# Patient Record
Sex: Female | Born: 1950 | Race: White | Hispanic: No | Smoking: Never smoker
Health system: Southern US, Community
[De-identification: ages and names within clinical notes are randomized; demographics above are authoritative.]

## PROBLEM LIST (undated history)

## (undated) DIAGNOSIS — C801 Malignant (primary) neoplasm, unspecified: Secondary | ICD-10-CM

## (undated) DIAGNOSIS — I700 Atherosclerosis of aorta: Secondary | ICD-10-CM

## (undated) DIAGNOSIS — Z9013 Acquired absence of bilateral breasts and nipples: Secondary | ICD-10-CM

## (undated) DIAGNOSIS — I10 Essential (primary) hypertension: Secondary | ICD-10-CM

## (undated) DIAGNOSIS — I639 Cerebral infarction, unspecified: Secondary | ICD-10-CM

## (undated) HISTORY — DX: Atherosclerosis of aorta: I70.0

## (undated) HISTORY — DX: Essential (primary) hypertension: I10

## (undated) HISTORY — PX: ABDOMINAL HYSTERECTOMY: SHX81

## (undated) HISTORY — PX: BREAST SURGERY: SHX581

## (undated) HISTORY — DX: Cerebral infarction, unspecified: I63.9

## (undated) HISTORY — DX: Acquired absence of bilateral breasts and nipples: Z90.13

## (undated) HISTORY — DX: Malignant (primary) neoplasm, unspecified: C80.1

---

## 1998-09-29 ENCOUNTER — Emergency Department (HOSPITAL_COMMUNITY): Admission: EM | Admit: 1998-09-29 | Discharge: 1998-09-29 | Payer: Self-pay | Admitting: Family Medicine

## 1999-03-28 ENCOUNTER — Ambulatory Visit (HOSPITAL_BASED_OUTPATIENT_CLINIC_OR_DEPARTMENT_OTHER): Admission: RE | Admit: 1999-03-28 | Discharge: 1999-03-28 | Payer: Self-pay | Admitting: General Surgery

## 1999-04-18 ENCOUNTER — Ambulatory Visit (HOSPITAL_COMMUNITY): Admission: RE | Admit: 1999-04-18 | Discharge: 1999-04-18 | Payer: Self-pay | Admitting: Oncology

## 1999-04-18 ENCOUNTER — Encounter: Payer: Self-pay | Admitting: Oncology

## 1999-04-25 ENCOUNTER — Encounter: Payer: Self-pay | Admitting: General Surgery

## 1999-04-25 ENCOUNTER — Ambulatory Visit (HOSPITAL_COMMUNITY): Admission: RE | Admit: 1999-04-25 | Discharge: 1999-04-25 | Payer: Self-pay | Admitting: General Surgery

## 1999-05-22 ENCOUNTER — Encounter: Payer: Self-pay | Admitting: Hematology and Oncology

## 1999-05-22 ENCOUNTER — Emergency Department (HOSPITAL_COMMUNITY): Admission: EM | Admit: 1999-05-22 | Discharge: 1999-05-22 | Payer: Self-pay | Admitting: Emergency Medicine

## 1999-06-21 ENCOUNTER — Encounter: Payer: Self-pay | Admitting: Oncology

## 1999-06-21 ENCOUNTER — Inpatient Hospital Stay (HOSPITAL_COMMUNITY): Admission: AD | Admit: 1999-06-21 | Discharge: 1999-06-27 | Payer: Self-pay | Admitting: Oncology

## 1999-06-23 ENCOUNTER — Encounter: Payer: Self-pay | Admitting: Oncology

## 1999-07-08 ENCOUNTER — Inpatient Hospital Stay (HOSPITAL_COMMUNITY): Admission: AD | Admit: 1999-07-08 | Discharge: 1999-07-11 | Payer: Self-pay | Admitting: *Deleted

## 1999-10-11 ENCOUNTER — Encounter: Admission: RE | Admit: 1999-10-11 | Discharge: 2000-01-09 | Payer: Self-pay | Admitting: *Deleted

## 1999-11-07 ENCOUNTER — Encounter: Admission: RE | Admit: 1999-11-07 | Discharge: 2000-02-05 | Payer: Self-pay | Admitting: *Deleted

## 2000-03-26 ENCOUNTER — Encounter: Admission: RE | Admit: 2000-03-26 | Discharge: 2000-06-15 | Payer: Self-pay | Admitting: Anesthesiology

## 2000-06-13 ENCOUNTER — Encounter: Payer: Self-pay | Admitting: Oncology

## 2000-06-13 ENCOUNTER — Encounter: Admission: RE | Admit: 2000-06-13 | Discharge: 2000-06-13 | Payer: Self-pay | Admitting: Oncology

## 2000-06-14 ENCOUNTER — Ambulatory Visit (HOSPITAL_BASED_OUTPATIENT_CLINIC_OR_DEPARTMENT_OTHER): Admission: RE | Admit: 2000-06-14 | Discharge: 2000-06-14 | Payer: Self-pay | Admitting: General Surgery

## 2000-06-15 ENCOUNTER — Encounter: Admission: RE | Admit: 2000-06-15 | Discharge: 2000-09-13 | Payer: Self-pay | Admitting: Anesthesiology

## 2000-09-11 ENCOUNTER — Inpatient Hospital Stay (HOSPITAL_COMMUNITY): Admission: AD | Admit: 2000-09-11 | Discharge: 2000-09-13 | Payer: Self-pay | Admitting: *Deleted

## 2000-09-11 ENCOUNTER — Encounter: Admission: RE | Admit: 2000-09-11 | Discharge: 2000-12-10 | Payer: Self-pay | Admitting: Anesthesiology

## 2000-10-02 ENCOUNTER — Encounter: Admission: RE | Admit: 2000-10-02 | Discharge: 2000-10-02 | Payer: Self-pay | Admitting: Oncology

## 2000-10-02 ENCOUNTER — Encounter: Payer: Self-pay | Admitting: Oncology

## 2001-01-04 ENCOUNTER — Encounter: Admission: RE | Admit: 2001-01-04 | Discharge: 2001-04-04 | Payer: Self-pay | Admitting: Anesthesiology

## 2001-03-05 ENCOUNTER — Encounter: Admission: RE | Admit: 2001-03-05 | Discharge: 2001-05-31 | Payer: Self-pay | Admitting: Oncology

## 2001-04-11 ENCOUNTER — Encounter: Admission: RE | Admit: 2001-04-11 | Discharge: 2001-07-10 | Payer: Self-pay | Admitting: Anesthesiology

## 2001-06-10 ENCOUNTER — Encounter: Payer: Self-pay | Admitting: Oncology

## 2001-06-10 ENCOUNTER — Ambulatory Visit (HOSPITAL_COMMUNITY): Admission: RE | Admit: 2001-06-10 | Discharge: 2001-06-10 | Payer: Self-pay | Admitting: Oncology

## 2001-06-24 ENCOUNTER — Encounter: Payer: Self-pay | Admitting: Oncology

## 2001-06-24 ENCOUNTER — Encounter: Admission: RE | Admit: 2001-06-24 | Discharge: 2001-06-24 | Payer: Self-pay | Admitting: Oncology

## 2001-08-30 ENCOUNTER — Encounter: Payer: Self-pay | Admitting: Oncology

## 2001-08-30 ENCOUNTER — Other Ambulatory Visit: Admission: RE | Admit: 2001-08-30 | Discharge: 2001-08-30 | Payer: Self-pay | Admitting: Oncology

## 2001-08-30 ENCOUNTER — Encounter: Admission: RE | Admit: 2001-08-30 | Discharge: 2001-08-30 | Payer: Self-pay | Admitting: Oncology

## 2001-09-05 ENCOUNTER — Encounter: Admission: RE | Admit: 2001-09-05 | Discharge: 2001-09-05 | Payer: Self-pay | Admitting: Oncology

## 2001-09-05 ENCOUNTER — Encounter: Payer: Self-pay | Admitting: Oncology

## 2001-09-16 ENCOUNTER — Encounter: Payer: Self-pay | Admitting: Oncology

## 2001-09-16 ENCOUNTER — Ambulatory Visit (HOSPITAL_COMMUNITY): Admission: RE | Admit: 2001-09-16 | Discharge: 2001-09-16 | Payer: Self-pay | Admitting: Oncology

## 2001-09-20 ENCOUNTER — Ambulatory Visit (HOSPITAL_COMMUNITY): Admission: RE | Admit: 2001-09-20 | Discharge: 2001-09-20 | Payer: Self-pay | Admitting: General Surgery

## 2001-09-20 ENCOUNTER — Encounter: Payer: Self-pay | Admitting: General Surgery

## 2001-10-13 ENCOUNTER — Inpatient Hospital Stay (HOSPITAL_COMMUNITY): Admission: EM | Admit: 2001-10-13 | Discharge: 2001-10-15 | Payer: Self-pay | Admitting: Emergency Medicine

## 2001-10-13 ENCOUNTER — Encounter: Payer: Self-pay | Admitting: Emergency Medicine

## 2001-11-14 ENCOUNTER — Ambulatory Visit (HOSPITAL_COMMUNITY): Admission: RE | Admit: 2001-11-14 | Discharge: 2001-11-14 | Payer: Self-pay | Admitting: Oncology

## 2001-11-14 ENCOUNTER — Encounter: Payer: Self-pay | Admitting: Oncology

## 2001-11-15 ENCOUNTER — Ambulatory Visit (HOSPITAL_BASED_OUTPATIENT_CLINIC_OR_DEPARTMENT_OTHER): Admission: RE | Admit: 2001-11-15 | Discharge: 2001-11-15 | Payer: Self-pay | Admitting: General Surgery

## 2001-12-13 ENCOUNTER — Encounter: Payer: Self-pay | Admitting: General Surgery

## 2001-12-13 ENCOUNTER — Ambulatory Visit (HOSPITAL_COMMUNITY): Admission: RE | Admit: 2001-12-13 | Discharge: 2001-12-13 | Payer: Self-pay | Admitting: General Surgery

## 2001-12-25 DIAGNOSIS — Z853 Personal history of malignant neoplasm of breast: Secondary | ICD-10-CM

## 2001-12-25 HISTORY — DX: Personal history of malignant neoplasm of breast: Z85.3

## 2002-01-13 ENCOUNTER — Ambulatory Visit (HOSPITAL_COMMUNITY): Admission: RE | Admit: 2002-01-13 | Discharge: 2002-01-13 | Payer: Self-pay | Admitting: Oncology

## 2002-01-13 ENCOUNTER — Encounter: Payer: Self-pay | Admitting: Oncology

## 2002-07-03 ENCOUNTER — Encounter: Payer: Self-pay | Admitting: Oncology

## 2002-07-03 ENCOUNTER — Ambulatory Visit (HOSPITAL_COMMUNITY): Admission: RE | Admit: 2002-07-03 | Discharge: 2002-07-03 | Payer: Self-pay | Admitting: Oncology

## 2002-10-22 ENCOUNTER — Ambulatory Visit (HOSPITAL_COMMUNITY): Admission: RE | Admit: 2002-10-22 | Discharge: 2002-10-22 | Payer: Self-pay | Admitting: Oncology

## 2002-10-22 ENCOUNTER — Encounter: Payer: Self-pay | Admitting: Oncology

## 2002-11-25 ENCOUNTER — Encounter: Payer: Self-pay | Admitting: Oncology

## 2002-11-25 ENCOUNTER — Ambulatory Visit (HOSPITAL_COMMUNITY): Admission: RE | Admit: 2002-11-25 | Discharge: 2002-11-25 | Payer: Self-pay | Admitting: Oncology

## 2003-01-01 ENCOUNTER — Encounter: Payer: Self-pay | Admitting: Oncology

## 2003-01-01 ENCOUNTER — Encounter: Admission: RE | Admit: 2003-01-01 | Discharge: 2003-01-01 | Payer: Self-pay | Admitting: Oncology

## 2003-01-02 ENCOUNTER — Encounter: Payer: Self-pay | Admitting: Oncology

## 2003-01-02 ENCOUNTER — Ambulatory Visit (HOSPITAL_COMMUNITY): Admission: RE | Admit: 2003-01-02 | Discharge: 2003-01-02 | Payer: Self-pay | Admitting: Oncology

## 2003-01-27 ENCOUNTER — Encounter: Payer: Self-pay | Admitting: Oncology

## 2003-01-27 ENCOUNTER — Ambulatory Visit (HOSPITAL_COMMUNITY): Admission: RE | Admit: 2003-01-27 | Discharge: 2003-01-27 | Payer: Self-pay | Admitting: Oncology

## 2003-02-13 ENCOUNTER — Ambulatory Visit: Admission: RE | Admit: 2003-02-13 | Discharge: 2003-03-09 | Payer: Self-pay | Admitting: *Deleted

## 2003-03-11 ENCOUNTER — Ambulatory Visit: Admission: RE | Admit: 2003-03-11 | Discharge: 2003-06-07 | Payer: Self-pay | Admitting: *Deleted

## 2003-10-25 ENCOUNTER — Emergency Department (HOSPITAL_COMMUNITY): Admission: EM | Admit: 2003-10-25 | Discharge: 2003-10-25 | Payer: Self-pay | Admitting: Emergency Medicine

## 2003-11-07 ENCOUNTER — Emergency Department (HOSPITAL_COMMUNITY): Admission: EM | Admit: 2003-11-07 | Discharge: 2003-11-07 | Payer: Self-pay | Admitting: Emergency Medicine

## 2003-12-11 ENCOUNTER — Inpatient Hospital Stay (HOSPITAL_COMMUNITY): Admission: EM | Admit: 2003-12-11 | Discharge: 2003-12-16 | Payer: Self-pay | Admitting: Oncology

## 2004-05-30 ENCOUNTER — Inpatient Hospital Stay (HOSPITAL_COMMUNITY): Admission: RE | Admit: 2004-05-30 | Discharge: 2004-06-04 | Payer: Self-pay | Admitting: Psychiatry

## 2004-11-13 ENCOUNTER — Ambulatory Visit: Payer: Self-pay | Admitting: Oncology

## 2005-03-17 ENCOUNTER — Ambulatory Visit: Payer: Self-pay | Admitting: Oncology

## 2005-06-05 ENCOUNTER — Ambulatory Visit: Payer: Self-pay | Admitting: Oncology

## 2005-06-26 ENCOUNTER — Ambulatory Visit (HOSPITAL_COMMUNITY): Admission: RE | Admit: 2005-06-26 | Discharge: 2005-06-26 | Payer: Self-pay | Admitting: Plastic Surgery

## 2005-06-26 ENCOUNTER — Ambulatory Visit (HOSPITAL_BASED_OUTPATIENT_CLINIC_OR_DEPARTMENT_OTHER): Admission: RE | Admit: 2005-06-26 | Discharge: 2005-06-27 | Payer: Self-pay | Admitting: Plastic Surgery

## 2005-09-20 ENCOUNTER — Ambulatory Visit: Payer: Self-pay | Admitting: Oncology

## 2005-09-22 ENCOUNTER — Emergency Department (HOSPITAL_COMMUNITY): Admission: EM | Admit: 2005-09-22 | Discharge: 2005-09-23 | Payer: Self-pay | Admitting: Emergency Medicine

## 2005-10-02 ENCOUNTER — Ambulatory Visit (HOSPITAL_COMMUNITY): Admission: RE | Admit: 2005-10-02 | Discharge: 2005-10-02 | Payer: Self-pay | Admitting: Oncology

## 2005-11-07 ENCOUNTER — Emergency Department (HOSPITAL_COMMUNITY): Admission: EM | Admit: 2005-11-07 | Discharge: 2005-11-07 | Payer: Self-pay | Admitting: Emergency Medicine

## 2005-11-09 ENCOUNTER — Inpatient Hospital Stay (HOSPITAL_COMMUNITY): Admission: AD | Admit: 2005-11-09 | Discharge: 2005-11-12 | Payer: Self-pay | Admitting: Internal Medicine

## 2005-11-10 ENCOUNTER — Ambulatory Visit: Payer: Self-pay | Admitting: Cardiology

## 2005-11-10 ENCOUNTER — Encounter: Payer: Self-pay | Admitting: Cardiology

## 2005-11-24 ENCOUNTER — Ambulatory Visit: Payer: Self-pay | Admitting: Oncology

## 2006-03-21 ENCOUNTER — Ambulatory Visit: Payer: Self-pay | Admitting: Oncology

## 2006-09-18 ENCOUNTER — Ambulatory Visit: Payer: Self-pay | Admitting: Oncology

## 2006-10-18 ENCOUNTER — Ambulatory Visit (HOSPITAL_COMMUNITY): Admission: RE | Admit: 2006-10-18 | Discharge: 2006-10-18 | Payer: Self-pay | Admitting: Oncology

## 2006-10-31 ENCOUNTER — Ambulatory Visit: Payer: Self-pay | Admitting: Oncology

## 2007-01-09 ENCOUNTER — Ambulatory Visit: Payer: Self-pay | Admitting: Oncology

## 2007-03-28 ENCOUNTER — Inpatient Hospital Stay (HOSPITAL_COMMUNITY): Admission: EM | Admit: 2007-03-28 | Discharge: 2007-03-31 | Payer: Self-pay | Admitting: Oncology

## 2007-03-28 ENCOUNTER — Ambulatory Visit: Payer: Self-pay | Admitting: Oncology

## 2007-04-04 ENCOUNTER — Ambulatory Visit: Payer: Self-pay | Admitting: Oncology

## 2007-04-04 LAB — CBC WITH DIFFERENTIAL/PLATELET
BASO%: 0.8 % (ref 0.0–2.0)
Basophils Absolute: 0 10*3/uL (ref 0.0–0.1)
EOS%: 4.7 % (ref 0.0–7.0)
HCT: 31 % — ABNORMAL LOW (ref 34.8–46.6)
HGB: 10.3 g/dL — ABNORMAL LOW (ref 11.6–15.9)
LYMPH%: 21.5 % (ref 14.0–48.0)
Platelets: 317 10*3/uL (ref 145–400)
lymph#: 1.1 10*3/uL (ref 0.9–3.3)

## 2007-04-04 LAB — CHCC SMEAR

## 2007-06-13 ENCOUNTER — Ambulatory Visit: Payer: Self-pay | Admitting: Oncology

## 2007-07-08 LAB — CBC & DIFF AND RETIC
BASO%: 0.5 % (ref 0.0–2.0)
Basophils Absolute: 0 10*3/uL (ref 0.0–0.1)
EOS%: 1.5 % (ref 0.0–7.0)
HGB: 11.2 g/dL — ABNORMAL LOW (ref 11.6–15.9)
LYMPH%: 16.6 % (ref 14.0–48.0)
MCH: 32.6 pg (ref 26.0–34.0)
MCHC: 33.5 g/dL (ref 32.0–36.0)
NEUT#: 4.7 10*3/uL (ref 1.5–6.5)
NEUT%: 76 % (ref 39.6–76.8)
RBC: 3.43 10*6/uL — ABNORMAL LOW (ref 3.70–5.32)
RDW: 14.8 % — ABNORMAL HIGH (ref 11.3–14.5)
WBC: 6.2 10*3/uL (ref 3.9–10.0)
lymph#: 1 10*3/uL (ref 0.9–3.3)

## 2007-07-08 LAB — VITAMIN B12: Vitamin B-12: 508 pg/mL (ref 211–911)

## 2007-07-08 LAB — RETICULOCYTES (CHCC)
ABS Retic: 43.7 10*3/uL (ref 19.0–186.0)
Retic Ct Pct: 1.2 % (ref 0.4–3.1)

## 2007-07-08 LAB — FERRITIN: Ferritin: 58 ng/mL (ref 10–291)

## 2007-08-01 ENCOUNTER — Inpatient Hospital Stay (HOSPITAL_COMMUNITY): Admission: EM | Admit: 2007-08-01 | Discharge: 2007-08-06 | Payer: Self-pay | Admitting: Emergency Medicine

## 2007-08-23 ENCOUNTER — Inpatient Hospital Stay (HOSPITAL_COMMUNITY): Admission: EM | Admit: 2007-08-23 | Discharge: 2007-08-28 | Payer: Self-pay | Admitting: Emergency Medicine

## 2007-08-27 ENCOUNTER — Encounter (INDEPENDENT_AMBULATORY_CARE_PROVIDER_SITE_OTHER): Payer: Self-pay | Admitting: *Deleted

## 2007-08-27 ENCOUNTER — Ambulatory Visit: Payer: Self-pay | Admitting: Internal Medicine

## 2007-09-03 ENCOUNTER — Encounter (INDEPENDENT_AMBULATORY_CARE_PROVIDER_SITE_OTHER): Payer: Self-pay | Admitting: Family Medicine

## 2007-09-03 ENCOUNTER — Ambulatory Visit: Payer: Self-pay | Admitting: Vascular Surgery

## 2007-09-03 ENCOUNTER — Ambulatory Visit: Admission: RE | Admit: 2007-09-03 | Discharge: 2007-09-03 | Payer: Self-pay | Admitting: Family Medicine

## 2007-09-19 ENCOUNTER — Ambulatory Visit: Payer: Self-pay | Admitting: Oncology

## 2007-11-08 ENCOUNTER — Ambulatory Visit: Payer: Self-pay | Admitting: Oncology

## 2008-01-23 ENCOUNTER — Ambulatory Visit: Payer: Self-pay | Admitting: Oncology

## 2011-05-09 NOTE — Discharge Summary (Signed)
NAMEFLORIENE, JESCHKE NO.:  [id number]   MEDICAL RECORD NO.:  [id number]          PATIENT TYPE:  INP   LOCATION:  1412                         FACILITY:  [facility]   PHYSICIAN:  Hettie Holstein, D.O.    DATE OF BIRTH:  1951/04/20   DATE OF ADMISSION:  08/23/2007  DATE OF DISCHARGE:  08/27/2007                               DISCHARGE SUMMARY   REFERRAL PHYSICIAN:  Pearson Grippe, M.D.   She has no primary care physician, though I am referring her to Dr.  Pearson Grippe of [facility].  She has an appointment with  Dr. Selena Batten on Friday, August 30, 2007, at 10:30 a.m.  She will be  followed by [facility] for home IV antibiotics.   FINAL DIAGNOSIS:  Right arm cellulitis, with good resolution on  inpatient course of antibiotics.   SECONDARY DIAGNOSES:  1. Methicillin-resistant Staphylococcus aureus bacteremia, with      suboptimal treatment course.  At this juncture, we are going to      complete a total of 28 days of therapy.  She has had a negative      transesophageal echocardiogram for valvular vegetations or evidence      of endocarditis, and we are recommending a total of 2 weeks of      Ancef to conclude on September 05, 2007, 1 gram IV q.8 h. and then      to conclude therapy with Levaquin 500 mg daily until September 19, 2007, and she should undergo subsequent clinical followup with her      new primary care physician with Dr. Pearson Grippe.  2. History of inflammatory breast cancer and bilateral mastectomies in      the past and multiple episodes of cellulitis and lymphedema, status      post mastectomy.   MEDICATIONS ON DISCHARGE:  Ms. Macdowell is instructed to resume her pre-  hospital medications including:  1. Colace 100 mg p.o. b.i.d. over-the-counter with instructions to      hold if she develops diarrhea.  2. She had been additionally on lisinopril 10 mg daily, which she can      also resume.  3. She was prescribed 56 tablets  of oxycodone by Dr. Brien Few on August 06, 2007, and she should follow up with her primary physician for all      subsequent narcotic refills.   STUDIES PERFORMED THIS ADMISSION:  She does have negative blood cultures  drawn on August 24, 2007.  So far to date sodium was 138, potassium 39,  BUN 7, creatinine 0.56.  CBC revealed a WBC of 3.2, hemoglobin 11.8,  platelet count 273,000.  Her TEE was as described above.  No evidence of  endocarditis.  Left ventricular fraction was estimated at 45% to 50%.  There was a possible PFO, but no shunt at rest.  Report was read by Dr.  Gala Romney.  This is the preliminary report.  The final report is  currently pending, and I have asked that this be followed up by new  primary care physician,  Dr. Selena Batten.   DISPOSITION:  Ms. Scadden is felt to be stable for discharge in the  morning when home IV antibiotics can be arranged.  She can continue her  home IV antibiotic course until September 05, 2007, to conclude with the  Levaquin on September 19, 2007.  I have asked that [place] follow her,  also have a basic metabolic panel checked 1 week following discharge.   HISTORY OF PRESENT ILLNESS:  For full details, please refer to the  history and physical.  However, briefly, Ms. Osorto is a 60 year old  female recently seen in the hospital with right arm cellulitis.  She has  a history of inflammatory breast cancer, status post chemotherapy and  radiation with a mastectomy.  She had had metastatic disease in her left  axilla and chest in 2002 requiring chemotherapy with left mastectomy and  lymph node dissection.  She has had recurrent right arm and chest wall  cellulitis with admissions in 2002, December 2004, November 2006, April  2008.  She has a history of chronic pain syndrome, status post total  abdominal hysterectomy, with bilateral salpingo-oophorectomy.  She has  had liposuction in August 2006, history of major depression, history of   benzodiazepine and opiate abuse dependence requiring inpatient  treatment, which has reportedly resolved.   Ms. Kysar had previously been hospitalized and treated for  cellulitis in early August 2008.  She was felt to have simple cellulitis  and treated with oral therapy.  Subsequently, however, her blood  cultures have revealed methicillin-resistant Staphylococcus aureus; and  due to concerns for Staph bacteremia, appropriate course of IV  antibiotics for at least 14 days and rule out endocarditis was pursued.  She is felt to be medically stable at this time, and she has been set up  with an outpatient primary care physician.      Hettie Holstein, D.O.  Electronically Signed     ESS/MEDQ  D:  08/27/2007  T:  08/27/2007  Job:  [id number]   cc:   Massie Maroon, MD  Fax: [phone]   Leighton Roach. Truett Perna, M.D.  Fax: [phone]

## 2011-05-09 NOTE — Discharge Summary (Signed)
Connie Meyer, PARTRIDGE NO.:  [id number]   MEDICAL RECORD NO.:  [id number]          PATIENT TYPE:  INP   LOCATION:  1318                         FACILITY:  [facility]   PHYSICIAN:  Isidor Holts, M.D.  DATE OF BIRTH:  July 25, 1951   DATE OF ADMISSION:  07/31/2007  DATE OF DISCHARGE:  08/06/2007                               DISCHARGE SUMMARY   PRIMARY CARE PHYSICIAN:  Unassigned.   DISCHARGE DIAGNOSES:  1. Recurrent right upper extremity cellulitis, secondary to      methicillin sensitive Staphylococcus aureus.  2. History of breast cancer, status post bilateral mastectomy.  3. Chronic pain syndrome.  4. Normocytic anemia.  5. History of depression.  6. Hypertension.   DISCHARGE MEDICATIONS:  1. Keflex 500 mg p.o. twice daily for 10 days only.  2. Oxycodone 5 mg, two pills p.o. q.6h. p.r.n., for one week only.  A      total of #56 pills have been dispensed.  3. Lisinopril 10 mg p.o. daily.  4. Colace 100 mg p.o. twice daily, over-the-counter.   PROCEDURE:  None.   CONSULTATIONS:  None.   HISTORY:  As per the H&P notes of July 31, 2007, dictated by Dr.  Lonia Blood.  In brief, this 60 year old female with a known  history of inflammatory breast cancer of the right breast in April 2000,  status post chemotherapy and radiation therapy with a mastectomy, with  subsequent metastatic disease to the left axilla and chest in 2002,  requiring chemotherapy with a left mastectomy and left node dissection,  recurrent right arm and chest wall cellulitis with multiple previous  admissions for the same, chronic pain syndrome, chronic normocytic  anemia, previous abdominal hysterectomy and bilateral salpingo-  oophorectomy, history of major depression with a prior history of  diazepam and opioid abuse/dependence, who presents with a one-day  history of rapidly-advancing redness and some swelling in the right  upper extremity.  She was admitted for further evaluation,  investigation  and management.   CLINICAL COURSE:  #1 - RIGHT UPPER EXTREMITY CELLULITIS:  This is  recurrent, and has in the past, been due to Streptococcus.  She was  therefore commenced on broad-spectrum antibiotic coverage with a  combination of Vancomycin and Rocephin.  The white cell count was  elevated at 12,000.  Her temperature was 101.5 degrees.  The patient  demonstrated a dramatic clinical response to antibiotic treatment, and  by August 02, 2007, only minimal redness remained, although there was  still some residual soreness and swelling.  Blood cultures were of  course sent off and subsequently grew methicillin-sensitive  Staphylococcus aureus.  We were therefore able to transition the patient  to monotherapy with Rocephin, with continued clinical improvement.  By  August 06, 2007, only minimal residual inflammatory phenomena remained.  The patient was therefore switched to oral Keflex for an anticipated  further 10- day course of antibiotic treatment.   #2 - HYPERTENSION:  The patient has had borderline elevated blood  pressures during the course of this hospitalization, ranging between 147  systolic, to 159.  She has no prior known history of hypertension.  She  has been therefore recommended a heart-healthy diet and commenced on low-  dose Lisinopril.  She has been recommended to establish followup with a  primary MD for monitoring of blood pressure, as well as routine  preventative care.   #3 - HISTORY OF CHRONIC PAIN SYNDROME:  There were no symptoms referable  to this, during the course of the patient's hospitalization.   #4 - HISTORY OF DEPRESSION:  The patient mood remained stable, during  the course of her hospitalization.   #5 - HISTORY OF NORMOCYTIC ANEMIA:  The patient's hemoglobin remained  stable at about 9.4, during the course of this hospitalization.   DISPOSITION:  The patient was considered sufficiently recovered  clinically to be discharged on  August 06, 2007, to complete a further 10-  day course of oral antibiotic treatment.  She did have problems with  constipation during this hospitalization, secondary to opioid  medication, but this has been addressed with laxatives and Fleet's  enema.   DISCHARGE INSTRUCTIONS:  She is recommended to return to work on August 12, 2007.   DIET:  Heart-healthy diet.   ACTIVITY:  As tolerated.   FOLLOW-UP INSTRUCTIONS:  The patient is recommended to establish  followup with a primary MD for routine and preventative care.  All of  this has been communicated to the patient.  She has verbalized  understanding.      Isidor Holts, M.D.  Electronically Signed     CO/MEDQ  D:  08/06/2007  T:  08/06/2007  Job:  [id number]

## 2011-05-09 NOTE — H&P (Signed)
Connie Meyer, Connie Meyer NO.:  [id number]   MEDICAL RECORD NO.:  [id number]          PATIENT TYPE:  INP   LOCATION:  1412                         [facility]ACILITY:  [facility]   PHYSICIAN:  Altha Harm, MDDATE OF BIRTH:  March 27, 1951   DATE OF ADMISSION:  08/23/2007  DATE OF DISCHARGE:                              HISTORY & PHYSICAL   CHIEF COMPLAINT:  Swelling, warmth and redness in the right upper  extremity.   HISTORY OF PRESENT ILLNESS:  This is a 60 year old lady who is status  post bilateral mastectomies for inflammatory breast cancer and has a  history of recurrent right upper extremity edema syndrome, who presents  to the emergency room with complaints of warmth, swelling and erythema.  According to the patient, due to her previous diagnoses of cellulitis in  the past, she was very concerned that this was a recurring cellulitis  and came to the emergency room.  The patient was recently discharged  from the hospital on August 06, 2007.  She states that since her  discharge from the hospital her right upper extremity never really  resolved completely and then started having warmth, swelling and pain  again.  The patient states that she has had some tactile fevers,  however, they are no measured temperatures.  In the emergency room the  patient had no fevers.  The patient has not recently taken any  antipyretics.  The patient's description of her initial presentation is  very vague and nonspecific.  However, she states that she had some  ciprofloxacin which has been prescribed for her approximately one month  ago and she took 1500 mg last night to try to abate the symptoms and  then subsequent to that started having some emesis.  The patient has had  no further emesis since her episode last night.  We are asked to see the  patient for admission.  Confounding the picture is that the patient does  not have a primary care physician who follows her and it is unclear as  to whether or not her prior episodes were truly cellulitis or whether  they were just arm edema syndrome in a patient status post mastectomy.  The patient, on her last admission, did have a cellulitis with  documented blood culture of MSSA and the only other positive documented  blood culture was in 2004 when she had Strep agalactiae.  The patient  has had multiple hospitalizations in the interim with diagnosis of  cellulitis, however, she has had no positive cultures.  In light of the  fact that the patient does have a history of breast cancer, and the  patient does not have a primary care physician, and she does have  findings of mild warmth, erythema and redness, I am admitting the  patient on an observation basis.  The patient has received a dose of  Unasyn while in the emergency room and blood cultures have been drawn.  However, I am going to de-escalate her therapy and actually put her on  her old antibiotic and observe her over the next 24 hours. If the  patient has defervescence of  her symptoms, it is very reasonable that  the patient can be sent out on an oral antibiotic for a reasonable  course of therapy and then have followup with a primary care physician  of her choice.   PAST MEDICAL HISTORY:  1. Significant for inflammatory breast cancer and for bilateral      mastectomies in the past.  2. Multiple episodes of cellulitis versus edema syndrome in a patient      status post mastectomy.   FAMILY HISTORY:  Nonsignificant.   SOCIAL HISTORY:  The patient is married.  She lives with her children.  She does have  a history in the remote past of dependency upon narcotics  which she states she went through a detox process.   She also has a history of chronic normocytic anemia.  She has chronic  pain syndrome.  History of major depression.  History of benzodiazepine  and opioid abuse/dependence requiring inpatient treatment and  detoxification.   PAST SURGICAL HISTORY:  She is  status post total abdominal hysterectomy  with bilateral salpingo-oophorectomy.  Status post tonsillectomy and  adenoidectomy.  Status post liposuction in August of 2006.   CURRENT MEDICATIONS:  None.   ALLERGIES:  No known drug allergies.   PRIMARY CARE PHYSICIAN:  None at this time.   REVIEW OF SYSTEMS:  The patient denies any headache, any dizziness, any  blurry vision, any loss of consciousness, seizure activity.  She denies  any difficulty with eating, any difficulty with swallowing, any mouth  pain.  Patient denies any shortness of breath, cough, difficulty in  breathing.  She denies any chest pain, palpitations, except as noted in  the HPI, associated with the swelling and redness of the upper  extremities.  She denies any abdominal pain, any diarrhea, however, as  noted in the HPI, she does have some nausea and vomiting.  The patient  denies any dysuria, frequency, urgency.  She denies any weakness or  focal problems.  All other systems are negative except as noted in the  HPI.   LABORATORY DATA:  In the emergency room the patient received a dose of  Unasyn and blood cultures were sent.  Subsequent to that the patient had  a CBC which showed a white blood cell count of 3.2, hemoglobin 11.8,  hematocrit of 34.6, platelet count of 273,000.  Sodium was 138,  potassium 3.9, chloride 106, bicarb 27, BUN 7, creatinine 0.56.   PHYSICAL EXAMINATION:  GENERAL APPEARANCE:  The patient is lying in bed  in no acute distress.  VITAL SIGNS: Her temperature is 97.9.  Heart rate is 82.  Blood pressure  153/92.  Respiratory rate 13.  Oxygen saturation 99% on room air.  HEENT:  She is normocephalic, atraumatic.  No icterus.  No conjunctival  pallor.  Pupils equal, round, reactive to light and accommodation.  Extraocular movements are intact.  Oropharynx is moist.  No exudates,  erythema or lesions are noted.  NECK:  Trachea is midline.  No masses, no thyromegaly, no JVD, no  carotid  bruits.  LYMPH NODES SURVEY:  The patient has no cervical, axillary, inguinal  lymphadenopathy noted.  LUNG:  On exam she has equal excursion bilaterally.  Lungs are clear to  auscultation.  No increased vocal fremitus.  CHEST:  Chest wall shows evidence of bilateral mastectomies.  CARDIOVASCULAR:  She has normal S1 and S2.  There are no murmurs, rubs  or gallops noted.  PMI is not displaced.  No heaves or thrills  on  palpation.  ABDOMEN:  Mildly obese.  Normal, active bowel sounds.  Soft, nontender,  nondistended.  No masses, no hepatosplenomegaly.   ASSESSMENT/PLAN:  This is a patient who presents with right upper  extremity edema and pain.  The patient has only very minimal erythema as  noted.  It is unclear as to whether or not this is truly cellulitis or  just a right upper extremity arm edema syndrome status post mastectomy.  Since the patient has no primary care physician with which to follow up,  we will bring the patient in on an observation basis.  The pt had MSSA  on her last admission and it it unclear if this was fully and  appropriatly treated thus the pt may warrant TEE adn IV antibiotics if  in fact this was not done on last admission. This information cannot be  obtained at this time and eill leave this up to the treating Physician  to prusue. At this time I do not think that the patient's degree of  illness warrants IV antibiotics and I will place the patient on oral  antibiotics and observe her over the next 24 hours.  If the issue of  appropriate treatment of  MSSA is resolved andn  the patient has a de-escalation of symptoms, she  can certainly be discharged on an oral antibiotic.  If she does have an  escalation of symptoms, then at that time the therapy can be escalated  to IV commensurate with her clinical course.      Altha Harm, MD  Electronically Signed     MAM/MEDQ  D:  08/23/2007  T:  08/24/2007  Job:  [id number]

## 2011-05-09 NOTE — H&P (Signed)
Connie Meyer, Connie Meyer NO.:  [id number]   MEDICAL RECORD NO.:  [id number]          PATIENT TYPE:  INP   LOCATION:  0102                         FACILITY:  [facility]   PHYSICIAN:  Lonia Blood, M.D.DATE OF BIRTH:  1951/08/20   DATE OF ADMISSION:  07/31/2007  DATE OF DISCHARGE:                              HISTORY & PHYSICAL   PRIMARY CARE PHYSICIAN:  Unassigned.   CHIEF COMPLAINT:  Right arm redness.   HISTORY OF PRESENT ILLNESS:  [name] is a very pleasant 60-  year-old female with a history of bilateral mastectomy secondary to  breast cancer with metastatic recurrence.  She has a history of multiple  prior admissions for right arm and chest wall cellulitis/lymphangitis.  She states that she has been in her usual state of health.  She does  recall playing with her dog in the last few days and possibly being  scratched in her right arm.  Today at approximately 3 in the afternoon,  she experienced a rapid onset of right upper extremity erythema at or  around the elbow and spreading up the arm onto the chest wall.  With  this was an associated subjective sense of fever and nausea.  There has  been no vomiting.  There have been no other symptoms whatsoever.  Given  the patient's history, she was concerned that this likely represented a  recurrence of cellulitis and presented to a local urgent care center, at  which time she was transferred to [facility] emergency room.   REVIEW OF SYSTEMS:  Comprehensive review of systems is unremarkable with  the exception to the positive elements noted in the history of present  illness above.   PAST MEDICAL HISTORY:  1. Breast cancer.      a.     Inflammatory breast cancer, right breast, April, 2000,       status post chemo and radiation with mastectomy.      b.     Metastatic disease, left axilla and chest in 2002 requiring       chemotherapy with a left mastectomy and lymph node dissection.  2. Recurrent  right arm and chest wall cellulitis with admissions in      October, 2002, December, 2004, November, 2006, and April, 2008.  3. Chronic pain syndrome.  4. Chronic normocytic anemia.  5. Status post total abdominal hysterectomy with bilateral salpingo-      oophorectomy.  6. Status post tonsillectomy and adenoidectomy.  7. Status post liposuction in August, 2006.  8. History of major depression.  9. History of benzodiazepine and opiate abuse/dependence requiring      inpatient treatment and reportedly resolved.   OUTPATIENT MEDICATIONS:  None.   ALLERGIES:  No known drug allergies.   FAMILY HISTORY:  Patient's father has died due to complications from  COPD and coronary artery disease.  Patient's mother is alive and  healthy.   SOCIAL HISTORY:  Patient is married.  She lives in [place].  She does  not smoke.  She has four children.  She occasionally partakes of  alcohol.   DATA REVIEWED:  Hemoglobin is 11.3  with an MCV of 97.  White count is  elevated at 12,000.  Platelet count is normal.  Sodium is low at 131.  Electrolytes are otherwise normal.  LFTs are normal.  Albumin is 3.8.  Blood culture is pending.   PHYSICAL EXAMINATION:  Temperature 101.5, blood pressure 142/88, heart  rate 112, respiratory rate 21.  O2 sat is 98% on room air.  GENERAL:  A well-developed and well-nourished female in no acute  respiratory distress who is somewhat lethargic from narcotic pain  medications.  HEENT:  Normocephalic and atraumatic.  Pupils are equal, round and  reactive to light and accommodation.  Extraocular muscles are intact  bilaterally.  OC/OP clear.  LUNGS:  Clear to auscultation bilaterally without wheezes or rhonchi.  NECK:  No JVD.  No lymphadenopathy.  CARDIOVASCULAR:  Regular rate and rhythm without murmur, rub or gallop  with normal S1 and S2.  ABDOMEN:  Nontender, nondistended.  Soft.  Bowel sounds present.  No  hepatosplenomegaly.  No rebound.  No ascites.   EXTREMITIES:  No significant clubbing, cyanosis or edema in the  bilateral lower extremities.  CUTANEOUS:  There is a deep erythematous discoloration with associated  calor of the right upper extremity at the elbow up across the shoulder  circumferentially, spreading onto the right chest wall.  There is no  obvious skin tear or abscess or discharge of any type.  NEUROLOGIC:  Patient is lethargic from pain medications and antiemetics.  She is able to move all four extremities spontaneously.  Cranial nerves  II-XII appear to be intact bilaterally.   IMPRESSION/PLAN:  1. Recurrent right arm and chest wall cellulitis:  Patient has had a      confirmed Group B strep cellulitis in the past.  As per her usual      routine, I will treat her empirically with vancomycin in addition      to broad-spectrum antibiotic, using Rocephin.  We will follow her      blood cultures.  We will follow her clinical course very closely.  2. Chronic normocytic anemia:  Patient's hemoglobin appears to be at      its baseline.  No further workup will be carried out at the present      time, as it appears that this has been evaluated extensively in the      past through the heme/onc clinic.  3. Mild hyponatremia:  This is likely secondary to decreased p.o.      intake over the last 24 hours.  Will hydrate the patient gently      with isotonic saline and follow her sodium closely.      Lonia Blood, M.D.  Electronically Signed     JTM/MEDQ  D:  08/01/2007  T:  08/01/2007  Job:  [id number]

## 2011-05-12 NOTE — Consult Note (Signed)
[facility]  Patient:    Connie Meyer, Connie Meyer                    MRN: [id number] Proc. Date: 05/17/00 Attending:  Thyra Breed, M.D. CC:         Leighton Roach. Truett Perna, M.D.             Marinda Elk, M.D.                          Consultation Report  FOLLOW-UP EVALUATION  HISTORY:  Shamere comes in for follow-up evaluation after having her OxyContin adjusted.  She is currently taking OxyContin 20 mg 3 times a day.  She notes that this helps tremendously.  She also continues on her Neurontin and Elavil. She feels somewhat increased fatigue.  This has been more prominent since she has gone up on the OxyContin, but she takes the OxyContin at 6 in the morning, 12 noon, and at about 4-5 in the evening.  I discussed with her the need to spread this out more.  She complains of a lot of arthralgias which are most pronounced in the mornings.  She denies any rashes, oral ulcers, pleurisy, or pericarditis.  Her chest discomfort has improved, as well as her perineal discomfort on the current regimen.  PHYSICAL EXAMINATION:  VITAL SIGNS:  Blood pressure 132/63, heart rate 95, respiratory rate 16.  O2 saturation is 99%.  Pain level is 3/10.  HEENT:  Oropharynx is free of oral ulcerations or lesions.  SKIN:  Free of active eruptions.  EXTREMITIES:  Joints demonstrated no active synovitis, with no tenderness to palpation.  IMPRESSION:  1. Arthralgias, improved.  2. 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  3. Perineal pain characterized by ___________.  4. Macrocytic anemia per Dr. Truett Perna.  DISPOSITION:  1. Continue on OxyContin 20 mg 1 p.o. t.i.d., #90 with no refill.  2. Increase Neurontin to 400 mg t.i.d., #100 with 6 refills.  3. Add ibuprofen 800 mg 1 p.o. t.i.d., #100 with 6 refills.  4. Follow up with me in 4-6 weeks.  I advised the patient that her fatigue may improve if she spreads her OxyContin out to  every 8 hours rather than taking it so clustered together. In addition, it likely is coming from the OxyContin and will likely improve as she continues on the medication and develops tolerance to the side effect. DD:  05/17/00 TD:  05/17/00 Job: [id number]

## 2011-05-12 NOTE — Op Note (Signed)
Connie Meyer, Connie Meyer NO.:  [id number]   MEDICAL RECORD NO.:  [id number]          PATIENT TYPE:  AMB   LOCATION:  DSC                          FACILITY:  [facility]   PHYSICIAN:  Mary Contogiannis, M.D.DATE OF BIRTH:  06-20-1951   DATE OF PROCEDURE:  06/26/2005  DATE OF DISCHARGE:                                 OPERATIVE REPORT   PREOPERATIVE DIAGNOSES:  1.  Lipodystrophy of the abdomen and flank.  2.  Deepening nasolabial folds and marionette lines bilaterally.   POSTOPERATIVE DIAGNOSES:  1.  Lipodystrophy of the abdomen and flank.  2.  Deepening nasolabial folds and marionette lines bilaterally.   PROCEDURE:  1.  Liposuction of the abdomen and flank.  2.  Restylane injections into the bilateral nasolabial folds and marionette      lines.   ATTENDING SURGEON:  Brantley Persons, M.D.   ANESTHESIA:  General endotracheal.   ANESTHESIOLOGIST:  Judie Petit, M.D.   ESTIMATED BLOOD LOSS:  300 cc.   FLUIDS REPLACED:  Crystalloid 3000 cc.   URINE OUTPUT:  500 cc.   COMPLICATIONS:  None.   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  She presents  to undergo liposuction of those areas, as she has been on a diet and  exercise program and has been losing some weight but has not been able to  lose these certain areas.  Additionally, the patient has deep nasolabial  folds that she would like to see an improved appearance of as well as  deepening marionette lines.  She would like to undergo Restylane injections  into these areas of her face.  The liposuction has been discussed in detail  with the patient's oncologist, and he feels that she is currently an  appropriate candidate for that and gives her medical clearance for the  procedure.   PROCEDURE:  The patient was marked in the preop holding area for the areas  of liposuction as well as on her face for the areas  of Restylane injection.  She was then taken back to the OR and prepped circumferentially from the  level of the breast down to the knees using Betadine.  The patient was then  placed down on sterile sheets and covered with sterile drapes on the OR  table.  General endotracheal anesthesia was obtained.  The patient's groin  was then reprepped and a Foley catheter placed.  The rest of the patient's  abdomen and flanks were then sterilely draped.  Stab incisions were made in  the suprapubic area in the site of a previous hysterectomy incision as well  as a small stab incision on the superior aspect of the umbilicus, where  tumescent fluid was then infused in the amounts noted on the abdomen and  anterior flank areas.  While waiting for the tumescent fluid to work at  least for 20 minutes to provide good hemostasis, I then made a stab incision  in the right flank area that we had previously marked out with the patient  while awake.  Through this stab  incision, I then infused more tumescent  fluid, as noted on the nursing notes.  At this point, I then proceeded with  liposuction of the abdomen and anterior flanks using the power-assisted  liposuction.  Liposuction was performed until an appropriate amount was  removed, and there was a nice, even, thin pinch test.  I then performed a  small stab incision in the left flank area, as we had previously marked this  with the patient while awake.  Tumescent fluid was then infused into the  left flank area, as noted on the nurse's notes.  I next performed power-  assisted liposuction of the rest of the right flank area.  After this had  been performed, she appeared to have a nice, smooth contour in the flank  area, and the excess fatty tissue had been removed.  At this point, the  tumescent fluid had worked enough in the left flank, so I then proceeded  with the power-assisted liposuction of the remainder of the left flank area.  Final touch-ups were  performed on the abdomen as well as the flank areas, as  appropriate.  The patient appeared to have an appropriate amount of the  fatty deposits removed as well as nice, even, thin pinch test throughout the  areas of liposuction.  The contours appeared to be nice and blended with her  other body areas.  At this point, all of the stab incisions were then  debrided as appropriate and closed using 4-0 Monocryl on the dermal layer  followed by 4-0 Monocryl on the intracuticular suture.  These sutures are  dressed with Steri-Strips.  The patient was then placed into a compression  girdle with 4x4s over the incision and Rest-On foam over the areas of  liposuction, being careful to place the adherent, sticky side away from the  patient's skin.  At this point, the Restylane was then injected into the  bilateral nasolabial folds and marionette lines.  The Restylane (Lot [id number],  exp 12/08) was injected for a total of 2 cc (two of the 1 ml vials) into the  bilateral nasolabial folds and marionette lines without complications.  The  Restylane was then massaged into the soft tissues.  At this point, the  patient was then awakened from the general anesthesia.  The total amount of  liposuction performed was 4900 cc, as noted on the nurse's notes.  There  were no complications.  The patient tolerated the procedure well.  The final  needle and sponge count reports were correct at the end of the case.  Since  this volume was very close to the 5 liter mark, I discussed this with the  patient's husband and felt that it would be appropriate for her to stay  overnight in the RCC as a precautionary measure.  He agreed with me, so we  will plan to keep the patient overnight in the observation stay unit.  I  also discussed this with the patient in the recovery room, and she is aware  that she will be staying overnight.  Again, this is just a precautionary measure, as she should do fine and be discharged home in the  morning without  any problems.  Discharge is planned for the morning.       MC/MEDQ  D:  06/26/2005  T:  06/26/2005  Job:  [id number]

## 2011-05-12 NOTE — Procedures (Signed)
[facility]  Patient:    Connie Meyer, Connie Meyer                    MRN: [id number] Proc. Date: 04/23/00 Attending:  Thyra Breed, M.D. CC:         Marinda Elk, M.D.             Leighton Roach. Truett Perna, M.D.                           Procedure Report  FOLLOW-UP EVALUATION:  Victorino Dike comes in for follow-up evaluation of her right chest wall discomfort and arthralgias.  Since her last evaluation, the patient noted that when she is taking the OxyContin that she would break through about six hours after starting.  She occasionally took a third tablet and noted that this helped significantly to reduce her discomfort.  She ran out of these over the weekend.  Her pain level was up to 10/10.  She continues on the Neurontin and is having no untoward effects from this.  She continues with her Elavil.  The patient continues to have morning stiffness which lasts over an hour.  She has no swelling of her joints but stiffness predominantly in the small joints of the hands and feet.  She denied rashes, or ulcers, Reynauds, pleurisy or any obvious hematuria.  Her laboratory investigations revealed a white count of 3000 with a hemoglobin of 10.6 with MCV of 103.4.  Sed rate was 24.  ANA titer was 1:80 in a speckled pattern.  IMPRESSION: 1. Arthralgias with borderline positive ANA and leukopenia. 2. Right-sided chest discomfort most likely related to a postmastectomy pain    syndrome. 3. Macrocytic anemia per Dr. Truett Perna. 4. Status post chemotherapy and radiation therapy for breast cancer.  DISPOSITION: 1. Increase OxyContin to 10 mg 1 p.o. t.i.d. #90 with no refill. 2. Increase Neurontin to 300 mg t.i.d. #100 with two refills. 3. Advise the patient of her laboratory changes and the fact that they were    very borderline at best with regard to the ANA.  I advised her that we    probably needed to repeat these in three to six months to see whether there    was any  trend it may be related to her previous chemotherapy more than    anything else.  She seems to do well with the OxyContin.  I plan to see her    back in follow-up in four weeks.  DD:  04/23/00 TD:  04/24/00 Job: [id number]

## 2011-10-06 LAB — CBC
MCV: 97.5
RBC: 3.56 — ABNORMAL LOW
RDW: 14.1 — ABNORMAL HIGH
WBC: 3.2 — ABNORMAL LOW

## 2011-10-06 LAB — CULTURE, BLOOD (ROUTINE X 2): Culture: NO GROWTH

## 2011-10-06 LAB — DIFFERENTIAL
Lymphs Abs: 1.1
Neutrophils Relative %: 56

## 2011-10-06 LAB — BASIC METABOLIC PANEL
GFR calc Af Amer: >60
Glucose, Bld: 105 — ABNORMAL HIGH
Sodium: 138

## 2011-10-09 LAB — CBC
HCT: 27.6 — ABNORMAL LOW
HCT: 28.9 — ABNORMAL LOW
Hemoglobin: 9.4 — ABNORMAL LOW
Hemoglobin: 9.8 — ABNORMAL LOW
MCHC: 33.9
MCHC: 34
MCHC: 34.2
MCV: 97.9
MCV: 98
MCV: 98.1
Platelets: 270
RBC: 2.82 — ABNORMAL LOW
RBC: 2.95 — ABNORMAL LOW
RDW: 14.4 — ABNORMAL HIGH
RDW: 14.5 — ABNORMAL HIGH
WBC: 7.1

## 2011-10-09 LAB — COMPREHENSIVE METABOLIC PANEL
ALT: 11
AST: 17
Albumin: 3.8
BUN: 18
CO2: 23
Chloride: 99
Creatinine, Ser: 0.85
GFR calc Af Amer: >60
Glucose, Bld: 110 — ABNORMAL HIGH
Potassium: 4

## 2011-10-09 LAB — BASIC METABOLIC PANEL
BUN: 6
CO2: 26
CO2: 26
CO2: 29
Calcium: 8.3 — ABNORMAL LOW
Calcium: 8.6
Chloride: 104
Chloride: 107
Chloride: 98
Creatinine, Ser: 0.72
Creatinine, Ser: 0.87
GFR calc Af Amer: >60
GFR calc Af Amer: >60
Glucose, Bld: 109 — ABNORMAL HIGH
Glucose, Bld: 84
Potassium: 3.5
Sodium: 138

## 2011-10-09 LAB — CULTURE, BLOOD (ROUTINE X 2)

## 2011-10-09 LAB — DIFFERENTIAL
Eosinophils Absolute: 0
Lymphs Abs: 0.4 — ABNORMAL LOW
Monocytes Absolute: 0.2

## 2014-05-20 DIAGNOSIS — M62838 Other muscle spasm: Secondary | ICD-10-CM | POA: Diagnosis not present

## 2014-05-20 DIAGNOSIS — L905 Scar conditions and fibrosis of skin: Secondary | ICD-10-CM | POA: Insufficient documentation

## 2014-05-20 DIAGNOSIS — IMO0002 Reserved for concepts with insufficient information to code with codable children: Secondary | ICD-10-CM | POA: Insufficient documentation

## 2014-05-20 DIAGNOSIS — Z7189 Other specified counseling: Secondary | ICD-10-CM | POA: Diagnosis not present

## 2014-05-20 DIAGNOSIS — M6240 Contracture of muscle, unspecified site: Secondary | ICD-10-CM | POA: Insufficient documentation

## 2015-12-08 DIAGNOSIS — M25551 Pain in right hip: Secondary | ICD-10-CM | POA: Diagnosis not present

## 2015-12-08 DIAGNOSIS — M79672 Pain in left foot: Secondary | ICD-10-CM | POA: Diagnosis not present

## 2015-12-08 DIAGNOSIS — M25572 Pain in left ankle and joints of left foot: Secondary | ICD-10-CM | POA: Diagnosis not present

## 2015-12-08 DIAGNOSIS — M25472 Effusion, left ankle: Secondary | ICD-10-CM | POA: Diagnosis not present

## 2016-03-17 ENCOUNTER — Ambulatory Visit (INDEPENDENT_AMBULATORY_CARE_PROVIDER_SITE_OTHER): Payer: Medicare Other | Admitting: Internal Medicine

## 2016-03-17 VITALS — BP 152/98 | HR 83 | Temp 97.6°F | Resp 16 | Ht 65.5 in | Wt 147.0 lb

## 2016-03-17 DIAGNOSIS — R030 Elevated blood-pressure reading, without diagnosis of hypertension: Secondary | ICD-10-CM | POA: Diagnosis not present

## 2016-03-17 DIAGNOSIS — Z853 Personal history of malignant neoplasm of breast: Secondary | ICD-10-CM | POA: Diagnosis not present

## 2016-03-17 DIAGNOSIS — R1032 Left lower quadrant pain: Secondary | ICD-10-CM

## 2016-03-17 DIAGNOSIS — IMO0001 Reserved for inherently not codable concepts without codable children: Secondary | ICD-10-CM

## 2016-03-17 MED ORDER — CIPROFLOXACIN HCL 500 MG PO TABS: 500.0000 mg | ORAL_TABLET | Freq: Two times a day (BID) | ORAL | Status: DC

## 2016-03-17 MED ORDER — METRONIDAZOLE 500 MG PO TABS: 500.0000 mg | ORAL_TABLET | Freq: Three times a day (TID) | ORAL | Status: DC

## 2016-03-17 NOTE — Progress Notes (Signed)
Subjective:  By signing my name below, I, Connie Meyer, attest that this documentation has been prepared under the direction and in the presence of Connie Lin, MD. Electronically Signed: [name], [place]. 03/17/2016 , 12:40 PM .  Patient was seen in Room 10 . 1st [facility] OV.   Patient ID: Connie Meyer, female    DOB: 1951/04/28, 65 y.o.   MRN: [id number] Chief Complaint  Patient presents with  . Diverticulitis  . Abdominal Pain    progressively worsening   HPI Connie Meyer is a 65 y.o. female who presents to [facility] complaining of progressively worsening abdominal pain over last 2-3 weeks. She was diagnosed with diverticulitis years ago and it felt like this. She's been having multiple bowel movements a day, mostly diarrhea. She denies nausea, fever, Meyer in stool, urinary symptoms, or night sweats. She denies recent antibiotic use. She denies ever having colonoscopy.   Her last medical check up was in 2003 for breast cancer; Dr [name]. she just stopped going.   There are no active problems to display for this patient.  No current outpatient prescriptions on file.  Review of Systems  Constitutional: Negative for fever, chills and diaphoresis.  Gastrointestinal: Positive for abdominal pain and diarrhea. Negative for nausea, vomiting, constipation, Meyer in stool and abdominal distention.  Genitourinary: Negative for dysuria, urgency, hematuria and enuresis.      Objective:   Physical Exam  Constitutional: She is oriented to person, place, and time. She appears well-developed and well-nourished. No distress.  HENT:  Head: Normocephalic and atraumatic.  Eyes: EOM are normal. Pupils are equal, round, and reactive to light.  Neck: Neck supple.  Cardiovascular: Normal rate, regular rhythm and normal heart sounds.   Pulmonary/Chest: Effort normal. No respiratory distress.  Abdominal: Bowel sounds are normal. She exhibits no mass. There is no hepatomegaly.  There is tenderness in the left lower quadrant. There is no rebound.  Musculoskeletal: Normal range of motion.  Neurological: She is alert and oriented to person, place, and time.  Skin: Skin is warm and dry.  Psychiatric: She has a normal mood and affect. Her behavior is normal.  Nursing note and vitals reviewed.  BP 152/98 mmHg  Pulse 83  Temp(Src) 97.6 F (36.4 C) (Oral)  Resp 16  Ht 5' 5.5" (1.664 m)  Wt 147 lb (66.679 kg)  BMI 24.08 kg/m2  SpO2 98%    Assessment & Plan:  I have completed the patient encounter in its entirety as documented by the scribe, with editing by me where necessary. Connie Meyer P. Connie Meyer, M.D.  Abdominal pain, LLQ--?diverticulitis  Elevated BP  History of breast cancer in female-2000, 2003  She has the understanding that if her breast cancer comes back as metastasis, she will be untreatable and so she basically dropped out of follow-up care in 2005 and hasn't been seen since. She  Has had diverticulitis in past which felt like this. I stressed to her that without a colonoscopy in recent years we cannot rule out an underlying malignancy (she's never had a colonoscopy). We discussed all the potential health maintenance issues that would help person of her age. She prefers do nothing in terms of Meyer work or x-rays today. She agrees to return in 7-10 days if antibiotics are not effective for her left lower quadrant pain. She agrees to my referral to PCP that can organize her healthcare and I have given her names for follow-up.  Meds ordered this encounter  Medications  . metroNIDAZOLE (FLAGYL)  500 MG tablet    Sig: Take 1 tablet (500 mg total) by mouth 3 (three) times daily.    Dispense:  21 tablet    Refill:  0  . ciprofloxacin (CIPRO) 500 MG tablet    Sig: Take 1 tablet (500 mg total) by mouth 2 (two) times daily.    Dispense:  20 tablet    Refill:  0

## 2016-03-17 NOTE — Patient Instructions (Signed)
     IF you received an x-ray today, you will receive an invoice from [facility]. Please contact [facility] at [phone] with questions or concerns regarding your invoice.   IF you received labwork today, you will receive an invoice from [facility]. Please contact [facility] at [phone] with questions or concerns regarding your invoice.   Our billing staff will not be able to assist you with questions regarding bills from these companies.  You will be contacted with the lab results as soon as they are available. The fastest way to get your results is to activate your My Chart account. Instructions are located on the last page of this paperwork. If you have not heard from us regarding the results in 2 weeks, please contact this office.    , 

## 2016-03-27 ENCOUNTER — Ambulatory Visit (INDEPENDENT_AMBULATORY_CARE_PROVIDER_SITE_OTHER): Payer: Medicare Other | Admitting: Family Medicine

## 2016-03-27 ENCOUNTER — Ambulatory Visit (INDEPENDENT_AMBULATORY_CARE_PROVIDER_SITE_OTHER): Payer: Medicare Other

## 2016-03-27 ENCOUNTER — Encounter: Payer: Self-pay | Admitting: Family Medicine

## 2016-03-27 VITALS — BP 145/80 | HR 77 | Temp 97.6°F | Resp 16 | Ht 65.5 in | Wt 147.0 lb

## 2016-03-27 DIAGNOSIS — R1032 Left lower quadrant pain: Secondary | ICD-10-CM | POA: Diagnosis not present

## 2016-03-27 DIAGNOSIS — R109 Unspecified abdominal pain: Secondary | ICD-10-CM

## 2016-03-27 LAB — POCT CBC
Granulocyte percent: 63 %G (ref 37–80)
HCT, POC: 36 % — AB (ref 37.7–47.9)
Hemoglobin: 12.5 g/dL (ref 12.2–16.2)
Lymph, poc: 1.1 (ref 0.6–3.4)
MCH, POC: 32.8 pg — AB (ref 27–31.2)
MCHC: 34.8 g/dL (ref 31.8–35.4)
MCV: 94.1 fL (ref 80–97)
MID (cbc): 0.4 (ref 0–0.9)
MPV: 6.6 fL (ref 0–99.8)
POC Granulocyte: 2.5 (ref 2–6.9)
POC LYMPH PERCENT: 27.8 %L (ref 10–50)
POC MID %: 9.2 %M (ref 0–12)
Platelet Count, POC: 207 10*3/uL (ref 142–424)
RBC: 3.83 M/uL — AB (ref 4.04–5.48)
RDW, POC: 13.6 %
WBC: 4 10*3/uL — AB (ref 4.6–10.2)

## 2016-03-27 MED ORDER — POLYETHYLENE GLYCOL 3350 17 GM/SCOOP PO POWD: 17.0000 g | Freq: Two times a day (BID) | ORAL | Status: DC | PRN

## 2016-03-27 MED ORDER — PREDNISONE 20 MG PO TABS: ORAL_TABLET | ORAL | Status: DC

## 2016-03-27 NOTE — Patient Instructions (Addendum)
You have a large amount of stool in the left side that needs to be moved out. Please pickup some MiraLAX and take it twice a day for the next 3 days.  In addition you have moderate scoliosis suggestive of a back strain. I'm going to give her some medicine to see if that will relieve the muscular aspect of this problem. The blood tests rule out active diverticulitis.  I will refer you for a colonoscopy because he really should have this done    IF you received an x-ray today, you will receive an invoice from [facility]. Please contact [facility] at [phone] with questions or concerns regarding your invoice.   IF you received labwork today, you will receive an invoice from [employer]. Please contact [facility] at [phone] with questions or concerns regarding your invoice.   Our billing staff will not be able to assist you with questions regarding bills from these companies.  You will be contacted with the lab results as soon as they are available. The fastest way to get your results is to activate your My Chart account. Instructions are located on the last page of this paperwork. If you have not heard from [place] regarding the results in 2 weeks, please contact this office.

## 2016-03-27 NOTE — Progress Notes (Signed)
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  She is very active.  Pain is much worse after walking her grandson, and better when supine.  No weakness or numbness in left leg.  No nausea, vomiting or fever  She took her antibiotics with no improvement  Never had a colonoscopy.  She sees Dr. [name].  Objective:  NAD, cheerful but concerned because she is a cancer survivor. Chest:  Clear Heart:  Reg, no murmur Abdomen:  Soft with no guarding or rebound, no mass felt, mildly tender LLQ with deep palpation SLR is positive on the left. Left knee crossover results in flank pain Results for orders placed or performed in visit on 03/27/16  POCT CBC  Result Value Ref Range   WBC 4.0 (A) 4.6 - 10.2 K/uL   Lymph, poc 1.1 0.6 - 3.4   POC LYMPH PERCENT 27.8 10 - 50 %L   MID (cbc) 0.4 0 - 0.9   POC MID % 9.2 0 - 12 %M   POC Granulocyte 2.5 2 - 6.9   Granulocyte percent 63.0 37 - 80 %G   RBC 3.83 (A) 4.04 - 5.48 M/uL   Hemoglobin 12.5 12.2 - 16.2 g/dL   HCT, POC 36.0 (A) 37.7 - 47.9 %   MCV 94.1 80 - 97 fL   MCH, POC 32.8 (A) 27 - 31.2 pg   MCHC 34.8 31.8 - 35.4 g/dL   RDW, POC 13.6 %   Platelet Count, POC 207 142 - 424 K/uL   MPV 6.6 0 - 99.8 fL   Three-way of the abdomen shows scoliosis and heavy stool burden. No acute obstruction seen  LLQ abdominal pain - Plan: DG Abd Acute W/Chest, POCT CBC, Ambulatory referral to Gastroenterology, polyethylene glycol powder (GLYCOLAX/MIRALAX) powder  Left flank pain - Plan: predniSONE (DELTASONE) 20 MG tablet   You have a large amount of stool in the left side that needs to be moved out. Please pickup some MiraLAX and take it twice a day for the next 3 days.  In addition you have moderate scoliosis suggestive of a back strain. I'm going to give her some medicine to see if that will relieve the muscular aspect of this problem. The blood tests rule  out active diverticulitis.  I will refer you for a colonoscopy because he really should have this done  Robyn Haber, Md

## 2016-03-31 ENCOUNTER — Telehealth: Payer: Self-pay

## 2016-03-31 NOTE — Telephone Encounter (Signed)
Patient was referred to Dr. Lorie Apley office for gastroenterology and they are not taking medicare patient.  [phone]

## 2016-03-31 NOTE — Telephone Encounter (Signed)
Will send referral to [facility] instead of Dr Lorie Apley office.

## 2016-04-12 ENCOUNTER — Other Ambulatory Visit: Payer: Self-pay

## 2016-04-12 DIAGNOSIS — R1032 Left lower quadrant pain: Secondary | ICD-10-CM

## 2016-04-17 DIAGNOSIS — R1032 Left lower quadrant pain: Secondary | ICD-10-CM | POA: Diagnosis not present

## 2016-04-17 DIAGNOSIS — M545 Low back pain: Secondary | ICD-10-CM | POA: Diagnosis not present

## 2016-04-17 DIAGNOSIS — Z1211 Encounter for screening for malignant neoplasm of colon: Secondary | ICD-10-CM | POA: Diagnosis not present

## 2016-05-15 ENCOUNTER — Other Ambulatory Visit: Payer: Self-pay | Admitting: Gastroenterology

## 2016-05-15 DIAGNOSIS — R1032 Left lower quadrant pain: Secondary | ICD-10-CM | POA: Diagnosis not present

## 2016-05-15 DIAGNOSIS — M545 Low back pain: Secondary | ICD-10-CM | POA: Diagnosis not present

## 2016-05-19 ENCOUNTER — Ambulatory Visit
Admission: RE | Admit: 2016-05-19 | Discharge: 2016-05-19 | Disposition: A | Discharge status: Home / Self Care | Payer: Medicare Other | Source: Ambulatory Visit | Attending: Gastroenterology | Admitting: Gastroenterology

## 2016-05-19 DIAGNOSIS — R1032 Left lower quadrant pain: Secondary | ICD-10-CM

## 2016-05-25 DIAGNOSIS — R1032 Left lower quadrant pain: Secondary | ICD-10-CM | POA: Diagnosis not present

## 2016-05-25 DIAGNOSIS — K5730 Diverticulosis of large intestine without perforation or abscess without bleeding: Secondary | ICD-10-CM | POA: Diagnosis not present

## 2016-05-25 DIAGNOSIS — Z1211 Encounter for screening for malignant neoplasm of colon: Secondary | ICD-10-CM | POA: Diagnosis not present

## 2016-05-25 LAB — HM COLONOSCOPY

## 2016-07-24 DIAGNOSIS — R159 Full incontinence of feces: Secondary | ICD-10-CM | POA: Diagnosis not present

## 2016-08-21 DIAGNOSIS — R1310 Dysphagia, unspecified: Secondary | ICD-10-CM | POA: Diagnosis not present

## 2016-08-21 DIAGNOSIS — R159 Full incontinence of feces: Secondary | ICD-10-CM | POA: Diagnosis not present

## 2016-08-24 DIAGNOSIS — K449 Diaphragmatic hernia without obstruction or gangrene: Secondary | ICD-10-CM | POA: Diagnosis not present

## 2016-08-24 DIAGNOSIS — R1310 Dysphagia, unspecified: Secondary | ICD-10-CM | POA: Diagnosis not present

## 2016-08-24 DIAGNOSIS — K2990 Gastroduodenitis, unspecified, without bleeding: Secondary | ICD-10-CM | POA: Diagnosis not present

## 2016-08-24 DIAGNOSIS — K2970 Gastritis, unspecified, without bleeding: Secondary | ICD-10-CM | POA: Diagnosis not present

## 2016-08-24 DIAGNOSIS — K222 Esophageal obstruction: Secondary | ICD-10-CM | POA: Diagnosis not present

## 2016-08-24 DIAGNOSIS — K319 Disease of stomach and duodenum, unspecified: Secondary | ICD-10-CM | POA: Diagnosis not present

## 2016-08-30 ENCOUNTER — Ambulatory Visit (INDEPENDENT_AMBULATORY_CARE_PROVIDER_SITE_OTHER): Payer: Medicare Other

## 2016-08-30 ENCOUNTER — Ambulatory Visit (INDEPENDENT_AMBULATORY_CARE_PROVIDER_SITE_OTHER): Payer: Medicare Other | Admitting: Family Medicine

## 2016-08-30 VITALS — BP 122/72 | HR 87 | Temp 98.1°F | Resp 17 | Ht 65.5 in | Wt 143.0 lb

## 2016-08-30 DIAGNOSIS — R0781 Pleurodynia: Secondary | ICD-10-CM

## 2016-08-30 DIAGNOSIS — R079 Chest pain, unspecified: Secondary | ICD-10-CM | POA: Diagnosis not present

## 2016-08-30 DIAGNOSIS — S20212A Contusion of left front wall of thorax, initial encounter: Secondary | ICD-10-CM | POA: Diagnosis not present

## 2016-08-30 DIAGNOSIS — S299XXA Unspecified injury of thorax, initial encounter: Secondary | ICD-10-CM | POA: Diagnosis not present

## 2016-08-30 MED ORDER — TRAMADOL HCL 50 MG PO TABS: 50.0000 mg | ORAL_TABLET | Freq: Three times a day (TID) | ORAL | 1 refills | Status: DC | PRN

## 2016-08-30 NOTE — Progress Notes (Signed)
Connie Meyer is a 65 y.o. female who presents to Urgent Care today for Left-sided rib pain:  1.  Rib pain:  Patient was going down a spiral slide with her grandson last Thursday, about 1 week ago.  It was raining and she slipped at the ned and couldn't catch herself. Her left side connected sharply with the support pole in the center of the slide.  Had immediate pain.  Has been mostly just lying in bed since then due to pain.  Difficulty raising arms above her head.  Pain with deep breaths.  Gradually began feeling better yesterday and today, and therefore tried cleaning her house.  She was vacuuming and felt a "catch" in her side at the same side where she hit her ribs and had immediate pain again.  Due to this pain, she presented to [facility] today.  No cough.  No fevers or chills.  Can take a deep breath if needed, but doesn't want to secondary to pain.    ROS as above.    PMH reviewed. Patient is a nonsmoker.   Past Medical History:  Diagnosis Date  . Degenerative disc disease   . HA (headache)   . Neck pain    Past Surgical History:  Procedure Laterality Date  . None      Medications reviewed. Current Outpatient Prescriptions  Medication Sig Dispense Refill  . ibuprofen (ADVIL,MOTRIN) 800 MG tablet Take 1 tablet (800 mg total) by mouth 3 (three) times daily. 21 tablet 0  . gabapentin (NEURONTIN) 300 MG capsule Take 1 capsule (300 mg total) by mouth 3 (three) times daily. (Patient not taking: Reported on 08/30/2016) 270 capsule 1   No current facility-administered medications for this visit.      Physical Exam:  BP 122/74 (BP Location: Right Arm, Patient Position: Sitting, Cuff Size: Normal)   Pulse 62   Temp 98.2 F (36.8 C) (Oral)   Resp 17   Ht 6' 0.5" (1.842 m)   Wt 234 lb (106.1 kg)   SpO2 97%   BMI 31.30 kg/m  Gen:  Alert, cooperative patient who appears stated age in no acute distress.  Vital signs reviewed. HEENT: EOMI,  MMM Pulm:  Clear to auscultation  bilaterally with good air movement.  No wheezes or rales noted.   Cardiac:  Regular rate and rhythm without murmur auscultated.  Good S1/S2. Chest:  TTP along ribs 5 and 6 mid-axillary line.  Tenderness under Left breast to about mid-clavicular line.  No bruising noted.  Does have scars on bilateral thoracic pain secondary to mastectomy repair.   Back:  Muscle spasm and tenderness noted Left mid-thoracic area.   Abd:  Soft/nondistended/nontender.   Exts: Non edematous BL  LE, warm and well perfused.   Assessment and Plan:  1.  Rib pain: - likely bruised rib, no definitive fracture.  No step-off on my exam - Recommended rib splint.  Tramadol for relief.  Advil for mild pain.  - discussed general course of bruised ribs.  Warning precautions provided.  - no evidence of pneumothorax.  She should blow bubbles, pinwheels, etc to prevent atelectasis/PNA.

## 2016-08-30 NOTE — Patient Instructions (Addendum)
You have a bruised rib.  The chest xray didn't show a definitive fracture.  Take the Tramadol 1-2 pills for pain relief.  You can take Advil for mild pain.  Get the splint!  Come back if you're getting worse, or not better after several weeks.  It was good to meet you today!    IF you received an x-ray today, you will receive an invoice from [facility]. Please contact [facility] at [phone] with questions or concerns regarding your invoice.   IF you received labwork today, you will receive an invoice from [employer]. Please contact [facility] at [phone] with questions or concerns regarding your invoice.   Our billing staff will not be able to assist you with questions regarding bills from these companies.  You will be contacted with the lab results as soon as they are available. The fastest way to get your results is to activate your My Chart account. Instructions are located on the last page of this paperwork. If you have not heard from [place] regarding the results in 2 weeks, please contact this office.    We recommend that you schedule a mammogram for breast cancer screening. Typically, you do not need a referral to do this. Please contact a local imaging center to schedule your mammogram.  [facility] - [phone]  *ask for the Radiology Department [facility] - [phone] or [phone]  [facility] - [phone] [place] [phone] [facility] - [phone]  *ask for the [facility] - [phone]  *ask for the Radiology Department [facility] - [phone]  *ask for the [facility] - [phone]

## 2016-09-05 ENCOUNTER — Encounter: Payer: Self-pay | Admitting: Physician Assistant

## 2016-09-05 DIAGNOSIS — K449 Diaphragmatic hernia without obstruction or gangrene: Secondary | ICD-10-CM | POA: Insufficient documentation

## 2016-09-18 ENCOUNTER — Ambulatory Visit (INDEPENDENT_AMBULATORY_CARE_PROVIDER_SITE_OTHER): Payer: Medicare Other | Admitting: Emergency Medicine

## 2016-09-18 ENCOUNTER — Telehealth: Payer: Self-pay | Admitting: Emergency Medicine

## 2016-09-18 VITALS — BP 122/72 | HR 88 | Temp 97.5°F | Resp 17 | Ht 65.5 in | Wt 140.0 lb

## 2016-09-18 DIAGNOSIS — R0781 Pleurodynia: Secondary | ICD-10-CM

## 2016-09-18 MED ORDER — TRAMADOL HCL 50 MG PO TABS: 50.0000 mg | ORAL_TABLET | Freq: Three times a day (TID) | ORAL | 1 refills | Status: DC | PRN

## 2016-09-18 NOTE — Telephone Encounter (Signed)
Pt given scheduled appointment for CT Chest/ Thoracic spine this Friday @ 1130am. Need to arrive at 1115am.  Gave imaging number if she needs to reschedule

## 2016-09-18 NOTE — Assessment & Plan Note (Addendum)
Rib injury that is concerning as she has a history of breast cancer and the rib pain is beneath the area of her surgical reconstruction.  - CT chest and thoracic spine - refill tramadol  - she will f/u in one week to determine the next route in management.

## 2016-09-18 NOTE — Patient Instructions (Addendum)
Thank you for coming in,   Please follow up in one week.     Please feel free to call with any questions or concerns at any time, at [phone]. --Dr. Raeford Razor     IF you received an x-ray today, you will receive an invoice from [facility]. Please contact [facility] at [phone] with questions or concerns regarding your invoice.   IF you received labwork today, you will receive an invoice from [employer]. Please contact [facility] at [phone] with questions or concerns regarding your invoice.   Our billing staff will not be able to assist you with questions regarding bills from these companies.  You will be contacted with the lab results as soon as they are available. The fastest way to get your results is to activate your My Chart account. Instructions are located on the last page of this paperwork. If you have not heard from [place] regarding the results in 2 weeks, please contact this office.

## 2016-09-18 NOTE — Progress Notes (Signed)
[facility] [address] [place] [address] [phone]  Date:  09/18/2016   Name:  Connie Meyer   DOB:  [birth year]   MRN:  [id number]  PCP:  No primary care provider on file.    Chief Complaint: Rib Pain   History of Present Illness:  This is a 65 y.o. female who is presenting with left side rib pain. She was seen on 9/6 with a rib injury. She had x-rays that were negative. She has tried the rib splint but it causes pain. She has taken tramadol for pain.  She has a history of breast cancer 15 years ago and has had double mastectomy. She had her surgery initially performed at [facility] but the final surgery was done in [place], [place]. She denies wanting to have any intervention of she were to have breast cancer again. She doesn't follow up with a surgeon in regards to her breast cancer. She reports that the tramadol is helping her pain.   Review of Systems:  ROS: No unexpected weight loss, fever, chills, swelling, instability, numbness/tingling, redness, otherwise see HPI   PMH: inflammatory breast cancer  PShx: double mastectomy and reconstruction  PSx: denies tobacco use    Physical Examination: Physical Exam BP 122/72 (BP Location: Right Arm, Patient Position: Sitting, Cuff Size: Normal)   Pulse 88   Temp 97.5 F (36.4 C) (Oral)   Resp 17   Ht 5' 5.5" (1.664 m)   Wt 140 lb (63.5 kg)   SpO2 100%   BMI 22.94 kg/m   Gen: NAD, alert, cooperative with exam,  HEENT: NCAT, EOMI, clear conjunctiva, supple neck CV: RRR, good S1/S2, no murmur, no edema, capillary refill brisk  Resp: CTABL, no wheezes, non-labored Skin: no rashes, normal turgor  Neuro: no gross deficits.  Psych: alert and oriented MSK: well healed surgical scars on the thoracic region  tenderness along the costovertebral junction beneath the surgical scar on the left.  Pain along the rib to the anterior portion of the chest.  Obvious dimple on the left compared to the other  thoracic ribs.   Assessment and Plan:  Rib pain on left side Rib injury that is concerning as she has a history of breast cancer and the rib pain is beneath the area of her surgical reconstruction.  - CT chest and thoracic spine - refill tramadol  - she will f/u in one week to determine the next route in management.

## 2016-09-20 DIAGNOSIS — R1310 Dysphagia, unspecified: Secondary | ICD-10-CM | POA: Diagnosis not present

## 2016-09-20 DIAGNOSIS — K449 Diaphragmatic hernia without obstruction or gangrene: Secondary | ICD-10-CM | POA: Diagnosis not present

## 2016-09-20 DIAGNOSIS — K222 Esophageal obstruction: Secondary | ICD-10-CM | POA: Diagnosis not present

## 2016-09-22 ENCOUNTER — Ambulatory Visit (HOSPITAL_COMMUNITY)
Admission: RE | Admit: 2016-09-22 | Discharge: 2016-09-22 | Disposition: A | Discharge status: Home / Self Care | Payer: Medicare Other | Source: Ambulatory Visit | Attending: Family Medicine | Admitting: Family Medicine

## 2016-09-22 DIAGNOSIS — I313 Pericardial effusion (noninflammatory): Secondary | ICD-10-CM | POA: Insufficient documentation

## 2016-09-22 DIAGNOSIS — M546 Pain in thoracic spine: Secondary | ICD-10-CM | POA: Diagnosis not present

## 2016-09-22 DIAGNOSIS — X58XXXA Exposure to other specified factors, initial encounter: Secondary | ICD-10-CM | POA: Insufficient documentation

## 2016-09-22 DIAGNOSIS — I700 Atherosclerosis of aorta: Secondary | ICD-10-CM | POA: Insufficient documentation

## 2016-09-22 DIAGNOSIS — S2242XA Multiple fractures of ribs, left side, initial encounter for closed fracture: Secondary | ICD-10-CM | POA: Diagnosis not present

## 2016-09-22 DIAGNOSIS — R0781 Pleurodynia: Secondary | ICD-10-CM | POA: Diagnosis present

## 2016-09-22 DIAGNOSIS — M4184 Other forms of scoliosis, thoracic region: Secondary | ICD-10-CM | POA: Diagnosis not present

## 2016-09-22 DIAGNOSIS — S2232XA Fracture of one rib, left side, initial encounter for closed fracture: Secondary | ICD-10-CM | POA: Diagnosis not present

## 2016-09-22 DIAGNOSIS — J984 Other disorders of lung: Secondary | ICD-10-CM | POA: Diagnosis not present

## 2016-09-26 ENCOUNTER — Telehealth: Payer: Self-pay | Admitting: Family Medicine

## 2016-09-26 NOTE — Telephone Encounter (Signed)
Left VM for patient. If she calls back please have her speak with a nurse/CMA and inform that her CT is showing that she has healing rib fractures of the 7-10th ribs. It is important for her to follow up so we can make sure that we obtaining adequate pain control and she doesn't develop any complications such as pneumonia.     If any questions then please take the best time and phone number to call and I will try to call her back.   Rosemarie Ax, MD PGY-4, [facility] Fellow  09/26/2016, 5:04 PM

## 2016-10-25 ENCOUNTER — Encounter: Payer: Self-pay | Admitting: Family Medicine

## 2017-02-05 DIAGNOSIS — H52221 Regular astigmatism, right eye: Secondary | ICD-10-CM | POA: Diagnosis not present

## 2017-02-05 DIAGNOSIS — H2513 Age-related nuclear cataract, bilateral: Secondary | ICD-10-CM | POA: Diagnosis not present

## 2017-02-05 DIAGNOSIS — H5201 Hypermetropia, right eye: Secondary | ICD-10-CM | POA: Diagnosis not present

## 2017-02-05 DIAGNOSIS — H524 Presbyopia: Secondary | ICD-10-CM | POA: Diagnosis not present

## 2017-02-05 DIAGNOSIS — H31092 Other chorioretinal scars, left eye: Secondary | ICD-10-CM | POA: Diagnosis not present

## 2017-02-05 DIAGNOSIS — H5202 Hypermetropia, left eye: Secondary | ICD-10-CM | POA: Diagnosis not present

## 2017-07-18 ENCOUNTER — Encounter (HOSPITAL_COMMUNITY): Payer: Self-pay | Admitting: Emergency Medicine

## 2017-07-18 ENCOUNTER — Emergency Department (HOSPITAL_COMMUNITY)
Admission: EM | Admit: 2017-07-18 | Discharge: 2017-07-18 | Disposition: A | Discharge status: Home / Self Care | Payer: Medicare Other | Attending: Emergency Medicine | Admitting: Emergency Medicine

## 2017-07-18 DIAGNOSIS — Z853 Personal history of malignant neoplasm of breast: Secondary | ICD-10-CM | POA: Diagnosis not present

## 2017-07-18 DIAGNOSIS — L03113 Cellulitis of right upper limb: Secondary | ICD-10-CM | POA: Insufficient documentation

## 2017-07-18 LAB — COMPREHENSIVE METABOLIC PANEL
ALK PHOS: 65 U/L (ref 38–126)
ALT: 17 U/L (ref 14–54)
ANION GAP: 8 (ref 5–15)
AST: 20 U/L (ref 15–41)
Albumin: 4.4 g/dL (ref 3.5–5.0)
BILIRUBIN TOTAL: 0.9 mg/dL (ref 0.3–1.2)
BUN: 16 mg/dL (ref 6–20)
CALCIUM: 9.1 mg/dL (ref 8.9–10.3)
CO2: 26 mmol/L (ref 22–32)
CREATININE: 0.91 mg/dL (ref 0.44–1.00)
Chloride: 103 mmol/L (ref 101–111)
Glucose, Bld: 99 mg/dL (ref 65–99)
Potassium: 3.4 mmol/L — ABNORMAL LOW (ref 3.5–5.1)
Sodium: 137 mmol/L (ref 135–145)
TOTAL PROTEIN: 7.9 g/dL (ref 6.5–8.1)

## 2017-07-18 LAB — CBC WITH DIFFERENTIAL/PLATELET
BASOS ABS: 0 10*3/uL (ref 0.0–0.1)
BASOS PCT: 0 %
EOS ABS: 0 10*3/uL (ref 0.0–0.7)
Eosinophils Relative: 0 %
HCT: 36.9 % (ref 36.0–46.0)
HEMOGLOBIN: 12.2 g/dL (ref 12.0–15.0)
Lymphocytes Relative: 12 %
Lymphs Abs: 1.1 10*3/uL (ref 0.7–4.0)
MCH: 32.2 pg (ref 26.0–34.0)
MCHC: 33.1 g/dL (ref 30.0–36.0)
MCV: 97.4 fL (ref 78.0–100.0)
MONOS PCT: 5 %
Monocytes Absolute: 0.4 10*3/uL (ref 0.1–1.0)
NEUTROS ABS: 7 10*3/uL (ref 1.7–7.7)
NEUTROS PCT: 83 %
Platelets: 186 10*3/uL (ref 150–400)
RBC: 3.79 MIL/uL — ABNORMAL LOW (ref 3.87–5.11)
RDW: 13.1 % (ref 11.5–15.5)
WBC: 8.5 10*3/uL (ref 4.0–10.5)

## 2017-07-18 LAB — URINALYSIS, ROUTINE W REFLEX MICROSCOPIC
BILIRUBIN URINE: NEGATIVE
Bacteria, UA: NONE SEEN
GLUCOSE, UA: NEGATIVE mg/dL
Ketones, ur: 5 mg/dL — AB
LEUKOCYTES UA: NEGATIVE
NITRITE: NEGATIVE
Protein, ur: NEGATIVE mg/dL
Specific Gravity, Urine: 1.013 (ref 1.005–1.030)
pH: 6 (ref 5.0–8.0)

## 2017-07-18 LAB — I-STAT CG4 LACTIC ACID, ED
Lactic Acid, Venous: 0.65 mmol/L (ref 0.5–1.9)
Lactic Acid, Venous: 0.33 mmol/L — ABNORMAL LOW (ref 0.5–1.9)

## 2017-07-18 MED ORDER — DOXYCYCLINE HYCLATE 100 MG PO TABS
100.0000 mg | ORAL_TABLET | Freq: Once | ORAL | Status: AC
  Administered 2017-07-18: 100 mg via ORAL
  Filled 2017-07-18: qty 1

## 2017-07-18 MED ORDER — ONDANSETRON 4 MG PO TBDP
4.0000 mg | ORAL_TABLET | Freq: Once | ORAL | Status: AC | PRN
  Administered 2017-07-18: 4 mg via ORAL
  Filled 2017-07-18: qty 1

## 2017-07-18 MED ORDER — OXYCODONE-ACETAMINOPHEN 5-325 MG PO TABS: 1.0000 | ORAL_TABLET | ORAL | 0 refills | Status: DC | PRN

## 2017-07-18 MED ORDER — OXYCODONE-ACETAMINOPHEN 5-325 MG PO TABS
1.0000 | ORAL_TABLET | Freq: Once | ORAL | Status: AC
  Administered 2017-07-18: 1 via ORAL
  Filled 2017-07-18: qty 1

## 2017-07-18 MED ORDER — DOXYCYCLINE HYCLATE 100 MG PO CAPS: 100.0000 mg | ORAL_CAPSULE | Freq: Two times a day (BID) | ORAL | 0 refills | Status: DC

## 2017-07-18 NOTE — ED Notes (Signed)
Bed: WA05 Expected date:  Expected time:  Means of arrival:  Comments: 

## 2017-07-18 NOTE — ED Triage Notes (Signed)
Pt states that she has a developing cellulitis on her R side where she has lympadema from breast CA. States she has had this before 5-6 years ago. Alert and oriented. Afebrile

## 2017-07-18 NOTE — Discharge Instructions (Signed)
RETURN TO ER IF YOU HAVE WORSENING SYMPTOMS DESPITE ANTIBIOTICS, OR IF YOU DEVELOP FEVER, CHEST PAIN, OR SHORTNESS OF BREATH.

## 2017-07-18 NOTE — ED Provider Notes (Signed)
[facility] DEPT Provider Note   CSN: [id number] Arrival date & time: 07/18/17  1024     History   Chief Complaint Chief Complaint  Patient presents with  . Cellulitis    HPI Connie Meyer is a 66 y.o. female.  66 year old female with distant history of breast cancer currently in remission who presents with right arm pain and swelling. Yesterday, the patient began having redness and swelling of her right arm with progressively worsening pain. She has had cellulitis in this arm 5-6 years ago and this feels the same. She denies any trauma, scratches, or insect bites. The pain has become more severe and now radiates into her right shoulder, up her neck and into her head. She felt chilled yesterday with generalized malaise and nausea but she did not measure her temperature. She denies any vomiting or urinary symptoms. Her right arm is chronically more swollen than her left because of previous lymph node dissection but the swelling is worse today than usual. No history of blood clots.   The history is provided by the patient.    Past Medical History:  Diagnosis Date  . Cancer [facility])     Patient Active Problem List   Diagnosis Date Noted  . Rib pain on left side 09/18/2016  . Hiatal hernia 09/05/2016  . History of breast cancer in female-2000, 2003 03/17/2016    Past Surgical History:  Procedure Laterality Date  . BREAST SURGERY      OB History    No data available       Home Medications    Prior to Admission medications   Medication Sig Start Date End Date Taking? Authorizing Provider  doxycycline (VIBRAMYCIN) 100 MG capsule Take 1 capsule (100 mg total) by mouth 2 (two) times daily. 07/18/17   Alizia Greif, Wenda Overland, MD  oxyCODONE-acetaminophen (PERCOCET) 5-325 MG tablet Take 1 tablet by mouth every 4 (four) hours as needed. 07/18/17   Johnice Riebe, Wenda Overland, MD    Family History History reviewed. No pertinent family history.  Social History Social History    Substance Use Topics  . Smoking status: Never Smoker  . Smokeless tobacco: Never Used  . Alcohol use No     Allergies   Patient has no known allergies.   Review of Systems Review of Systems All other systems reviewed and are negative except that which was mentioned in HPI   Physical Exam Updated Vital Signs BP 119/60 (BP Location: Left Arm)   Pulse 69   Temp 98.6 F (37 C) (Oral)   Resp 20   Ht 5\' 5"  (1.651 m)   Wt 68 kg (150 lb)   SpO2 99%   BMI 24.96 kg/m   Physical Exam  Constitutional: She is oriented to person, place, and time. She appears well-developed and well-nourished. No distress.  HENT:  Head: Normocephalic and atraumatic.  Moist mucous membranes  Eyes: Pupils are equal, round, and reactive to light. Conjunctivae are normal.  Neck: Neck supple.  Cardiovascular: Normal rate, regular rhythm, normal heart sounds and intact distal pulses.   No murmur heard. Pulmonary/Chest: Effort normal and breath sounds normal.  Abdominal: Soft. Bowel sounds are normal. She exhibits no distension. There is no tenderness.  Musculoskeletal: She exhibits edema and tenderness.  R arm diffusely edematous compared to L; erythema of medial upper arm extending down medial elbow and onto forearm  Neurological: She is alert and oriented to person, place, and time.  Fluent speech  Skin: Skin is warm and dry. There  is erythema.  Erythema of medial R upper arm to forearm, mildly indurated, no fluctuance or wounds  Psychiatric: She has a normal mood and affect. Judgment normal.  Nursing note and vitals reviewed.    ED Treatments / Results  Labs (all labs ordered are listed, but only abnormal results are displayed) Labs Reviewed  COMPREHENSIVE METABOLIC PANEL - Abnormal; Notable for the following:       Result Value   Potassium 3.4 (*)    All other components within normal limits  CBC WITH DIFFERENTIAL/PLATELET - Abnormal; Notable for the following:    RBC 3.79 (*)    All  other components within normal limits  URINALYSIS, ROUTINE W REFLEX MICROSCOPIC - Abnormal; Notable for the following:    Hgb urine dipstick SMALL (*)    Ketones, ur 5 (*)    Squamous Epithelial / LPF 0-5 (*)    All other components within normal limits  I-STAT CG4 LACTIC ACID, ED - Abnormal; Notable for the following:    Lactic Acid, Venous 0.33 (*)    All other components within normal limits  I-STAT CG4 LACTIC ACID, ED    EKG  EKG Interpretation None       Radiology No results found.  Procedures Procedures (including critical care time)  Medications Ordered in ED Medications  ondansetron (ZOFRAN-ODT) disintegrating tablet 4 mg (4 mg Oral Given 07/18/17 1049)  oxyCODONE-acetaminophen (PERCOCET/ROXICET) 5-325 MG per tablet 1 tablet (1 tablet Oral Given 07/18/17 1156)  doxycycline (VIBRA-TABS) tablet 100 mg (100 mg Oral Given 07/18/17 1439)     Initial Impression / Assessment and Plan / ED Course  I have reviewed the triage vital signs and the nursing notes.  Pertinent labs & imaging results that were available during my care of the patient were reviewed by me and considered in my medical decision making (see chart for details).     PT w/ worsening redness and pain on R arm, no trauma. H/o cellulitis in same arm. Exam c/w cellulitis. No fluctuance or crepitus to suggest deep infection. Obtained screening labs which showed normal lactate, normal CBC and CMP, no evidence of dehydration. She has no risk factors for DVT And I feel that upper extremity DVT would be highly unusual in someone with no recent trauma/procedure or line to arm. Furthermore, the appearance is c/w cellulitis and pt states sx are same as previous episode.  Gave dose of doxycycline in ED. She has no immunosuppression or other comorbidities to make her at increased risk for infection and given reassuring VS and work up, I feel she is appropriate for outpatient management of cellulitis. Gave doxycycline and  instructed on supportive measures. Extensively reviewed return precautions including worsening signs of infection, fever, chest pain, or SOB. PT voiced understanding and was discharged in satisfactory condition.   Final Clinical Impressions(s) / ED Diagnoses   Final diagnoses:  Right arm cellulitis    New Prescriptions Discharge Medication List as of 07/18/2017  3:21 PM    START taking these medications   Details  doxycycline (VIBRAMYCIN) 100 MG capsule Take 1 capsule (100 mg total) by mouth 2 (two) times daily., Starting Wed 07/18/2017, Print         Lakhia Gengler, Wenda Overland, MD 07/18/17 2130

## 2017-08-14 ENCOUNTER — Ambulatory Visit (INDEPENDENT_AMBULATORY_CARE_PROVIDER_SITE_OTHER): Payer: Medicare Other | Admitting: Family Medicine

## 2017-08-14 ENCOUNTER — Encounter: Payer: Self-pay | Admitting: Family Medicine

## 2017-08-14 VITALS — BP 117/70 | HR 80 | Temp 97.7°F | Resp 18 | Ht 65.24 in

## 2017-08-14 DIAGNOSIS — I890 Lymphedema, not elsewhere classified: Secondary | ICD-10-CM

## 2017-08-14 DIAGNOSIS — L659 Nonscarring hair loss, unspecified: Secondary | ICD-10-CM | POA: Diagnosis not present

## 2017-08-14 DIAGNOSIS — Z780 Asymptomatic menopausal state: Secondary | ICD-10-CM

## 2017-08-14 MED ORDER — MOMETASONE FUROATE 0.1 % EX SOLN: Freq: Every day | CUTANEOUS | 0 refills | Status: DC

## 2017-08-14 MED ORDER — BETAMETHASONE DIPROPIONATE 0.05 % EX CREA: TOPICAL_CREAM | Freq: Two times a day (BID) | CUTANEOUS | 0 refills | Status: DC

## 2017-08-14 NOTE — Progress Notes (Signed)
8/21/201812:47 PM  Connie Meyer [birth year], 66 y.o. female [id number]  Chief Complaint  Patient presents with  . 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 female with past medical history significant for bilateral breast cancer in remission who presents today requesting lab work done for hair loss for past 4-6 months. She saw a dermatologist, per patient, did not examen her scalp, and told her she needed blood work. She denies any change in temp tolerance, changes in bowels, mood, nails or skin. She does feel more tired than usual. She did start taking biotin about 3 weeks ago.  She denies any recent sore throat, neck swelling, neck pain.  She denies any hair pulling or increased stressors. However she does report recent hospitalization for cellulitis of RUE 2/2 worsening lymphedema. She does not tend to wear her compression sleeve during the summer given the heat. She is requesting referral to PT.  She denies any fhx of thyroid disorders, female baldness.   She does not have a PCP, has not seen onc in years, s/p bilateral mastectomy with reconstruction. Reports normal colonscopy done in 2017. Has never had DEXA scan.   Depression screen [facility] 2/9 08/14/2017 09/18/2016 08/30/2016  Decreased Interest 0 0 0  Down, Depressed, Hopeless 0 0 0  PHQ - 2 Score 0 0 0    No Known Allergies  Current Outpatient Prescriptions on File Prior to Visit  Medication Sig Dispense Refill  . doxycycline (VIBRAMYCIN) 100 MG capsule Take 1 capsule (100 mg total) by mouth 2 (two) times daily. (Patient not taking: Reported on 08/14/2017) 14 capsule 0  . oxyCODONE-acetaminophen (PERCOCET) 5-325 MG tablet Take 1 tablet by mouth every 4 (four) hours as needed. (Patient not taking: Reported on 08/14/2017) 12 tablet 0   No current facility-administered medications on file prior to visit.     Past Medical History:  Diagnosis Date  . Cancer [facility])     Past  Surgical History:  Procedure Laterality Date  . BREAST SURGERY      Social History  Substance Use Topics  . Smoking status: Never Smoker  . Smokeless tobacco: Never Used  . Alcohol use 0.0 oz/week    History reviewed. No pertinent family history.  Review of Systems  Constitutional: Negative for chills and fever.  Respiratory: Negative for cough and shortness of breath.   Cardiovascular: Negative for chest pain, palpitations and leg swelling.  Gastrointestinal: Negative for abdominal pain, nausea and vomiting.     OBJECTIVE:  Blood pressure 117/70, pulse 80, temperature 97.7 F (36.5 C), temperature source Oral, resp. rate 18, height 5' 5.24" (1.657 m), SpO2 99 %.  Physical Exam  Constitutional: She is oriented to person, place, and time and well-developed, well-nourished, and in no distress.  HENT:  Head: Normocephalic and atraumatic.  Mouth/Throat: Oropharynx is clear and moist. No oropharyngeal exudate.  Eyes: Pupils are equal, round, and reactive to light. EOM are normal. No scleral icterus.  Neck: Neck supple. No thyroid mass and no thyromegaly present.  Cardiovascular: Normal rate, regular rhythm and normal heart sounds.  Exam reveals no gallop and no friction rub.   No murmur heard. Pulmonary/Chest: Effort normal and breath sounds normal. She has no wheezes. She has no rales.  Musculoskeletal: She exhibits edema (increased girth of RUE).  Neurological: She is alert and oriented to person, place, and time. Gait normal.  Skin: Skin is warm and  dry.  Scalp with generalized mild erythema, no scarring or lesions seen.       ASSESSMENT and PLAN:  1. Alopecia Checking labs as below. Discussed DDX. Also treating empirically with topical steroids. Discussed OTC rogaine.  - CBC with Differential - Comprehensive metabolic panel - TSH  2. Lymphedema Referring to PT - Ambulatory referral to Physical Therapy  3. Postmenopausal Due for osteoporosis screening. - DG  Bone Density; Future     Meds ordered this encounter  Medications  . DISCONTD: betamethasone dipropionate (DIPROLENE) 0.05 % cream    Sig: Apply topically 2 (two) times daily.    Dispense:  30 g    Refill:  0  . mometasone (ELOCON) 0.1 % lotion    Sig: Apply topically daily.    Dispense:  60 mL    Refill:  [place], MD Primary Care at [place], [place] [address] Ph.  [phone] Fax [phone]

## 2017-08-15 LAB — CBC WITH DIFFERENTIAL/PLATELET
Basophils Absolute: 0 10*3/uL (ref 0.0–0.2)
Basos: 1 %
EOS (ABSOLUTE): 0.2 10*3/uL (ref 0.0–0.4)
Eos: 6 %
Hematocrit: 36.3 % (ref 34.0–46.6)
Hemoglobin: 11.4 g/dL (ref 11.1–15.9)
Immature Grans (Abs): 0 10*3/uL (ref 0.0–0.1)
Immature Granulocytes: 0 %
Lymphocytes Absolute: 0.8 10*3/uL (ref 0.7–3.1)
Lymphs: 22 %
MCH: 31.6 pg (ref 26.6–33.0)
MCHC: 31.4 g/dL — ABNORMAL LOW (ref 31.5–35.7)
MCV: 101 fL — ABNORMAL HIGH (ref 79–97)
Monocytes Absolute: 0.3 10*3/uL (ref 0.1–0.9)
Monocytes: 8 %
Neutrophils Absolute: 2.4 10*3/uL (ref 1.4–7.0)
Neutrophils: 63 %
Platelets: 200 10*3/uL (ref 150–379)
RBC: 3.61 x10E6/uL — ABNORMAL LOW (ref 3.77–5.28)
RDW: 13.8 % (ref 12.3–15.4)
WBC: 3.8 10*3/uL (ref 3.4–10.8)

## 2017-08-15 LAB — COMPREHENSIVE METABOLIC PANEL
ALT: 10 IU/L (ref 0–32)
AST: 17 IU/L (ref 0–40)
Albumin/Globulin Ratio: 1.7 (ref 1.2–2.2)
Albumin: 4.4 g/dL (ref 3.6–4.8)
Alkaline Phosphatase: 67 IU/L (ref 39–117)
BUN/Creatinine Ratio: 18 (ref 12–28)
BUN: 14 mg/dL (ref 8–27)
Bilirubin Total: 0.4 mg/dL (ref 0.0–1.2)
CO2: 23 mmol/L (ref 20–29)
Calcium: 9.3 mg/dL (ref 8.7–10.3)
Chloride: 106 mmol/L (ref 96–106)
Creatinine, Ser: 0.78 mg/dL (ref 0.57–1.00)
GFR calc Af Amer: 92 mL/min/{1.73_m2} (ref >59)
GFR calc non Af Amer: 79 mL/min/{1.73_m2} (ref >59)
Globulin, Total: 2.6 g/dL (ref 1.5–4.5)
Glucose: 89 mg/dL (ref 65–99)
Potassium: 4.4 mmol/L (ref 3.5–5.2)
Sodium: 143 mmol/L (ref 134–144)
Total Protein: 7 g/dL (ref 6.0–8.5)

## 2017-08-15 LAB — TSH: TSH: 2.77 u[IU]/mL (ref 0.450–4.500)

## 2017-09-03 DIAGNOSIS — M6281 Muscle weakness (generalized): Secondary | ICD-10-CM | POA: Diagnosis not present

## 2017-09-03 DIAGNOSIS — M25611 Stiffness of right shoulder, not elsewhere classified: Secondary | ICD-10-CM | POA: Diagnosis not present

## 2017-09-03 DIAGNOSIS — I972 Postmastectomy lymphedema syndrome: Secondary | ICD-10-CM | POA: Diagnosis not present

## 2017-09-03 DIAGNOSIS — M25511 Pain in right shoulder: Secondary | ICD-10-CM | POA: Diagnosis not present

## 2017-09-10 DIAGNOSIS — M25511 Pain in right shoulder: Secondary | ICD-10-CM | POA: Diagnosis not present

## 2017-09-10 DIAGNOSIS — I972 Postmastectomy lymphedema syndrome: Secondary | ICD-10-CM | POA: Diagnosis not present

## 2017-09-10 DIAGNOSIS — M6281 Muscle weakness (generalized): Secondary | ICD-10-CM | POA: Diagnosis not present

## 2017-09-10 DIAGNOSIS — M25611 Stiffness of right shoulder, not elsewhere classified: Secondary | ICD-10-CM | POA: Diagnosis not present

## 2017-09-13 DIAGNOSIS — M6281 Muscle weakness (generalized): Secondary | ICD-10-CM | POA: Diagnosis not present

## 2017-09-13 DIAGNOSIS — M25511 Pain in right shoulder: Secondary | ICD-10-CM | POA: Diagnosis not present

## 2017-09-13 DIAGNOSIS — I972 Postmastectomy lymphedema syndrome: Secondary | ICD-10-CM | POA: Diagnosis not present

## 2017-09-13 DIAGNOSIS — M25611 Stiffness of right shoulder, not elsewhere classified: Secondary | ICD-10-CM | POA: Diagnosis not present

## 2017-09-17 DIAGNOSIS — M6281 Muscle weakness (generalized): Secondary | ICD-10-CM | POA: Diagnosis not present

## 2017-09-17 DIAGNOSIS — M25611 Stiffness of right shoulder, not elsewhere classified: Secondary | ICD-10-CM | POA: Diagnosis not present

## 2017-09-17 DIAGNOSIS — I972 Postmastectomy lymphedema syndrome: Secondary | ICD-10-CM | POA: Diagnosis not present

## 2017-09-17 DIAGNOSIS — M25511 Pain in right shoulder: Secondary | ICD-10-CM | POA: Diagnosis not present

## 2017-09-20 DIAGNOSIS — I972 Postmastectomy lymphedema syndrome: Secondary | ICD-10-CM | POA: Diagnosis not present

## 2017-09-20 DIAGNOSIS — M25611 Stiffness of right shoulder, not elsewhere classified: Secondary | ICD-10-CM | POA: Diagnosis not present

## 2017-09-20 DIAGNOSIS — M6281 Muscle weakness (generalized): Secondary | ICD-10-CM | POA: Diagnosis not present

## 2017-09-20 DIAGNOSIS — M25511 Pain in right shoulder: Secondary | ICD-10-CM | POA: Diagnosis not present

## 2018-01-29 DIAGNOSIS — H2513 Age-related nuclear cataract, bilateral: Secondary | ICD-10-CM | POA: Diagnosis not present

## 2018-01-29 DIAGNOSIS — H524 Presbyopia: Secondary | ICD-10-CM | POA: Diagnosis not present

## 2018-01-29 DIAGNOSIS — H04123 Dry eye syndrome of bilateral lacrimal glands: Secondary | ICD-10-CM | POA: Diagnosis not present

## 2018-01-29 DIAGNOSIS — H0100A Unspecified blepharitis right eye, upper and lower eyelids: Secondary | ICD-10-CM | POA: Diagnosis not present

## 2018-02-07 DIAGNOSIS — H25812 Combined forms of age-related cataract, left eye: Secondary | ICD-10-CM | POA: Diagnosis not present

## 2018-02-07 DIAGNOSIS — H52222 Regular astigmatism, left eye: Secondary | ICD-10-CM | POA: Diagnosis not present

## 2018-02-07 DIAGNOSIS — H2512 Age-related nuclear cataract, left eye: Secondary | ICD-10-CM | POA: Diagnosis not present

## 2018-02-26 ENCOUNTER — Encounter: Payer: Self-pay | Admitting: Family Medicine

## 2018-02-26 ENCOUNTER — Other Ambulatory Visit: Payer: Self-pay

## 2018-02-26 ENCOUNTER — Ambulatory Visit (INDEPENDENT_AMBULATORY_CARE_PROVIDER_SITE_OTHER): Payer: Medicare Other | Admitting: Family Medicine

## 2018-02-26 VITALS — BP 130/74 | HR 74 | Temp 97.8°F | Ht 66.0 in | Wt 139.0 lb

## 2018-02-26 DIAGNOSIS — G4700 Insomnia, unspecified: Secondary | ICD-10-CM

## 2018-02-26 DIAGNOSIS — R110 Nausea: Secondary | ICD-10-CM

## 2018-02-26 DIAGNOSIS — F4321 Adjustment disorder with depressed mood: Secondary | ICD-10-CM | POA: Diagnosis not present

## 2018-02-26 DIAGNOSIS — R5383 Other fatigue: Secondary | ICD-10-CM | POA: Diagnosis not present

## 2018-02-26 MED ORDER — TRAZODONE HCL 50 MG PO TABS: 50.0000 mg | ORAL_TABLET | Freq: Every evening | ORAL | 0 refills | Status: DC | PRN

## 2018-02-26 MED ORDER — ONDANSETRON 8 MG PO TBDP: 8.0000 mg | ORAL_TABLET | Freq: Three times a day (TID) | ORAL | 0 refills | Status: DC | PRN

## 2018-02-26 NOTE — Patient Instructions (Signed)
     IF you received an x-ray today, you will receive an invoice from [facility]. Please contact [facility] at [phone] with questions or concerns regarding your invoice.   IF you received labwork today, you will receive an invoice from [facility]. Please contact [facility] at [phone] with questions or concerns regarding your invoice.   Our billing staff will not be able to assist you with questions regarding bills from these companies.  You will be contacted with the lab results as soon as they are available. The fastest way to get your results is to activate your My Chart account. Instructions are located on the last page of this paperwork. If you have not heard from us regarding the results in 2 weeks, please contact this office.     

## 2018-02-26 NOTE — Progress Notes (Signed)
3/5/201912:08 PM  Connie Meyer [birth year], 67 y.o. female [id number]  Chief Complaint  Patient presents with  . Nausea    Had the flu one month ago. Has not felt better since then. Denies any flu like symptoms today. Feeling reallly fatigue and depressed. Mom died on 03/02/23    HPI:   Patient is a 67 y.o. female who presents today for about a month of nausea, fatigue and insomnia. She believes these symptoms started before the death of her mother but not sure  She was sick with what she thinks might have been the flu about a month ago. However she has not been feeling well since then, very tired, has not been exercising which is unusual for her, constant nausea with decreased appetite. Vomited once. No abd pain, bloating, diarrhea, constipation, gerd. She also despite being tired has not been able to sleep. She has lost weight, has circles underneath her eyes and people are starting to mention that she does not feel well. She states she is doing ok with her mother death, that there is no grief involved, "nobody expects me to be sad" Her mother lived in [place], she last saw her mother in Dec, her mother was ill. She is the only surviving member of her nuclear family.   EGD 2017, benign esophageal stenosis and reactive gastropathy Reports normal colonoscopy in 2018  Depression screen [facility] 2/9 08/14/2017 09/18/2016 08/30/2016  Decreased Interest 0 0 0  Down, Depressed, Hopeless 0 0 0  PHQ - 2 Score 0 0 0    No Known Allergies  Prior to Admission medications   Medication Sig Start Date End Date Taking? Authorizing Provider    Past Medical History:  Diagnosis Date  . Cancer [facility])    bilateral breast    Past Surgical History:  Procedure Laterality Date  . BREAST SURGERY      Social History   Tobacco Use  . Smoking status: Never Smoker  . Smokeless tobacco: Never Used  Substance Use Topics  . Alcohol use: Yes    Alcohol/week: 0.0 oz    History reviewed. No  pertinent family history.  Review of Systems  Constitutional: Positive for malaise/fatigue and weight loss. Negative for chills and fever.  HENT: Negative for congestion, ear pain and sore throat.   Respiratory: Negative for cough and shortness of breath.   Cardiovascular: Negative for chest pain, palpitations and leg swelling.  Gastrointestinal: Positive for nausea. Negative for abdominal pain, blood in stool, constipation, diarrhea, heartburn, melena and vomiting.  Genitourinary: Negative for dysuria and hematuria.  Musculoskeletal: Negative for falls.  Neurological: Negative for dizziness, sensory change, speech change, focal weakness and headaches.  Endo/Heme/Allergies: Negative for polydipsia.  Psychiatric/Behavioral: The patient has insomnia.      OBJECTIVE:  Blood pressure 130/74, pulse 74, temperature 97.8 F (36.6 C), temperature source Oral, height 5\' 6"  (1.676 m), weight 139 lb (63 kg), SpO2 98 %.  Physical Exam  Constitutional: She is oriented to person, place, and time and well-developed, well-nourished, and in no distress.  HENT:  Head: Normocephalic and atraumatic.  Right Ear: Hearing, tympanic membrane, external ear and ear canal normal.  Left Ear: Hearing, tympanic membrane, external ear and ear canal normal.  Mouth/Throat: Oropharynx is clear and moist.  Eyes: EOM are normal. Pupils are equal, round, and reactive to light.  Neck: Neck supple. No thyromegaly present.  Cardiovascular: Normal rate, regular rhythm, normal heart sounds and intact distal pulses. Exam reveals no gallop and  no friction rub.  No murmur heard. Pulmonary/Chest: Effort normal and breath sounds normal. She has no wheezes. She has no rales.  Abdominal: Soft. Bowel sounds are normal. She exhibits no distension and no mass. There is no tenderness.  Musculoskeletal: Normal range of motion. She exhibits no edema.  Lymphadenopathy:    She has no cervical adenopathy.  Neurological: She is alert and  oriented to person, place, and time. She has normal reflexes. Gait normal.  Skin: Skin is warm and dry.  Psychiatric: Mood and affect normal.  Nursing note and vitals reviewed.   ASSESSMENT and PLAN  1. Nausea without vomiting - CBC with Differential - Comprehensive metabolic panel - Lipase  2. Insomnia, unspecified type  3. Fatigue, unspecified type - TSH  4. Grief Symptoms above most likely related to grief, discussed pushing fluids, ordering basic labs and meds for symptom control. Discussed grief stages, importance of support, consider counseling.   Other orders - ondansetron (ZOFRAN-ODT) 8 MG disintegrating tablet; Take 1 tablet (8 mg total) by mouth every 8 (eight) hours as needed for nausea. - traZODone (DESYREL) 50 MG tablet; Take 1-2 tablets (50-100 mg total) by mouth at bedtime as needed for sleep.  Return in about 2 weeks (around 03/12/2018).    [name], MD Primary Care at [place], [place] [address] Ph.  [phone] Fax [phone]

## 2018-02-27 LAB — COMPREHENSIVE METABOLIC PANEL
ALT: 14 IU/L (ref 0–32)
AST: 15 IU/L (ref 0–40)
Albumin/Globulin Ratio: 1.5 (ref 1.2–2.2)
Albumin: 4.5 g/dL (ref 3.6–4.8)
Alkaline Phosphatase: 80 IU/L (ref 39–117)
BUN/Creatinine Ratio: 15 (ref 12–28)
BUN: 12 mg/dL (ref 8–27)
Bilirubin Total: 0.3 mg/dL (ref 0.0–1.2)
CO2: 22 mmol/L (ref 20–29)
Calcium: 9.3 mg/dL (ref 8.7–10.3)
Chloride: 105 mmol/L (ref 96–106)
Creatinine, Ser: 0.82 mg/dL (ref 0.57–1.00)
GFR calc Af Amer: 86 mL/min/{1.73_m2} (ref >59)
GFR calc non Af Amer: 74 mL/min/{1.73_m2} (ref >59)
Globulin, Total: 3 g/dL (ref 1.5–4.5)
Glucose: 84 mg/dL (ref 65–99)
Potassium: 4 mmol/L (ref 3.5–5.2)
Sodium: 143 mmol/L (ref 134–144)
Total Protein: 7.5 g/dL (ref 6.0–8.5)

## 2018-02-27 LAB — CBC WITH DIFFERENTIAL/PLATELET
Basophils Absolute: 0 10*3/uL (ref 0.0–0.2)
Basos: 0 %
EOS (ABSOLUTE): 0.3 10*3/uL (ref 0.0–0.4)
Eos: 5 %
Hematocrit: 37.9 % (ref 34.0–46.6)
Hemoglobin: 12.6 g/dL (ref 11.1–15.9)
Immature Grans (Abs): 0 10*3/uL (ref 0.0–0.1)
Immature Granulocytes: 0 %
Lymphocytes Absolute: 1.2 10*3/uL (ref 0.7–3.1)
Lymphs: 21 %
MCH: 31.3 pg (ref 26.6–33.0)
MCHC: 33.2 g/dL (ref 31.5–35.7)
MCV: 94 fL (ref 79–97)
Monocytes Absolute: 0.4 10*3/uL (ref 0.1–0.9)
Monocytes: 8 %
Neutrophils Absolute: 3.6 10*3/uL (ref 1.4–7.0)
Neutrophils: 66 %
Platelets: 218 10*3/uL (ref 150–379)
RBC: 4.02 x10E6/uL (ref 3.77–5.28)
RDW: 13 % (ref 12.3–15.4)
WBC: 5.5 10*3/uL (ref 3.4–10.8)

## 2018-02-27 LAB — LIPASE: Lipase: 31 U/L (ref 14–72)

## 2018-02-27 LAB — TSH: TSH: 2.77 u[IU]/mL (ref 0.450–4.500)

## 2018-03-07 DIAGNOSIS — H25811 Combined forms of age-related cataract, right eye: Secondary | ICD-10-CM | POA: Diagnosis not present

## 2018-03-07 DIAGNOSIS — H52221 Regular astigmatism, right eye: Secondary | ICD-10-CM | POA: Diagnosis not present

## 2018-03-07 DIAGNOSIS — H2511 Age-related nuclear cataract, right eye: Secondary | ICD-10-CM | POA: Diagnosis not present

## 2018-03-12 ENCOUNTER — Encounter: Payer: Self-pay | Admitting: Family Medicine

## 2018-03-12 ENCOUNTER — Other Ambulatory Visit: Payer: Self-pay

## 2018-03-12 ENCOUNTER — Ambulatory Visit (INDEPENDENT_AMBULATORY_CARE_PROVIDER_SITE_OTHER): Payer: Medicare Other | Admitting: Family Medicine

## 2018-03-12 VITALS — BP 138/72 | HR 97 | Temp 97.8°F | Ht 66.0 in | Wt 139.8 lb

## 2018-03-12 DIAGNOSIS — G4700 Insomnia, unspecified: Secondary | ICD-10-CM

## 2018-03-12 DIAGNOSIS — F4321 Adjustment disorder with depressed mood: Secondary | ICD-10-CM

## 2018-03-12 MED ORDER — TRAZODONE HCL 50 MG PO TABS: 50.0000 mg | ORAL_TABLET | Freq: Every evening | ORAL | 2 refills | Status: DC | PRN

## 2018-03-12 NOTE — Progress Notes (Signed)
   3/19/201910:30 AM  Connie Meyer 1951-10-06, 67 y.o. female [id number]  Chief Complaint  Patient presents with  . Follow-up    still having some nausea, however the medications seems to help. also to dicuss lab work    HPI:   Patient is a 67 y.o. female with past medical history significant for grief who presents today for followup  Overall doing better, getting out of bed, going outside trazadone working well, has some groggy feeling but tolerable Nausea resolved, has not needed any zofran in over a week, eating back to normal Has been able to speak with her children about grief, good communication, much better support Has no acute concerns today  Depression screen [facility] 2/9 03/12/2018 08/14/2017 09/18/2016  Decreased Interest 0 0 0  Down, Depressed, Hopeless 0 0 0  PHQ - 2 Score 0 0 0    No Known Allergies  Prior to Admission medications   Medication Sig Start Date End Date Taking? Authorizing Provider  ondansetron (ZOFRAN-ODT) 8 MG disintegrating tablet Take 1 tablet (8 mg total) by mouth every 8 (eight) hours as needed for nausea. 02/26/18  Yes [name], MD  traZODone (DESYREL) 50 MG tablet Take 1-2 tablets (50-100 mg total) by mouth at bedtime as needed for sleep. 02/26/18  Yes [name], MD    Past Medical History:  Diagnosis Date  . Cancer [facility])    bilateral breast    Past Surgical History:  Procedure Laterality Date  . BREAST SURGERY      Social History   Tobacco Use  . Smoking status: Never Smoker  . Smokeless tobacco: Never Used  Substance Use Topics  . Alcohol use: Yes    Alcohol/week: 0.0 oz    History reviewed. No pertinent family history.  ROS Per hpi  OBJECTIVE:  Blood pressure 138/72, pulse 97, temperature 97.8 F (36.6 C), temperature source Oral, height 5\' 6"  (1.676 m), weight 139 lb 12.8 oz (63.4 kg), SpO2 99 %.  Wt Readings from Last 3 Encounters:  03/12/18 139 lb 12.8 oz (63.4 kg)  02/26/18 139 lb (63 kg)    07/18/17 150 lb (68 kg)    Physical Exam  Constitutional: She is oriented to person, place, and time and well-developed, well-nourished, and in no distress.  HENT:  Head: Normocephalic and atraumatic.  Mouth/Throat: Mucous membranes are normal.  Eyes: EOM are normal. Pupils are equal, round, and reactive to light. No scleral icterus.  Neck: Neck supple.  Pulmonary/Chest: Effort normal.  Neurological: She is alert and oriented to person, place, and time. Gait normal.  Skin: Skin is warm and dry.  Psychiatric: Mood and affect normal.  Nursing note and vitals reviewed.    ASSESSMENT and PLAN  1. Grief 2. Insomnia, unspecified type Overall doing better, discussed trial of 75mg  of trazadone. Improved support and awareness of grief.  Other orders - traZODone (DESYREL) 50 MG tablet; Take 1-2 tablets (50-100 mg total) by mouth at bedtime as needed for sleep.  Return in about 4 weeks (around 04/09/2018).    [name], MD Primary Care at [place], [place] [address] Ph.  [phone] Fax [phone]

## 2018-03-12 NOTE — Patient Instructions (Addendum)
1. Lets try 75 mg of trazadone (1.5 tablets ) at bedtime and see if you still are able to get good sleep and wake up without any groggy feelings.     IF you received an x-ray today, you will receive an invoice from [facility]. Please contact [facility] at [phone] with questions or concerns regarding your invoice.   IF you received labwork today, you will receive an invoice from [place]. Please contact [facility] at [phone] with questions or concerns regarding your invoice.   Our billing staff will not be able to assist you with questions regarding bills from these companies.  You will be contacted with the lab results as soon as they are available. The fastest way to get your results is to activate your My Chart account. Instructions are located on the last page of this paperwork. If you have not heard from [place] regarding the results in 2 weeks, please contact this office.

## 2018-03-21 ENCOUNTER — Other Ambulatory Visit: Payer: Self-pay

## 2018-03-21 ENCOUNTER — Ambulatory Visit (INDEPENDENT_AMBULATORY_CARE_PROVIDER_SITE_OTHER): Payer: Medicare Other | Admitting: Family Medicine

## 2018-03-21 ENCOUNTER — Encounter: Payer: Self-pay | Admitting: Family Medicine

## 2018-03-21 ENCOUNTER — Telehealth: Payer: Self-pay | Admitting: Family Medicine

## 2018-03-21 VITALS — BP 132/84 | HR 88 | Temp 98.4°F | Resp 20 | Ht 64.96 in | Wt 136.0 lb

## 2018-03-21 DIAGNOSIS — M79601 Pain in right arm: Secondary | ICD-10-CM | POA: Diagnosis not present

## 2018-03-21 DIAGNOSIS — M7989 Other specified soft tissue disorders: Secondary | ICD-10-CM | POA: Diagnosis not present

## 2018-03-21 DIAGNOSIS — L03113 Cellulitis of right upper limb: Secondary | ICD-10-CM | POA: Diagnosis not present

## 2018-03-21 LAB — POCT CBC
Granulocyte percent: 7.8 %G — AB (ref 37–80)
HCT, POC: 40.3 % (ref 37.7–47.9)
Hemoglobin: 13.4 g/dL (ref 12.2–16.2)
Lymph, poc: 10.1 — AB (ref 0.6–3.4)
MCH, POC: 32 pg — AB (ref 27–31.2)
MCHC: 33.3 g/dL (ref 31.8–35.4)
MCV: 96.1 fL (ref 80–97)
MID (cbc): 84.9 — AB (ref 0–0.9)
MPV: 6.8 fL (ref 0–99.8)
POC Granulocyte: 0.5 — AB (ref 2–6.9)
POC LYMPH PERCENT: 5 %L — AB (ref 10–50)
POC MID %: 0.9 %M (ref 0–12)
Platelet Count, POC: 227 10*3/uL (ref 142–424)
RBC: 4.2 M/uL (ref 4.04–5.48)
RDW, POC: 12.9 %
WBC: 9.2 10*3/uL (ref 4.6–10.2)

## 2018-03-21 LAB — LACTIC ACID, PLASMA: Lactate, Ven: 11.3 mg/dL (ref 4.8–25.7)

## 2018-03-21 LAB — BASIC METABOLIC PANEL
BUN/Creatinine Ratio: 18 (ref 12–28)
BUN: 16 mg/dL (ref 8–27)
CO2: 20 mmol/L (ref 20–29)
Calcium: 9.6 mg/dL (ref 8.7–10.3)
Chloride: 102 mmol/L (ref 96–106)
Creatinine, Ser: 0.91 mg/dL (ref 0.57–1.00)
GFR calc Af Amer: 76 mL/min/{1.73_m2} (ref >59)
GFR calc non Af Amer: 65 mL/min/{1.73_m2} (ref >59)
Glucose: 92 mg/dL (ref 65–99)
Potassium: 4.9 mmol/L (ref 3.5–5.2)
Sodium: 140 mmol/L (ref 134–144)

## 2018-03-21 MED ORDER — CEFTRIAXONE SODIUM 1 G IJ SOLR
1.0000 g | Freq: Once | INTRAMUSCULAR | Status: AC
  Administered 2018-03-21: 1 g via INTRAMUSCULAR

## 2018-03-21 MED ORDER — DOXYCYCLINE HYCLATE 100 MG PO TABS: 100.0000 mg | ORAL_TABLET | Freq: Two times a day (BID) | ORAL | 0 refills | Status: DC

## 2018-03-21 NOTE — Progress Notes (Signed)
3/28/20194:55 PM  Connie Meyer 08/17/1951, 67 y.o. female [id number]  Chief Complaint  Patient presents with  . Arm Pain    X 1 hour right arm- pt states swelling and warm     HPI:   Patient is a 67 y.o. female with past medical history significant for h/o breast cancer who presents today for acute redness, swelling and pain of right arm. Started today. Denies any trauma. Reports h/o cellulitis on that arm before but usually hurts up by her shoulder. This time it just got red, hot and swollen. Feels chills, has not noticed fever. Endorses malaise. Unsure if SOB or chest pain - just does not feel well. Denies any vomiting.    Depression screen [facility] 2/9 03/21/2018 03/12/2018 08/14/2017  Decreased Interest 0 0 0  Down, Depressed, Hopeless 0 0 0  PHQ - 2 Score 0 0 0    No Known Allergies  Prior to Admission medications   Medication Sig Start Date End Date Taking? Authorizing Provider  ondansetron (ZOFRAN-ODT) 8 MG disintegrating tablet Take 1 tablet (8 mg total) by mouth every 8 (eight) hours as needed for nausea. 02/26/18  Yes [name], MD  traZODone (DESYREL) 50 MG tablet Take 1-2 tablets (50-100 mg total) by mouth at bedtime as needed for sleep. 03/12/18  Yes [name], MD    Past Medical History:  Diagnosis Date  . Cancer [facility])    bilateral breast    Past Surgical History:  Procedure Laterality Date  . BREAST SURGERY      Social History   Tobacco Use  . Smoking status: Never Smoker  . Smokeless tobacco: Never Used  Substance Use Topics  . Alcohol use: Yes    Alcohol/week: 0.0 oz    History reviewed. No pertinent family history.  ROS Per hpi  OBJECTIVE:  Blood pressure 132/84, pulse 88, temperature 98.4 F (36.9 C), resp. rate 20, height 5' 4.96" (1.65 m), weight 136 lb (61.7 kg), SpO2 100 %.  Physical Exam  Constitutional: She is oriented to person, place, and time and well-developed, well-nourished, and in no distress. She has a sickly  appearance.  HENT:  Head: Normocephalic and atraumatic.  Mouth/Throat: Oropharynx is clear and moist. No oropharyngeal exudate.  Eyes: Pupils are equal, round, and reactive to light. EOM are normal. No scleral icterus.  Neck: Neck supple.  Cardiovascular: Normal rate, regular rhythm and normal heart sounds. Exam reveals no gallop and no friction rub.  No murmur heard. Pulmonary/Chest: Effort normal and breath sounds normal. She has no wheezes. She has no rales.  Musculoskeletal:  Right upper extremity from axilla to about a 1/3 above the elbow with circumferential swelling, erythema, warmth and tenderness.   Neurological: She is alert and oriented to person, place, and time. Gait normal.      Results for orders placed or performed in visit on 03/21/18 (from the past 24 hour(s))  POCT CBC     Status: Abnormal   Collection Time: 03/21/18  5:19 PM  Result Value Ref Range   WBC 9.2 4.6 - 10.2 K/uL   Lymph, poc 10.1 (A) 0.6 - 3.4   POC LYMPH PERCENT 5.0 (A) 10 - 50 %L   MID (cbc) 84.9 (A) 0 - 0.9   POC MID % 0.9 0 - 12 %M   POC Granulocyte 0.5 (A) 2 - 6.9   Granulocyte percent 7.8 (A) 37 - 80 %G   RBC 4.20 4.04 - 5.48 M/uL   Hemoglobin 13.4  12.2 - 16.2 g/dL   HCT, POC 40.3 37.7 - 47.9 %   MCV 96.1 80 - 97 fL   MCH, POC 32.0 (A) 27 - 31.2 pg   MCHC 33.3 31.8 - 35.4 g/dL   RDW, POC 12.9 %   Platelet Count, POC 227 142 - 424 K/uL   MPV 6.8 0 - 99.8 fL    ASSESSMENT and PLAN  1. Pain and swelling of right upper extremity Normal WBC, area marked with surgical pen. Treating for cellulitis. Ordering duplex to evaluate for DVT. Patient notified of appt for tomorrow morning with follow up with me later in the day. ER precautions given. - POCT CBC - Basic Metabolic Panel - cefTRIAXone (ROCEPHIN) injection 1 g - Lactic Acid, Plasma - VAS [place] UPPER EXTREMITY VENOUS DUPLEX; Future  Other orders - doxycycline (VIBRA-TABS) 100 MG tablet; Take 1 tablet (100 mg total) by mouth 2 (two) times  daily.  Return in about 1 day (around 03/22/2018) for f/u cellulitis.    [name], MD [facility] at [place],  [address] Ph.  [phone] Fax [phone]

## 2018-03-21 NOTE — Telephone Encounter (Signed)
Phone call to patient. Left detailed message with information regarding vascular study tomorrow. It is scheduled tomorrow at 1pm at [facility]. Patient will also need appointment with Dr. Pamella Pert to follow up as soon as possible. Please schedule patient for soonest possible office visit after vascular study.

## 2018-03-21 NOTE — Patient Instructions (Signed)
     IF you received an x-ray today, you will receive an invoice from [facility]. Please contact [facility] at [phone] with questions or concerns regarding your invoice.   IF you received labwork today, you will receive an invoice from [facility]. Please contact [facility] at [phone] with questions or concerns regarding your invoice.   Our billing staff will not be able to assist you with questions regarding bills from these companies.  You will be contacted with the lab results as soon as they are available. The fastest way to get your results is to activate your My Chart account. Instructions are located on the last page of this paperwork. If you have not heard from us regarding the results in 2 weeks, please contact this office.     

## 2018-03-22 ENCOUNTER — Ambulatory Visit (HOSPITAL_COMMUNITY): Admission: RE | Admit: 2018-03-22 | Payer: Medicare Other | Source: Ambulatory Visit

## 2018-03-22 ENCOUNTER — Encounter: Payer: Self-pay | Admitting: Family Medicine

## 2018-03-22 ENCOUNTER — Ambulatory Visit: Payer: Medicare Other | Admitting: Family Medicine

## 2018-04-02 ENCOUNTER — Other Ambulatory Visit: Payer: Self-pay | Admitting: Family Medicine

## 2018-04-02 NOTE — Telephone Encounter (Signed)
Spoke   With the  Pharmacist  Rob  At  [facility] and  [facility]  Rx with refills  On file  Not sure why  Request  Was  [employer]

## 2018-04-09 ENCOUNTER — Ambulatory Visit: Payer: Medicare Other | Admitting: Family Medicine

## 2018-06-24 ENCOUNTER — Other Ambulatory Visit: Payer: Self-pay | Admitting: Family Medicine

## 2018-06-24 NOTE — Telephone Encounter (Signed)
Refill of desyrel  LOV 03/21/18  Dr. Pamella Pert  [facility] 03/12/18  #60  2 refills  [place]

## 2018-06-29 NOTE — Telephone Encounter (Signed)
Refill req for trazodone sent to Dr. Pamella Pert

## 2018-07-01 NOTE — Telephone Encounter (Signed)
Prescription refilled, but she needs follow-up. Please call and schedule for within the next month. thanks

## 2018-07-26 ENCOUNTER — Other Ambulatory Visit: Payer: Self-pay | Admitting: Family Medicine

## 2018-07-26 MED ORDER — TRAZODONE HCL 50 MG PO TABS: ORAL_TABLET | ORAL | 0 refills | Status: DC

## 2018-07-26 NOTE — Telephone Encounter (Signed)
rx refilled for 30 days  Please schedule appointment, last seen in March 2019. Thanks

## 2018-07-26 NOTE — Telephone Encounter (Signed)
trazodone refill Last Refill:07/01/18 # 60 Last OV: 03/12/18 PCP: Dr Pamella Pert Pharmacy:[facility].

## 2018-07-26 NOTE — Telephone Encounter (Signed)
Please advise refill for Trazodone 50mg .

## 2018-09-02 ENCOUNTER — Other Ambulatory Visit: Payer: Self-pay | Admitting: Family Medicine

## 2018-09-02 NOTE — Telephone Encounter (Signed)
doxycyline 100 mg  refill Last Refill:0328/19 # 20 Last OV: 03/21/18 PCP: I. [place]: [facility] [phone]

## 2018-11-05 ENCOUNTER — Ambulatory Visit (INDEPENDENT_AMBULATORY_CARE_PROVIDER_SITE_OTHER): Payer: Medicare Other | Admitting: Family Medicine

## 2018-11-05 ENCOUNTER — Ambulatory Visit (INDEPENDENT_AMBULATORY_CARE_PROVIDER_SITE_OTHER): Payer: Medicare Other

## 2018-11-05 ENCOUNTER — Other Ambulatory Visit: Payer: Self-pay

## 2018-11-05 ENCOUNTER — Encounter: Payer: Self-pay | Admitting: Family Medicine

## 2018-11-05 VITALS — BP 140/78 | HR 80 | Temp 97.7°F | Ht 65.0 in | Wt 132.0 lb

## 2018-11-05 DIAGNOSIS — M545 Low back pain, unspecified: Secondary | ICD-10-CM

## 2018-11-05 DIAGNOSIS — S20229A Contusion of unspecified back wall of thorax, initial encounter: Secondary | ICD-10-CM

## 2018-11-05 MED ORDER — MELOXICAM 7.5 MG PO TABS: 7.5000 mg | ORAL_TABLET | Freq: Every day | ORAL | 0 refills | Status: DC

## 2018-11-05 NOTE — Patient Instructions (Addendum)
X-rays did not show any fractures or broken bones.  I suspect you have a contusion of the low back as well as a possible strain with some spasm.  Try meloxicam 1 to 2/day, continue heat or ice with gentle range of motion stretches, Biofreeze is okay as well.  I expect the pain to be improving into next week, but if not can consider orthopedic evaluation or physical therapy.  Let me know.  Thank you for coming in today.   Contusion A contusion is a deep bruise. Contusions are the result of a blunt injury to tissues and muscle fibers under the skin. The injury causes bleeding under the skin. The skin overlying the contusion may turn blue, purple, or yellow. Minor injuries will give you a painless contusion, but more severe contusions may stay painful and swollen for a few weeks. What are the causes? This condition is usually caused by a blow, trauma, or direct force to an area of the body. What are the signs or symptoms? Symptoms of this condition include:  Swelling of the injured area.  Pain and tenderness in the injured area.  Discoloration. The area may have redness and then turn blue, purple, or yellow.  How is this diagnosed? This condition is diagnosed based on a physical exam and medical history. An X-ray, CT scan, or MRI may be needed to determine if there are any associated injuries, such as broken bones (fractures). How is this treated? Specific treatment for this condition depends on what area of the body was injured. In general, the best treatment for a contusion is resting, icing, applying pressure to (compression), and elevating the injured area. This is often called the RICE strategy. Over-the-counter anti-inflammatory medicines may also be recommended for pain control. Follow these instructions at home:  Rest the injured area.  If directed, apply ice to the injured area: ? Put ice in a plastic bag. ? Place a towel between your skin and the bag. ? Leave the ice on for 20  minutes, 2-3 times per day.  If directed, apply light compression to the injured area using an elastic bandage. Make sure the bandage is not wrapped too tightly. Remove and reapply the bandage as directed by your health care provider.  If possible, raise (elevate) the injured area above the level of your heart while you are sitting or lying down.  Take over-the-counter and prescription medicines only as told by your health care provider. Contact a health care provider if:  Your symptoms do not improve after several days of treatment.  Your symptoms get worse.  You have difficulty moving the injured area. Get help right away if:  You have severe pain.  You have numbness in a hand or foot.  Your hand or foot turns pale or cold. This information is not intended to replace advice given to you by your health care provider. Make sure you discuss any questions you have with your health care provider. Document Released: 09/20/2005 Document Revised: 04/20/2016 Document Reviewed: 04/28/2015 Elsevier Interactive Patient Education  2018 [employer].  Back Pain, Adult Many adults have back pain from time to time. Common causes of back pain include:  A strained muscle or ligament.  Wear and tear (degeneration) of the spinal disks.  Arthritis.  A hit to the back.  Back pain can be short-lived (acute) or last a long time (chronic). A physical exam, lab tests, and imaging studies may be done to find the cause of your pain. Follow these  instructions at home: Managing pain and stiffness  Take over-the-counter and prescription medicines only as told by your health care provider.  If directed, apply heat to the affected area as often as told by your health care provider. Use the heat source that your health care provider recommends, such as a moist heat pack or a heating pad. ? Place a towel between your skin and the heat source. ? Leave the heat on for 20-30 minutes. ? Remove the heat if  your skin turns bright red. This is especially important if you are unable to feel pain, heat, or cold. You have a greater risk of getting burned.  If directed, apply ice to the injured area: ? Put ice in a plastic bag. ? Place a towel between your skin and the bag. ? Leave the ice on for 20 minutes, 2-3 times a day for the first 2-3 days. Activity  Do not stay in bed. Resting more than 1-2 days can delay your recovery.  Take short walks on even surfaces as soon as you are able. Try to increase the length of time you walk each day.  Do not sit, drive, or stand in one place for more than 30 minutes at a time. Sitting or standing for long periods of time can put stress on your back.  Use proper lifting techniques. When you bend and lift, use positions that put less stress on your back: ? [place] your knees. ? Keep the load close to your body. ? Avoid twisting.  Exercise regularly as told by your health care provider. Exercising will help your back heal faster. This also helps prevent back injuries by keeping muscles strong and flexible.  Your health care provider may recommend that you see a physical therapist. This person can help you come up with a safe exercise program. Do any exercises as told by your physical therapist. Lifestyle  Maintain a healthy weight. Extra weight puts stress on your back and makes it difficult to have good posture.  Avoid activities or situations that make you feel anxious or stressed. Learn ways to manage anxiety and stress. One way to manage stress is through exercise. Stress and anxiety increase muscle tension and can make back pain worse. General instructions  Sleep on a firm mattress in a comfortable position. Try lying on your side with your knees slightly bent. If you lie on your back, put a pillow under your knees.  Follow your treatment plan as told by your health care provider. This may include: ? Cognitive or behavioral therapy. ? Acupuncture or  massage therapy. ? Meditation or yoga. Contact a health care provider if:  You have pain that is not relieved with rest or medicine.  You have increasing pain going down into your legs or buttocks.  Your pain does not improve in 2 weeks.  You have pain at night.  You lose weight.  You have a fever or chills. Get help right away if:  You develop new bowel or bladder control problems.  You have unusual weakness or numbness in your arms or legs.  You develop nausea or vomiting.  You develop abdominal pain.  You feel faint. Summary  Many adults have back pain from time to time. A physical exam, lab tests, and imaging studies may be done to find the cause of your pain.  Use proper lifting techniques. When you bend and lift, use positions that put less stress on your back.  Take over-the-counter and prescription medicines and apply  heat or ice as directed by your health care provider. This information is not intended to replace advice given to you by your health care provider. Make sure you discuss any questions you have with your health care provider. Document Released: 12/11/2005 Document Revised: 01/15/2017 Document Reviewed: 01/15/2017 Elsevier Interactive Patient Education  [employer].   If you have lab work done today you will be contacted with your lab results within the next 2 weeks.  If you have not heard from [place] then please contact us. The fastest way to get your results is to register for My Chart.   IF you received an x-ray today, you will receive an invoice from [facility]. Please contact [facility] at [phone] with questions or concerns regarding your invoice.   IF you received labwork today, you will receive an invoice from [place]. Please contact [facility] at [phone] with questions or concerns regarding your invoice.   Our billing staff will not be able to assist you with questions regarding bills from these companies.  You  will be contacted with the lab results as soon as they are available. The fastest way to get your results is to activate your My Chart account. Instructions are located on the last page of this paperwork. If you have not heard from [place] regarding the results in 2 weeks, please contact this office.

## 2018-11-05 NOTE — Progress Notes (Signed)
Subjective:    Patient ID: Connie Meyer, female    DOB: Mar 29, 1951, 67 y.o.   MRN: [id number]  HPI Connie Meyer is a 67 y.o. female Presents today for: Chief Complaint  Patient presents with  . fell down stairs    1 wk  . Back Pain   Was getting over the flu.  Felt a little weak, barefoot. Wood stairs at home - slipped and fell down 4 stairs.  Still having some pain in low back.  Noticed bruising next day. No bowel or bladder incontinence, no saddle anesthesia, no lower extremity weakness.  No prior back surgery or chronic back pain.  Still sore, not worse, but not better yet. Limiting ability to exercise. No recent bone density, no known hx of osteopenia/osteoporosis.   Tx; ice, heat, biofreeze, alleve, aspirin, alleve - unsure if these help.    Patient Active Problem List   Diagnosis Date Noted  . Rib pain on left side 09/18/2016  . Hiatal hernia 09/05/2016  . History of breast cancer in female-2000, 2003 03/17/2016   Past Medical History:  Diagnosis Date  . Cancer [facility])    bilateral breast   Past Surgical History:  Procedure Laterality Date  . BREAST SURGERY     No Known Allergies Prior to Admission medications   Medication Sig Start Date End Date Taking? Authorizing Provider  traZODone (DESYREL) 50 MG tablet TAKE 1 TO 2 TABLETS(50 TO 100 MG) BY MOUTH AT BEDTIME AS NEEDED FOR SLEEP 07/26/18  Yes [name], MD   Social History   Socioeconomic History  . Marital status: Divorced    Spouse name: Not on file  . Number of children: 4  . Years of education: Not on file  . Highest education level: Not on file  Occupational History  . Not on file  Social Needs  . Financial resource strain: Not on file  . Food insecurity:    Worry: Not on file    Inability: Not on file  . Transportation needs:    Medical: Not on file    Non-medical: Not on file  Tobacco Use  . Smoking status: Never Smoker  . Smokeless tobacco: Never Used  Substance and  Sexual Activity  . Alcohol use: Yes    Alcohol/week: 0.0 standard drinks  . Drug use: No  . Sexual activity: Never  Lifestyle  . Physical activity:    Days per week: Not on file    Minutes per session: Not on file  . Stress: Not on file  Relationships  . Social connections:    Talks on phone: Not on file    Gets together: Not on file    Attends religious service: Not on file    Active member of club or organization: Not on file    Attends meetings of clubs or organizations: Not on file    Relationship status: Not on file  . Intimate partner violence:    Fear of current or ex partner: Not on file    Emotionally abused: Not on file    Physically abused: Not on file    Forced sexual activity: Not on file  Other Topics Concern  . Not on file  Social History Narrative  . Not on file    Review of Systems Per HPI.     Objective:   Physical Exam  Constitutional: She is oriented to person, place, and time. She appears well-developed and well-nourished. No distress.  HENT:  Head: Normocephalic and  atraumatic.  Cardiovascular: Normal rate.  Pulmonary/Chest: Effort normal.  Musculoskeletal:       Right hip: Normal. She exhibits normal range of motion and no tenderness.       Left hip: Normal. She exhibits normal range of motion and no tenderness.       Lumbar back: She exhibits decreased range of motion (Flexion 70 to 80 degrees, slight decreased right rotation with discomfort left paraspinal), tenderness, bony tenderness and pain. She exhibits no deformity and no laceration.       Back:  Neurological: She is alert and oriented to person, place, and time.  Psychiatric: She has a normal mood and affect.   Vitals:   11/05/18 0933 11/05/18 0936  BP: (!) 163/76 140/78  Pulse: 80   Temp: 97.7 F (36.5 C)   TempSrc: Oral   SpO2: 96%   Weight: 132 lb (59.9 kg)   Height: 5\' 5"  (1.651 m)    Dg Lumbar Spine Complete  Result Date: 11/05/2018 CLINICAL DATA:  [address] down stairs 1  week ago, low back pain EXAM: LUMBAR SPINE - COMPLETE 4+ VIEW COMPARISON:  CT abdomen and pelvis of 05/19/2016 FINDINGS: The lumbar vertebrae are in normal alignment. Intervertebral disc spaces are relatively normal. No acute compression deformity is seen. Mild anterior osteophyte formation is noted. On oblique views, the facet joints are unremarkable. The SI joints appear well corticated. IMPRESSION: Normal alignment with normal disc spaces.  No acute abnormality. Electronically Signed   By: Ivar Drape M.D.   On: 11/05/2018 10:27      Assessment & Plan:    Connie Meyer is a 67 y.o. female Acute low back pain without sciatica, unspecified back pain laterality - Plan: meloxicam (MOBIC) 7.5 MG tablet  Contusion of back, unspecified laterality, initial encounter - Plan: DG Lumbar Spine Complete, meloxicam (MOBIC) 7.5 MG tablet   Suspected contusion of lumbar spine with likely strain/secondary spasm.  No concerning findings on x-ray or history.   -Change to meloxicam 7.5 to 15mg  daily with over-the-counter Tylenol only in addition.  Heat or ice, gentle range of motion discussed.  She states she has been mostly sitting in bed which may be contributing to  spasm.  Recheck if not improving in the next week, and consider Ortho/PT eval.  RTC precautions if worse   Meds ordered this encounter  Medications  . meloxicam (MOBIC) 7.5 MG tablet    Sig: Take 1-2 tablets (7.5-15 mg total) by mouth daily.    Dispense:  30 tablet    Refill:  0   Patient Instructions     X-rays did not show any fractures or broken bones.  I suspect you have a contusion of the low back as well as a possible strain with some spasm.  Try meloxicam 1 to 2/day, continue heat or ice with gentle range of motion stretches, Biofreeze is okay as well.  I expect the pain to be improving into next week, but if not can consider orthopedic evaluation or physical therapy.  Let me know.  Thank you for coming in  today.   Contusion A contusion is a deep bruise. Contusions are the result of a blunt injury to tissues and muscle fibers under the skin. The injury causes bleeding under the skin. The skin overlying the contusion may turn blue, purple, or yellow. Minor injuries will give you a painless contusion, but more severe contusions may stay painful and swollen for a few weeks. What are the causes? This condition is  usually caused by a blow, trauma, or direct force to an area of the body. What are the signs or symptoms? Symptoms of this condition include:  Swelling of the injured area.  Pain and tenderness in the injured area.  Discoloration. The area may have redness and then turn blue, purple, or yellow.  How is this diagnosed? This condition is diagnosed based on a physical exam and medical history. An X-ray, CT scan, or MRI may be needed to determine if there are any associated injuries, such as broken bones (fractures). How is this treated? Specific treatment for this condition depends on what area of the body was injured. In general, the best treatment for a contusion is resting, icing, applying pressure to (compression), and elevating the injured area. This is often called the RICE strategy. Over-the-counter anti-inflammatory medicines may also be recommended for pain control. Follow these instructions at home:  Rest the injured area.  If directed, apply ice to the injured area: ? Put ice in a plastic bag. ? Place a towel between your skin and the bag. ? Leave the ice on for 20 minutes, 2-3 times per day.  If directed, apply light compression to the injured area using an elastic bandage. Make sure the bandage is not wrapped too tightly. Remove and reapply the bandage as directed by your health care provider.  If possible, raise (elevate) the injured area above the level of your heart while you are sitting or lying down.  Take over-the-counter and prescription medicines only as told by  your health care provider. Contact a health care provider if:  Your symptoms do not improve after several days of treatment.  Your symptoms get worse.  You have difficulty moving the injured area. Get help right away if:  You have severe pain.  You have numbness in a hand or foot.  Your hand or foot turns pale or cold. This information is not intended to replace advice given to you by your health care provider. Make sure you discuss any questions you have with your health care provider. Document Released: 09/20/2005 Document Revised: 04/20/2016 Document Reviewed: 04/28/2015 Elsevier Interactive Patient Education  2018 [employer].  Back Pain, Adult Many adults have back pain from time to time. Common causes of back pain include:  A strained muscle or ligament.  Wear and tear (degeneration) of the spinal disks.  Arthritis.  A hit to the back.  Back pain can be short-lived (acute) or last a long time (chronic). A physical exam, lab tests, and imaging studies may be done to find the cause of your pain. Follow these instructions at home: Managing pain and stiffness  Take over-the-counter and prescription medicines only as told by your health care provider.  If directed, apply heat to the affected area as often as told by your health care provider. Use the heat source that your health care provider recommends, such as a moist heat pack or a heating pad. ? Place a towel between your skin and the heat source. ? Leave the heat on for 20-30 minutes. ? Remove the heat if your skin turns bright red. This is especially important if you are unable to feel pain, heat, or cold. You have a greater risk of getting burned.  If directed, apply ice to the injured area: ? Put ice in a plastic bag. ? Place a towel between your skin and the bag. ? Leave the ice on for 20 minutes, 2-3 times a day for the first 2-3 days. Activity  Do not stay in bed. Resting more than 1-2 days can delay your  recovery.  Take short walks on even surfaces as soon as you are able. Try to increase the length of time you walk each day.  Do not sit, drive, or stand in one place for more than 30 minutes at a time. Sitting or standing for long periods of time can put stress on your back.  Use proper lifting techniques. When you bend and lift, use positions that put less stress on your back: ? [place] your knees. ? Keep the load close to your body. ? Avoid twisting.  Exercise regularly as told by your health care provider. Exercising will help your back heal faster. This also helps prevent back injuries by keeping muscles strong and flexible.  Your health care provider may recommend that you see a physical therapist. This person can help you come up with a safe exercise program. Do any exercises as told by your physical therapist. Lifestyle  Maintain a healthy weight. Extra weight puts stress on your back and makes it difficult to have good posture.  Avoid activities or situations that make you feel anxious or stressed. Learn ways to manage anxiety and stress. One way to manage stress is through exercise. Stress and anxiety increase muscle tension and can make back pain worse. General instructions  Sleep on a firm mattress in a comfortable position. Try lying on your side with your knees slightly bent. If you lie on your back, put a pillow under your knees.  Follow your treatment plan as told by your health care provider. This may include: ? Cognitive or behavioral therapy. ? Acupuncture or massage therapy. ? Meditation or yoga. Contact a health care provider if:  You have pain that is not relieved with rest or medicine.  You have increasing pain going down into your legs or buttocks.  Your pain does not improve in 2 weeks.  You have pain at night.  You lose weight.  You have a fever or chills. Get help right away if:  You develop new bowel or bladder control problems.  You have unusual  weakness or numbness in your arms or legs.  You develop nausea or vomiting.  You develop abdominal pain.  You feel faint. Summary  Many adults have back pain from time to time. A physical exam, lab tests, and imaging studies may be done to find the cause of your pain.  Use proper lifting techniques. When you bend and lift, use positions that put less stress on your back.  Take over-the-counter and prescription medicines and apply heat or ice as directed by your health care provider. This information is not intended to replace advice given to you by your health care provider. Make sure you discuss any questions you have with your health care provider. Document Released: 12/11/2005 Document Revised: 01/15/2017 Document Reviewed: 01/15/2017 Elsevier Interactive Patient Education  [employer].   If you have lab work done today you will be contacted with your lab results within the next 2 weeks.  If you have not heard from [place] then please contact us. The fastest way to get your results is to register for My Chart.   IF you received an x-ray today, you will receive an invoice from [facility]. Please contact [facility] at [phone] with questions or concerns regarding your invoice.   IF you received labwork today, you will receive an invoice from [facility]. Please contact [facility] at [phone] with questions or concerns  regarding your invoice.   Our billing staff will not be able to assist you with questions regarding bills from these companies.  You will be contacted with the lab results as soon as they are available. The fastest way to get your results is to activate your My Chart account. Instructions are located on the last page of this paperwork. If you have not heard from [place] regarding the results in 2 weeks, please contact this office.      Signed,   Merri Ray, MD [facility] [place].  11/05/18 10:58 AM

## 2018-11-14 ENCOUNTER — Other Ambulatory Visit: Payer: Self-pay | Admitting: Family Medicine

## 2019-01-07 ENCOUNTER — Telehealth: Payer: Self-pay | Admitting: Family Medicine

## 2019-01-07 NOTE — Telephone Encounter (Signed)
Patient would like to schedule a AWV

## 2019-01-15 DIAGNOSIS — M791 Myalgia, unspecified site: Secondary | ICD-10-CM | POA: Diagnosis not present

## 2019-01-15 DIAGNOSIS — M50323 Other cervical disc degeneration at C6-C7 level: Secondary | ICD-10-CM | POA: Diagnosis not present

## 2019-01-15 DIAGNOSIS — M542 Cervicalgia: Secondary | ICD-10-CM | POA: Diagnosis not present

## 2019-01-15 DIAGNOSIS — M50223 Other cervical disc displacement at C6-C7 level: Secondary | ICD-10-CM | POA: Diagnosis not present

## 2019-01-15 DIAGNOSIS — R51 Headache: Secondary | ICD-10-CM | POA: Diagnosis not present

## 2019-01-22 DIAGNOSIS — M791 Myalgia, unspecified site: Secondary | ICD-10-CM | POA: Diagnosis not present

## 2019-01-22 DIAGNOSIS — R293 Abnormal posture: Secondary | ICD-10-CM | POA: Diagnosis not present

## 2019-01-22 DIAGNOSIS — M50322 Other cervical disc degeneration at C5-C6 level: Secondary | ICD-10-CM | POA: Diagnosis not present

## 2019-01-22 DIAGNOSIS — M9901 Segmental and somatic dysfunction of cervical region: Secondary | ICD-10-CM | POA: Diagnosis not present

## 2019-01-22 DIAGNOSIS — M542 Cervicalgia: Secondary | ICD-10-CM | POA: Diagnosis not present

## 2019-01-22 DIAGNOSIS — M6240 Contracture of muscle, unspecified site: Secondary | ICD-10-CM | POA: Diagnosis not present

## 2019-01-22 DIAGNOSIS — R51 Headache: Secondary | ICD-10-CM | POA: Diagnosis not present

## 2019-01-22 DIAGNOSIS — M50223 Other cervical disc displacement at C6-C7 level: Secondary | ICD-10-CM | POA: Diagnosis not present

## 2019-01-22 DIAGNOSIS — M50323 Other cervical disc degeneration at C6-C7 level: Secondary | ICD-10-CM | POA: Diagnosis not present

## 2019-01-29 DIAGNOSIS — M9901 Segmental and somatic dysfunction of cervical region: Secondary | ICD-10-CM | POA: Diagnosis not present

## 2019-01-29 DIAGNOSIS — M791 Myalgia, unspecified site: Secondary | ICD-10-CM | POA: Diagnosis not present

## 2019-01-29 DIAGNOSIS — M50322 Other cervical disc degeneration at C5-C6 level: Secondary | ICD-10-CM | POA: Diagnosis not present

## 2019-01-29 DIAGNOSIS — M6240 Contracture of muscle, unspecified site: Secondary | ICD-10-CM | POA: Diagnosis not present

## 2019-01-29 DIAGNOSIS — M542 Cervicalgia: Secondary | ICD-10-CM | POA: Diagnosis not present

## 2019-01-29 DIAGNOSIS — R293 Abnormal posture: Secondary | ICD-10-CM | POA: Diagnosis not present

## 2019-02-05 DIAGNOSIS — M542 Cervicalgia: Secondary | ICD-10-CM | POA: Diagnosis not present

## 2019-02-05 DIAGNOSIS — M50322 Other cervical disc degeneration at C5-C6 level: Secondary | ICD-10-CM | POA: Diagnosis not present

## 2019-02-05 DIAGNOSIS — R293 Abnormal posture: Secondary | ICD-10-CM | POA: Diagnosis not present

## 2019-02-05 DIAGNOSIS — M9901 Segmental and somatic dysfunction of cervical region: Secondary | ICD-10-CM | POA: Diagnosis not present

## 2019-02-05 DIAGNOSIS — M791 Myalgia, unspecified site: Secondary | ICD-10-CM | POA: Diagnosis not present

## 2019-02-05 DIAGNOSIS — M6240 Contracture of muscle, unspecified site: Secondary | ICD-10-CM | POA: Diagnosis not present

## 2019-02-12 DIAGNOSIS — M50322 Other cervical disc degeneration at C5-C6 level: Secondary | ICD-10-CM | POA: Diagnosis not present

## 2019-02-12 DIAGNOSIS — R293 Abnormal posture: Secondary | ICD-10-CM | POA: Diagnosis not present

## 2019-02-12 DIAGNOSIS — M542 Cervicalgia: Secondary | ICD-10-CM | POA: Diagnosis not present

## 2019-02-12 DIAGNOSIS — M9901 Segmental and somatic dysfunction of cervical region: Secondary | ICD-10-CM | POA: Diagnosis not present

## 2019-02-12 DIAGNOSIS — M6240 Contracture of muscle, unspecified site: Secondary | ICD-10-CM | POA: Diagnosis not present

## 2019-02-12 DIAGNOSIS — M791 Myalgia, unspecified site: Secondary | ICD-10-CM | POA: Diagnosis not present

## 2019-02-17 ENCOUNTER — Ambulatory Visit (INDEPENDENT_AMBULATORY_CARE_PROVIDER_SITE_OTHER): Payer: Medicare Other | Admitting: Family Medicine

## 2019-02-17 ENCOUNTER — Other Ambulatory Visit: Payer: Self-pay

## 2019-02-17 ENCOUNTER — Encounter: Payer: Self-pay | Admitting: Family Medicine

## 2019-02-17 VITALS — BP 164/90 | Resp 20 | Ht 65.75 in | Wt 132.0 lb

## 2019-02-17 VITALS — BP 163/84 | Resp 20 | Ht 65.75 in | Wt 132.0 lb

## 2019-02-17 DIAGNOSIS — Z130 Encounter for screening for diseases of the blood and blood-forming organs and certain disorders involving the immune mechanism: Secondary | ICD-10-CM | POA: Diagnosis not present

## 2019-02-17 DIAGNOSIS — Z0000 Encounter for general adult medical examination without abnormal findings: Secondary | ICD-10-CM

## 2019-02-17 DIAGNOSIS — Z9013 Acquired absence of bilateral breasts and nipples: Secondary | ICD-10-CM | POA: Diagnosis not present

## 2019-02-17 DIAGNOSIS — Z13228 Encounter for screening for other metabolic disorders: Secondary | ICD-10-CM | POA: Diagnosis not present

## 2019-02-17 DIAGNOSIS — Z1329 Encounter for screening for other suspected endocrine disorder: Secondary | ICD-10-CM

## 2019-02-17 DIAGNOSIS — F4321 Adjustment disorder with depressed mood: Secondary | ICD-10-CM | POA: Diagnosis not present

## 2019-02-17 DIAGNOSIS — Z23 Encounter for immunization: Secondary | ICD-10-CM | POA: Diagnosis not present

## 2019-02-17 DIAGNOSIS — Z1321 Encounter for screening for nutritional disorder: Secondary | ICD-10-CM | POA: Diagnosis not present

## 2019-02-17 DIAGNOSIS — Z13220 Encounter for screening for lipoid disorders: Secondary | ICD-10-CM | POA: Diagnosis not present

## 2019-02-17 DIAGNOSIS — Z780 Asymptomatic menopausal state: Secondary | ICD-10-CM | POA: Diagnosis not present

## 2019-02-17 MED ORDER — HYDROXYZINE HCL 25 MG PO TABS: 12.5000 mg | ORAL_TABLET | Freq: Every evening | ORAL | 2 refills | Status: DC | PRN

## 2019-02-17 NOTE — Progress Notes (Addendum)
 2/24/20202:17 PM  Connie Meyer 03/13/1951, 67 y.o. female [id number]  Chief Complaint  Patient presents with  . Depression    HPI:   Patient is a 67 y.o. female with past medical history significant for breast cancer, insomnia who presents today for depression/grief  She had her AWV earlier this morning Cervical Cancer Screening: n/a Breast Cancer Screening:  S/p bilateral mastectomy, 20 years ago Colorectal Cancer Screening: Dr Hung at [place], 2018, no polyps Bone Density Testing: order today Seasonal Influenza Vaccination: has had this season Td/Tdap Vaccination: not covered by insurance Pneumococcal Vaccination: 13 today, 23 in a year Zoster Vaccination: at pharmacy of choice Frequency of Dental evaluation: Q6 months Frequency of Eye evaluation: yearly   Visual Acuity Screening   Right eye Left eye Both eyes  Without correction: 20/25 20/30 20/25  With correction:       She was doing well depression wise until these past several months, anniversary of her mother, her best friend suddenly died and her dog of 16 years Has good support Denies SI Does not want medication other than to help her sleep Long standing issues with insomnia Tries to go to bed, lies in bed, watches TV as she does not like for it to be quite, her mind never stops Was able to sleep on trazodone, but was causing hangover effect  Retired Recently joined gym Does not smoke Mother had osteoporosis  Fall Risk  02/17/2019 02/17/2019 02/17/2019 11/05/2018 03/21/2018  Falls in the past year? 1 1 1 1 No  Number falls in past yr: 1 1 1 0 -  Injury with Fall? 1 1 1 1 -  Risk Factor Category  - - - - -  Risk for fall due to : - History of fall(s) History of fall(s) - -  Follow up Falls evaluation completed Falls evaluation completed Falls evaluation completed - -     Depression screen PHQ 2/9 11/05/2018 03/21/2018 03/12/2018  Decreased Interest 0 0 0  Down, Depressed, Hopeless 0 0 0  PHQ - 2  Score 0 0 0    No Known Allergies  Prior to Admission medications   Medication Sig Start Date End Date Taking? Authorizing Provider  meloxicam (MOBIC) 7.5 MG tablet Take 1-2 tablets (7.5-15 mg total) by mouth daily. Patient not taking: Reported on 02/17/2019 11/05/18   Greene, Jeffrey R, MD  traZODone (DESYREL) 50 MG tablet TAKE 1- 2 TABLETS BY MOUTH EVERY NIGHT AT BEDTIME AS NEEDED FOR SLEEP Patient not taking: Reported on 02/17/2019 11/14/18   Santiago, Irma M, MD    Past Medical History:  Diagnosis Date  . Cancer (HCC)    bilateral breast    Past Surgical History:  Procedure Laterality Date  . BREAST SURGERY      Social History   Tobacco Use  . Smoking status: Never Smoker  . Smokeless tobacco: Never Used  Substance Use Topics  . Alcohol use: Yes    Alcohol/week: 0.0 standard drinks    History reviewed. No pertinent family history.  Review of Systems  Constitutional: Negative for chills and fever.  Respiratory: Negative for cough and shortness of breath.   Cardiovascular: Negative for chest pain, palpitations and leg swelling.  Gastrointestinal: Negative for abdominal pain, nausea and vomiting.  Psychiatric/Behavioral: Positive for depression. Negative for suicidal ideas. The patient has insomnia.   All other systems reviewed and are negative.    OBJECTIVE:  Blood pressure (!) 164/90. There is no height or weight on file to calculate   BMI.      Physical Exam Vitals signs and nursing note reviewed.  Constitutional:      Appearance: She is well-developed.  HENT:     Head: Normocephalic and atraumatic.     Right Ear: Hearing, tympanic membrane, ear canal and external ear normal.     Left Ear: Hearing, tympanic membrane, ear canal and external ear normal.  Eyes:     Conjunctiva/sclera: Conjunctivae normal.     Pupils: Pupils are equal, round, and reactive to light.  Neck:     Musculoskeletal: Neck supple.     Thyroid: No thyromegaly.  Cardiovascular:      Rate and Rhythm: Normal rate and regular rhythm.     Heart sounds: Normal heart sounds. No murmur. No friction rub. No gallop.   Pulmonary:     Effort: Pulmonary effort is normal.     Breath sounds: Normal breath sounds. No wheezing or rales.  Abdominal:     General: Bowel sounds are normal. There is no distension.     Palpations: Abdomen is soft. There is no mass.     Tenderness: There is no abdominal tenderness.  Musculoskeletal: Normal range of motion.  Lymphadenopathy:     Cervical: No cervical adenopathy.  Skin:    General: Skin is warm and dry.  Neurological:     Mental Status: She is alert and oriented to person, place, and time.     Cranial Nerves: No cranial nerve deficit.     Gait: Gait normal.     Deep Tendon Reflexes: Reflexes are normal and symmetric.     ASSESSMENT and PLAN  1. Grief Reports good support system. Declines referral for counseling. Will do trial of vistril for sleep. Elevated BP, stressed, not sleeping. Recheck nurse visit.  - Care order/instruction:  2. Need for pneumococcal vaccination - Pneumococcal conjugate vaccine 13-valent IM  3. Screening for endocrine, nutritional, metabolic and immunity disorder - CMP14+EGFR  4. Screening for lipid disorders - Lipid panel  5. H/O bilateral mastectomy  6. Postmenopausal estrogen deficiency - DG Bone Density; Future   Other orders - hydrOXYzine (ATARAX/VISTARIL) 25 MG tablet; Take 0.5-1 tablets (12.5-25 mg total) by mouth at bedtime as needed.   Return in about 1 year (around 02/18/2020), or if symptoms worsen or fail to improve.    Irma M Santiago, MD [facility] [address] [place], [place] [address] Ph.  [phone] Fax [phone]   

## 2019-02-17 NOTE — Patient Instructions (Addendum)
Please ask your pharmacist about getting shingrix Please come back in 1 week for nurse BO check. If still elevated then I would like to see you to further discuss.    Bone Health Bones protect organs, store calcium, anchor muscles, and support the whole body. Keeping your bones strong is important, especially as you get older. You can take actions to help keep your bones strong and healthy. Why is keeping my bones healthy important?  Keeping your bones healthy is important because your body constantly replaces bone cells. Cells get old, and new cells take their place. As we age, we lose bone cells because the body may not be able to make enough new cells to replace the old cells. The amount of bone cells and bone tissue you have is referred to as bone mass. The higher your bone mass, the stronger your bones. The aging process leads to an overall loss of bone mass in the body, which can increase the likelihood of:  Joint pain and stiffness.  Broken bones.  A condition in which the bones become weak and brittle (osteoporosis). A large decline in bone mass occurs in older adults. In women, it occurs about the time of menopause. What actions can I take to keep my bones healthy? Good health habits are important for maintaining healthy bones. This includes eating nutritious foods and exercising regularly. To have healthy bones, you need to get enough of the right minerals and vitamins. Most nutrition experts recommend getting these nutrients from the foods that you eat. In some cases, taking supplements may also be recommended. Doing certain types of exercise is also important for bone health. What are the nutritional recommendations for healthy bones?  Eating a well-balanced diet with plenty of calcium and vitamin D will help to protect your bones. Nutritional recommendations vary from person to person. Ask your health care provider what is healthy for you. Here are some general guidelines. Get  enough calcium Calcium is the most important (essential) mineral for bone health. Most people can get enough calcium from their diet, but supplements may be recommended for people who are at risk for osteoporosis. Good sources of calcium include:  Dairy products, such as low-fat or nonfat milk, cheese, and yogurt.  Dark green leafy vegetables, such as bok choy and broccoli.  Calcium-fortified foods, such as orange juice, cereal, bread, soy beverages, and tofu products.  Nuts, such as almonds. Follow these recommended amounts for daily calcium intake:  Children, age 55-3: 700 mg.  Children, age 9-8: 1,000 mg.  Children, age 61-13: 1,300 mg.  Teens, age 38-18: 1,300 mg.  Adults, age 558-50: 1,000 mg.  Adults, age 9-70: ? Men: 1,000 mg. ? Women: 1,200 mg.  Adults, age 22 or older: 1,200 mg.  Pregnant and breastfeeding females: ? Teens: 1,300 mg. ? Adults: 1,000 mg. Get enough vitamin D Vitamin D is the most essential vitamin for bone health. It helps the body absorb calcium. Sunlight stimulates the skin to make vitamin D, so be sure to get enough sunlight. If you live in a cold climate or you do not get outside often, your health care provider may recommend that you take vitamin D supplements. Good sources of vitamin D in your diet include:  Egg yolks.  Saltwater fish.  Milk and cereal fortified with vitamin D. Follow these recommended amounts for daily vitamin D intake:  Children and teens, age 55-18: 600 international units.  Adults, age 72 or younger: 400-800 international units.  Adults, age 51  or older: 800-1,000 international units. Get other important nutrients Other nutrients that are important for bone health include:  Phosphorus. This mineral is found in meat, poultry, dairy foods, nuts, and legumes. The recommended daily intake for adult men and adult women is 700 mg.  Magnesium. This mineral is found in seeds, nuts, dark green vegetables, and legumes. The  recommended daily intake for adult men is 400-420 mg. For adult women, it is 310-320 mg.  Vitamin K. This vitamin is found in green leafy vegetables. The recommended daily intake is 120 mg for adult men and 90 mg for adult women. What type of physical activity is best for building and maintaining healthy bones? Weight-bearing and strength-building activities are important for building and maintaining healthy bones. Weight-bearing activities cause muscles and bones to work against gravity. Strength-building activities increase the strength of the muscles that support bones. Weight-bearing and muscle-building activities include:  Walking and hiking.  Jogging and running.  Dancing.  Gym exercises.  Lifting weights.  Tennis and racquetball.  Climbing stairs.  Aerobics. Adults should get at least 30 minutes of moderate physical activity on most days. Children should get at least 60 minutes of moderate physical activity on most days. Ask your health care provider what type of exercise is best for you. How can I find out if my bone mass is low? Bone mass can be measured with an X-ray test called a bone mineral density (BMD) test. This test is recommended for all women who are age 53 or older. It may also be recommended for:  Men who are age 49 or older.  People who are at risk for osteoporosis because of: ? Having bones that break easily. ? Having a long-term disease that weakens bones, such as kidney disease or rheumatoid arthritis. ? Having menopause earlier than normal. ? Taking medicine that weakens bones, such as steroids, thyroid hormones, or hormone treatment for breast cancer or prostate cancer. ? Smoking. ? Drinking three or more alcoholic drinks a day. If you find that you have a low bone mass, you may be able to prevent osteoporosis or further bone loss by changing your diet and lifestyle. Where can I find more information? For more information, check out the following  websites:  [place]: AviationTales.fr  [employer] of Health: www.bones.SouthExposed.es  International Osteoporosis Foundation: Administrator.iofbonehealth.org Summary  The aging process leads to an overall loss of bone mass in the body, which can increase the likelihood of broken bones and osteoporosis.  Eating a well-balanced diet with plenty of calcium and vitamin D will help to protect your bones.  Weight-bearing and strength-building activities are also important for building and maintaining strong bones.  Bone mass can be measured with an X-ray test called a bone mineral density (BMD) test. This information is not intended to replace advice given to you by your health care provider. Make sure you discuss any questions you have with your health care provider. Document Released: 03/02/2004 Document Revised: 01/07/2018 Document Reviewed: 01/07/2018 Elsevier Interactive Patient Education  2019 [place] 65 Years and Older, Female Preventive care refers to lifestyle choices and visits with your health care provider that can promote health and wellness. What does preventive care include?  A yearly physical exam. This is also called an annual well check.  Dental exams once or twice a year.  Routine eye exams. Ask your health care provider how often you should have your eyes checked.  Personal lifestyle choices, including: ?  Daily care of your teeth and gums. ? Regular physical activity. ? Eating a healthy diet. ? Avoiding tobacco and drug use. ? Limiting alcohol use. ? Practicing safe sex. ? Taking low-dose aspirin every day. ? Taking vitamin and mineral supplements as recommended by your health care provider. What happens during an annual well check? The services and screenings done by your health care provider during your annual well check will depend on your age, overall health, lifestyle risk factors, and family history of  disease. Counseling Your health care provider may ask you questions about your:  Alcohol use.  Tobacco use.  Drug use.  Emotional well-being.  Home and relationship well-being.  Sexual activity.  Eating habits.  History of falls.  Memory and ability to understand (cognition).  Work and work Statistician.  Reproductive health.  Screening You may have the following tests or measurements:  Height, weight, and BMI.  Blood pressure.  Lipid and cholesterol levels. These may be checked every 5 years, or more frequently if you are over 48 years old.  Skin check.  Lung cancer screening. You may have this screening every year starting at age 61 if you have a 30-pack-year history of smoking and currently smoke or have quit within the past 15 years.  Colorectal cancer screening. All adults should have this screening starting at age 54 and continuing until age 36. You will have tests every 1-10 years, depending on your results and the type of screening test. People at increased risk should start screening at an earlier age. Screening tests may include: ? Guaiac-based fecal occult blood testing. ? Fecal immunochemical test (FIT). ? Stool DNA test. ? Virtual colonoscopy. ? Sigmoidoscopy. During this test, a flexible tube with a tiny camera (sigmoidoscope) is used to examine your rectum and lower colon. The sigmoidoscope is inserted through your anus into your rectum and lower colon. ? Colonoscopy. During this test, a long, thin, flexible tube with a tiny camera (colonoscope) is used to examine your entire colon and rectum.  Hepatitis C blood test.  Hepatitis B blood test.  Sexually transmitted disease (STD) testing.  Diabetes screening. This is done by checking your blood sugar (glucose) after you have not eaten for a while (fasting). You may have this done every 1-3 years.  Bone density scan. This is done to screen for osteoporosis. You may have this done starting at age  8.  Mammogram. This may be done every 1-2 years. Talk to your health care provider about how often you should have regular mammograms. Talk with your health care provider about your test results, treatment options, and if necessary, the need for more tests. Vaccines Your health care provider may recommend certain vaccines, such as:  Influenza vaccine. This is recommended every year.  Tetanus, diphtheria, and acellular pertussis (Tdap, Td) vaccine. You may need a Td booster every 10 years.  Varicella vaccine. You may need this if you have not been vaccinated.  Zoster vaccine. You may need this after age 65.  Measles, mumps, and rubella (MMR) vaccine. You may need at least one dose of MMR if you were born in 1957 or later. You may also need a second dose.  Pneumococcal 13-valent conjugate (PCV13) vaccine. One dose is recommended after age 70.  Pneumococcal polysaccharide (PPSV23) vaccine. One dose is recommended after age 43.  Meningococcal vaccine. You may need this if you have certain conditions.  Hepatitis A vaccine. You may need this if you have certain conditions or if you travel or  work in places where you may be exposed to hepatitis A.  Hepatitis B vaccine. You may need this if you have certain conditions or if you travel or work in places where you may be exposed to hepatitis B.  Haemophilus influenzae type b (Hib) vaccine. You may need this if you have certain conditions. Talk to your health care provider about which screenings and vaccines you need and how often you need them. This information is not intended to replace advice given to you by your health care provider. Make sure you discuss any questions you have with your health care provider. Document Released: 01/07/2016 Document Revised: 01/31/2018 Document Reviewed: 10/12/2015 Elsevier Interactive Patient Education  2019 [employer].

## 2019-02-17 NOTE — Progress Notes (Addendum)
Presents today for [employer] Visit   Date of last exam: Initial  Interpreter used for this visit? No  Patient Care Team: [name], MD as PCP - General (Family Medicine)   Other items to address today: Patient states she has been talking to Dr. Pamella Pert regarding her stressors.  Will review Advanced Directives information and discuss with provider.    Other Screening: Last screening for diabetes: 03/21/2018 Last lipid screening:   ADVANCE DIRECTIVES: Discussed: Yes On File: No Materials Provided: Yes  Immunization status:  There is no immunization history for the selected administration types on file for this patient.   Health Maintenance Due  Topic Date Due  . Hepatitis C Screening  Apr 08, 1951  . COLONOSCOPY  10/14/2000  . DEXA SCAN  10/15/2015  . PNA vac Low Risk Adult (1 of 2 - PCV13) 10/15/2015     Functional Status Survey: Is the patient deaf or have difficulty hearing?: No Does the patient have difficulty seeing, even when wearing glasses/contacts?: No Does the patient have difficulty concentrating, remembering, or making decisions?: Yes(States she doesn't feel this is r/t recent stressors but feels there have been changes in short term memory. ) Does the patient have difficulty walking or climbing stairs?: No Does the patient have difficulty dressing or bathing?: No Does the patient have difficulty doing errands alone such as visiting a doctor's office or shopping?: No   No flowsheet data found.      Clinical Support from 02/17/2019 in Primary Care at [facility]  AUDIT-C Score  2      Patient Active Problem List   Diagnosis Date Noted  . Rib pain on left side 09/18/2016  . Hiatal hernia 09/05/2016  . History of breast cancer in female-2000, 2003 03/17/2016     Past Medical History:  Diagnosis Date  . Cancer [facility])    bilateral breast     Past Surgical History:  Procedure Laterality Date  . BREAST SURGERY        History reviewed. No pertinent family history.   Social History   Socioeconomic History  . Marital status: Divorced    Spouse name: Not on file  . Number of children: 4  . Years of education: Not on file  . Highest education level: Not on file  Occupational History  . Not on file  Social Needs  . Financial resource strain: Not on file  . Food insecurity:    Worry: Not on file    Inability: Not on file  . Transportation needs:    Medical: Not on file    Non-medical: Not on file  Tobacco Use  . Smoking status: Never Smoker  . Smokeless tobacco: Never Used  Substance and Sexual Activity  . Alcohol use: Yes    Alcohol/week: 0.0 standard drinks  . Drug use: No  . Sexual activity: Never  Lifestyle  . Physical activity:    Days per week: Not on file    Minutes per session: Not on file  . Stress: Not on file  Relationships  . Social connections:    Talks on phone: Not on file    Gets together: Not on file    Attends religious service: Not on file    Active member of club or organization: Not on file    Attends meetings of clubs or organizations: Not on file    Relationship status: Not on file  . Intimate partner violence:    Fear of current or ex  partner: Not on file    Emotionally abused: Not on file    Physically abused: Not on file    Forced sexual activity: Not on file  Other Topics Concern  . Not on file  Social History Narrative  . Not on file   Patient lives at home.  Has physical activity at gym 3-4 days per week.    No Known Allergies   Prior to Admission medications   Medication Sig Start Date End Date Taking? Authorizing Provider  meloxicam (MOBIC) 7.5 MG tablet Take 1-2 tablets (7.5-15 mg total) by mouth daily. Patient not taking: Reported on 02/17/2019 11/05/18   Wendie Agreste, MD  traZODone (DESYREL) 50 MG tablet TAKE 1- 2 TABLETS BY MOUTH EVERY NIGHT AT BEDTIME AS NEEDED FOR SLEEP Patient not taking: Reported on 02/17/2019 11/14/18    [name], MD     Depression screen [facility] 2/9 11/05/2018 03/21/2018 03/12/2018 08/14/2017 09/18/2016  Decreased Interest 0 0 0 0 0  Down, Depressed, Hopeless 0 0 0 0 0  PHQ - 2 Score 0 0 0 0 0     Fall Risk  02/17/2019 02/17/2019 11/05/2018 03/21/2018 03/12/2018  Falls in the past year? 1 1 1  No No  Number falls in past yr: 1 1 0 - -  Injury with Fall? 1 1 1  - -  Risk Factor Category  - - - - -  Risk for fall due to : History of fall(s) History of fall(s) - - -  Follow up Falls evaluation completed Falls evaluation completed - - -      PHYSICAL EXAM: BP (!) 163/84 (BP Location: Right Arm, Patient Position: Sitting, Cuff Size: Normal)   Resp 20   Ht 5' 5.75" (1.67 m)   Wt 132 lb (59.9 kg)   BMI 21.47 kg/m    Wt Readings from Last 3 Encounters:  02/17/19 132 lb (59.9 kg)  11/05/18 132 lb (59.9 kg)  03/21/18 136 lb (61.7 kg)     No exam data present    Physical Exam   Education/Counseling provided regarding diet and exercise, prevention of chronic diseases, smoking/tobacco cessation, if applicable, and reviewed "Covered Medicare Preventive Services."   ASSESSMENT/PLAN: 1. Medicare annual wellness visit, initial     I have reviewed and agree with the above AWV documentation. Irma. Reubin Milan, MD

## 2019-02-18 LAB — CMP14+EGFR
ALT: 37 IU/L — ABNORMAL HIGH (ref 0–32)
AST: 31 IU/L (ref 0–40)
Albumin/Globulin Ratio: 1.9 (ref 1.2–2.2)
Albumin: 4.8 g/dL (ref 3.8–4.8)
Alkaline Phosphatase: 69 IU/L (ref 39–117)
BUN/Creatinine Ratio: 19 (ref 12–28)
BUN: 15 mg/dL (ref 8–27)
Bilirubin Total: 0.8 mg/dL (ref 0.0–1.2)
CO2: 23 mmol/L (ref 20–29)
Calcium: 9.5 mg/dL (ref 8.7–10.3)
Chloride: 102 mmol/L (ref 96–106)
Creatinine, Ser: 0.77 mg/dL (ref 0.57–1.00)
GFR calc Af Amer: 92 mL/min/{1.73_m2} (ref >59)
GFR calc non Af Amer: 80 mL/min/{1.73_m2} (ref >59)
Globulin, Total: 2.5 g/dL (ref 1.5–4.5)
Glucose: 84 mg/dL (ref 65–99)
Potassium: 3.7 mmol/L (ref 3.5–5.2)
Sodium: 144 mmol/L (ref 134–144)
Total Protein: 7.3 g/dL (ref 6.0–8.5)

## 2019-02-18 LAB — LIPID PANEL
Chol/HDL Ratio: 2.8 ratio (ref 0.0–4.4)
Cholesterol, Total: 176 mg/dL (ref 100–199)
HDL: 62 mg/dL (ref >39)
LDL Calculated: 100 mg/dL — ABNORMAL HIGH (ref 0–99)
Triglycerides: 69 mg/dL (ref 0–149)
VLDL Cholesterol Cal: 14 mg/dL (ref 5–40)

## 2019-02-19 DIAGNOSIS — M6240 Contracture of muscle, unspecified site: Secondary | ICD-10-CM | POA: Diagnosis not present

## 2019-02-19 DIAGNOSIS — R293 Abnormal posture: Secondary | ICD-10-CM | POA: Diagnosis not present

## 2019-02-19 DIAGNOSIS — M9901 Segmental and somatic dysfunction of cervical region: Secondary | ICD-10-CM | POA: Diagnosis not present

## 2019-02-19 DIAGNOSIS — M50322 Other cervical disc degeneration at C5-C6 level: Secondary | ICD-10-CM | POA: Diagnosis not present

## 2019-02-23 NOTE — Addendum Note (Signed)
Addended by: [name] on: 02/23/2019 06:43 PM   Modules accepted: Level of Service

## 2019-02-26 DIAGNOSIS — M9901 Segmental and somatic dysfunction of cervical region: Secondary | ICD-10-CM | POA: Diagnosis not present

## 2019-02-26 DIAGNOSIS — M50322 Other cervical disc degeneration at C5-C6 level: Secondary | ICD-10-CM | POA: Diagnosis not present

## 2019-02-26 DIAGNOSIS — M50323 Other cervical disc degeneration at C6-C7 level: Secondary | ICD-10-CM | POA: Diagnosis not present

## 2019-02-26 DIAGNOSIS — M50223 Other cervical disc displacement at C6-C7 level: Secondary | ICD-10-CM | POA: Diagnosis not present

## 2019-02-26 DIAGNOSIS — R51 Headache: Secondary | ICD-10-CM | POA: Diagnosis not present

## 2019-02-26 DIAGNOSIS — M6240 Contracture of muscle, unspecified site: Secondary | ICD-10-CM | POA: Diagnosis not present

## 2019-02-26 DIAGNOSIS — M791 Myalgia, unspecified site: Secondary | ICD-10-CM | POA: Diagnosis not present

## 2019-02-26 DIAGNOSIS — R293 Abnormal posture: Secondary | ICD-10-CM | POA: Diagnosis not present

## 2019-02-27 ENCOUNTER — Ambulatory Visit (INDEPENDENT_AMBULATORY_CARE_PROVIDER_SITE_OTHER): Payer: Medicare Other | Admitting: Family Medicine

## 2019-02-27 VITALS — BP 130/77 | HR 82

## 2019-02-27 DIAGNOSIS — Z0130 Encounter for examination of blood pressure without abnormal findings: Secondary | ICD-10-CM

## 2019-02-27 NOTE — Progress Notes (Signed)
Had BP checked

## 2019-03-06 DIAGNOSIS — M9901 Segmental and somatic dysfunction of cervical region: Secondary | ICD-10-CM | POA: Diagnosis not present

## 2019-03-06 DIAGNOSIS — M6240 Contracture of muscle, unspecified site: Secondary | ICD-10-CM | POA: Diagnosis not present

## 2019-03-06 DIAGNOSIS — R293 Abnormal posture: Secondary | ICD-10-CM | POA: Diagnosis not present

## 2019-03-06 DIAGNOSIS — M50322 Other cervical disc degeneration at C5-C6 level: Secondary | ICD-10-CM | POA: Diagnosis not present

## 2019-03-12 DIAGNOSIS — R293 Abnormal posture: Secondary | ICD-10-CM | POA: Diagnosis not present

## 2019-03-12 DIAGNOSIS — M6240 Contracture of muscle, unspecified site: Secondary | ICD-10-CM | POA: Diagnosis not present

## 2019-03-12 DIAGNOSIS — M50322 Other cervical disc degeneration at C5-C6 level: Secondary | ICD-10-CM | POA: Diagnosis not present

## 2019-03-12 DIAGNOSIS — M9901 Segmental and somatic dysfunction of cervical region: Secondary | ICD-10-CM | POA: Diagnosis not present

## 2019-03-19 DIAGNOSIS — M6240 Contracture of muscle, unspecified site: Secondary | ICD-10-CM | POA: Diagnosis not present

## 2019-03-19 DIAGNOSIS — R293 Abnormal posture: Secondary | ICD-10-CM | POA: Diagnosis not present

## 2019-03-19 DIAGNOSIS — M9901 Segmental and somatic dysfunction of cervical region: Secondary | ICD-10-CM | POA: Diagnosis not present

## 2019-03-19 DIAGNOSIS — M50322 Other cervical disc degeneration at C5-C6 level: Secondary | ICD-10-CM | POA: Diagnosis not present

## 2019-03-25 DIAGNOSIS — R293 Abnormal posture: Secondary | ICD-10-CM | POA: Diagnosis not present

## 2019-03-25 DIAGNOSIS — M6240 Contracture of muscle, unspecified site: Secondary | ICD-10-CM | POA: Diagnosis not present

## 2019-03-25 DIAGNOSIS — M9901 Segmental and somatic dysfunction of cervical region: Secondary | ICD-10-CM | POA: Diagnosis not present

## 2019-03-25 DIAGNOSIS — M50322 Other cervical disc degeneration at C5-C6 level: Secondary | ICD-10-CM | POA: Diagnosis not present

## 2019-04-09 DIAGNOSIS — M6240 Contracture of muscle, unspecified site: Secondary | ICD-10-CM | POA: Diagnosis not present

## 2019-04-09 DIAGNOSIS — M9901 Segmental and somatic dysfunction of cervical region: Secondary | ICD-10-CM | POA: Diagnosis not present

## 2019-04-09 DIAGNOSIS — M50322 Other cervical disc degeneration at C5-C6 level: Secondary | ICD-10-CM | POA: Diagnosis not present

## 2019-04-09 DIAGNOSIS — R293 Abnormal posture: Secondary | ICD-10-CM | POA: Diagnosis not present

## 2019-04-09 NOTE — Progress Notes (Signed)
diverticulitis in the sigmoid colon and in the descending colon No specimen collected Repeat 07/09/2019

## 2019-04-16 DIAGNOSIS — R51 Headache: Secondary | ICD-10-CM | POA: Diagnosis not present

## 2019-04-16 DIAGNOSIS — M50322 Other cervical disc degeneration at C5-C6 level: Secondary | ICD-10-CM | POA: Diagnosis not present

## 2019-04-16 DIAGNOSIS — M9901 Segmental and somatic dysfunction of cervical region: Secondary | ICD-10-CM | POA: Diagnosis not present

## 2019-04-16 DIAGNOSIS — M50323 Other cervical disc degeneration at C6-C7 level: Secondary | ICD-10-CM | POA: Diagnosis not present

## 2019-04-16 DIAGNOSIS — M50223 Other cervical disc displacement at C6-C7 level: Secondary | ICD-10-CM | POA: Diagnosis not present

## 2019-04-16 DIAGNOSIS — R293 Abnormal posture: Secondary | ICD-10-CM | POA: Diagnosis not present

## 2019-04-16 DIAGNOSIS — M791 Myalgia, unspecified site: Secondary | ICD-10-CM | POA: Diagnosis not present

## 2019-04-16 DIAGNOSIS — M6240 Contracture of muscle, unspecified site: Secondary | ICD-10-CM | POA: Diagnosis not present

## 2019-04-18 ENCOUNTER — Ambulatory Visit: Payer: Self-pay

## 2019-04-18 ENCOUNTER — Encounter: Payer: Self-pay | Admitting: Registered Nurse

## 2019-04-18 ENCOUNTER — Telehealth (INDEPENDENT_AMBULATORY_CARE_PROVIDER_SITE_OTHER): Payer: Medicare Other | Admitting: Registered Nurse

## 2019-04-18 VITALS — BP 165/79

## 2019-04-18 DIAGNOSIS — Z0130 Encounter for examination of blood pressure without abnormal findings: Secondary | ICD-10-CM | POA: Diagnosis not present

## 2019-04-18 NOTE — Telephone Encounter (Signed)
Pt reports elevated BP at appt last night.  Asymptomatic at that time and currently.  Pt prefers not to come into office but will take BP at [facility] and scheduled for telemed.

## 2019-04-18 NOTE — Progress Notes (Signed)
Patient is being seen for complaints of elevated blood pressure readings.  Patient was told on numerous times her readings have been around 709/62 to 836 systolic.  She denies any nausea, vomiting, headaches, arm pain, numbness or tingling.  However, she states she feels her heart beating very fast at times.  The only medication she is on is Vistaril.  Pharmacy is verified in system.   No recent travel/exposure within last 14 days.

## 2019-04-18 NOTE — Telephone Encounter (Signed)
Phone call from pt. To report elevated BP last night while at Chiropractor appt.; BP 170/100 @ 6:00 PM.  Has not rechecked since then; doesn't have BP monitor at home. Denied headache, dizziness, blurred vision, speech slurring, weakness of extremities, chest pain, or shortness of breath.  Stated Dr. Pamella Pert was watching her BP, and trying to decide on whether to put her on medication.  Reported that at her last BP check in March, she was doing better, but has not rechecked it on her own at home.  Called office; transferred to [facility] for an appt.  Pt. Agreed with plan.     Reason for Disposition . Systolic BP  >= 979 OR Diastolic >= 892  Answer Assessment - Initial Assessment Questions 1. BLOOD PRESSURE: "What is the blood pressure?" "Did you take at least two measurements 5 minutes apart?"     170/100 @ 6:00 PM at Chiropractor  2. ONSET: "When did you take your blood pressure?"     See above 3. HOW: "How did you obtain the blood pressure?" (e.g., visiting nurse, automatic home BP monitor)     BP taken at Chiropractor per [employer] 4. HISTORY: "Do you have a history of high blood pressure?"     Has been monitored for elevated BP; not on any medication at this time.  5. MEDICATIONS: "Are you taking any medications for blood pressure?" "Have you missed any doses recently?"     no 6. OTHER SYMPTOMS: "Do you have any symptoms?" (e.g., headache, chest pain, blurred vision, difficulty breathing, weakness)     Denied headache, blurred vision, dizziness, chest pain, shortness of breath, no weakness of extremities; no slurring of speech 7. PREGNANCY: "Is there any chance you are pregnant?" "When was your last menstrual period?"     N/a  Protocols used: HIGH BLOOD PRESSURE-A-AH

## 2019-04-18 NOTE — Progress Notes (Signed)
Telemedicine Encounter- SOAP NOTE Established Patient  This telephone encounter was conducted with the patient's (or proxy's) verbal consent via audio telecommunications: yes  Patient was instructed to have this encounter in a suitably private space; and to only have persons present to whom they give permission to participate. In addition, patient identity was confirmed by use of name plus two identifiers (DOB and address).  I discussed the limitations, risks, security and privacy concerns of performing an evaluation and management service by telephone and the availability of in person appointments. I also discussed with the patient that there may be a patient responsible charge related to this service. The patient expressed understanding and agreed to proceed.  I spent a total of 5 minutes talking with the patient or their proxy.  CC: BP Check  Subjective   Connie Meyer is a 68 y.o. established patient. Telephone visit today for  Pt reports recent history of high blood pressure readings. 02/17/19: 164/90 02/17/19: 163/84 02/27/19: 130/77 04/17/19: 170/100 (Pt reported) 04/18/19: 165/79 (Pt reported)  Pt states this has not been an issue in the past. Pt denies acute symptoms of HTN.    Patient Active Problem List   Diagnosis Date Noted  . H/O bilateral mastectomy 02/17/2019  . Rib pain on left side 09/18/2016  . Hiatal hernia 09/05/2016  . History of breast cancer in female-2000, 2003 03/17/2016    Past Medical History:  Diagnosis Date  . Cancer [facility])    bilateral breast    Current Outpatient Medications  Medication Sig Dispense Refill  . hydrOXYzine (ATARAX/VISTARIL) 25 MG tablet Take 0.5-1 tablets (12.5-25 mg total) by mouth at bedtime as needed. 30 tablet 2   No current facility-administered medications for this visit.     No Known Allergies  Social History   Socioeconomic History  . Marital status: Divorced    Spouse name: Not on file  . Number of  children: 4  . Years of education: Not on file  . Highest education level: Not on file  Occupational History  . Not on file  Social Needs  . Financial resource strain: Not on file  . Food insecurity:    Worry: Not on file    Inability: Not on file  . Transportation needs:    Medical: Not on file    Non-medical: Not on file  Tobacco Use  . Smoking status: Never Smoker  . Smokeless tobacco: Never Used  Substance and Sexual Activity  . Alcohol use: Yes    Alcohol/week: 0.0 standard drinks  . Drug use: No  . Sexual activity: Never  Lifestyle  . Physical activity:    Days per week: Not on file    Minutes per session: Not on file  . Stress: Not on file  Relationships  . Social connections:    Talks on phone: Not on file    Gets together: Not on file    Attends religious service: Not on file    Active member of club or organization: Not on file    Attends meetings of clubs or organizations: Not on file    Relationship status: Not on file  . Intimate partner violence:    Fear of current or ex partner: Not on file    Emotionally abused: Not on file    Physically abused: Not on file    Forced sexual activity: Not on file  Other Topics Concern  . Not on file  Social History Narrative  . Not on file  Objective   Vitals as reported by the patient: Today's Vitals   04/18/19 1234  BP: (!) 165/79    Diagnoses and all orders for this visit:  Blood pressure check  PLAN:  Discussed common causes of episodic high blood pressure including white coat hypertension, stress, and anxiety.  Discussed ordering a home sphygmomanometer for self-monitoring of blood pressure. Patient has done this.  Encouraged patient to take blood pressure at home when relaxed, both feet on floor, arm at rest at heart height or on table.  Instructed patient to monitor BP daily, and to call clinic with further concerns about high readings.     I discussed the assessment and treatment plan  with the patient. The patient was provided an opportunity to ask questions and all were answered. The patient agreed with the plan and demonstrated an understanding of the instructions.   The patient was advised to call back or seek an in-person evaluation if the symptoms worsen or if the condition fails to improve as anticipated.  I provided 5 minutes of non-face-to-face time during this encounter.  Maximiano Coss, NP  Primary Care at [facility]

## 2019-06-06 ENCOUNTER — Encounter: Payer: Self-pay | Admitting: Family Medicine

## 2019-06-06 ENCOUNTER — Ambulatory Visit (INDEPENDENT_AMBULATORY_CARE_PROVIDER_SITE_OTHER): Payer: Medicare Other | Admitting: Family Medicine

## 2019-06-06 ENCOUNTER — Other Ambulatory Visit: Payer: Self-pay

## 2019-06-06 VITALS — BP 136/81 | HR 84 | Temp 97.1°F | Ht 65.75 in | Wt 136.0 lb

## 2019-06-06 DIAGNOSIS — G8929 Other chronic pain: Secondary | ICD-10-CM | POA: Diagnosis not present

## 2019-06-06 DIAGNOSIS — M25511 Pain in right shoulder: Secondary | ICD-10-CM | POA: Diagnosis not present

## 2019-06-06 DIAGNOSIS — I998 Other disorder of circulatory system: Secondary | ICD-10-CM

## 2019-06-06 MED ORDER — MELOXICAM 15 MG PO TABS: 15.0000 mg | ORAL_TABLET | Freq: Every day | ORAL | 0 refills | Status: DC

## 2019-06-06 MED ORDER — AMLODIPINE BESYLATE 5 MG PO TABS: 5.0000 mg | ORAL_TABLET | Freq: Every day | ORAL | 3 refills | Status: DC

## 2019-06-06 NOTE — Progress Notes (Signed)
6/12/20208:44 AM  Connie Meyer May 08, 1951, 68 y.o., female [id number]  Chief Complaint  Patient presents with  . Shoulder Pain    fell off the star climber at the gym 3 month ago, having pain  . boold pressure    keeps log of bp reading, thinks it has to do with the pain she is having in the left shoulder    HPI:   Patient is a 68 y.o. female who presents today for right shoulder pain and elevated BP pressure  About 3 month ago she fell off a stair climber and hurt her right shoulder, she grabbed onto the bar with her left arm in order to brace her fall Reports constant pain, all thru her shoulder, radiating down her deltoid, moving shoulder in any direction hurts  Denies any weakness, numbness or tingling Right handed If she takes ASA 325mg  x 4, twice a day - shoulder and headaches If she takes excedrin migraines, 2 tabs once Headaches better since she has been going to the chiropractor as she was also having neck pain Reports that BP has been rising She wakes up in he 130s and in the evenings goes into 160s Denies any chest pain, edema, palpitations, nausea Has noticed intermittent blurry vision and once noticed a gray line across her Left eye Endorses intermittent dizziness Denies h/o migraine   Fall Risk  06/06/2019 04/18/2019 02/17/2019 02/17/2019 02/17/2019  Falls in the past year? 1 0 1 1 1   Number falls in past yr: 0 - 1 1 1   Injury with Fall? 1 - 1 1 1   Risk Factor Category  - - - - -  Risk for fall due to : - - - History of fall(s) History of fall(s)  Follow up - - Falls evaluation completed Falls evaluation completed Falls evaluation completed     Depression screen [facility] 2/9 06/06/2019 04/18/2019 11/05/2018  Decreased Interest 0 0 0  Down, Depressed, Hopeless 0 0 0  PHQ - 2 Score 0 0 0    No Known Allergies  Prior to Admission medications   Medication Sig Start Date End Date Taking? Authorizing Provider  hydrOXYzine (ATARAX/VISTARIL) 25 MG tablet Take  0.5-1 tablets (12.5-25 mg total) by mouth at bedtime as needed. Patient not taking: Reported on 06/06/2019 02/17/19   [name], MD    Past Medical History:  Diagnosis Date  . 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 BREAST SURGERY      Social History   Tobacco Use  . Smoking status: Never Smoker  . Smokeless tobacco: Never Used  Substance Use Topics  . Alcohol use: Yes    Alcohol/week: 0.0 standard drinks    No family history on file.  ROS Per hpi  OBJECTIVE:  Today's Vitals   06/06/19 0826  BP: 136/81  Pulse: 84  Temp: (!) 97.1 F (36.2 C)  TempSrc: Oral  SpO2: 96%  Weight: 136 lb (61.7 kg)  Height: 5' 5.75" (1.67 m)   Body mass index is 22.12 kg/m.   Physical Exam Vitals signs and nursing note reviewed.  Constitutional:      Appearance: She is well-developed.  HENT:     Head: Normocephalic and atraumatic.     Mouth/Throat:     Pharynx: No oropharyngeal exudate.  Eyes:     General: No scleral icterus.    Conjunctiva/sclera: Conjunctivae normal.     Pupils: Pupils are equal, round, and  reactive to light.  Neck:     Musculoskeletal: Neck supple.  Cardiovascular:     Rate and Rhythm: Normal rate and regular rhythm.     Heart sounds: Normal heart sounds. No murmur. No friction rub. No gallop.   Pulmonary:     Effort: Pulmonary effort is normal.     Breath sounds: Normal breath sounds. No wheezing or rales.  Musculoskeletal:     Right shoulder: She exhibits decreased range of motion and tenderness (diffuse). She exhibits no swelling, no deformity and no spasm.     Comments: - arch drop + hawkins, empty can Distally NVI   Lymphadenopathy:     Cervical: No cervical adenopathy.  Skin:    General: Skin is warm and dry.  Neurological:     Mental Status: She is alert and oriented to person, place, and time.      ASSESSMENT and PLAN  1. Chronic right shoulder pain Trial of meloxicam,  discussed r/se/b. Discussed not taking with ASA/excedrin. Referring to ortho for further eval and treatment. - Ambulatory referral to Orthopedic Surgery  2. Fluctuating blood pressure persistently elevated BP in the evenings. Will start amlodipine, reviewed r/se/b, cont home monitoring, RTC/ER precautions given.  Other orders - meloxicam (MOBIC) 15 MG tablet; Take 1 tablet (15 mg total) by mouth daily. - amLODipine (NORVASC) 5 MG tablet; Take 1 tablet (5 mg total) by mouth daily.  Return in about 4 weeks (around 07/04/2019).    [name], MD Primary Care at [facility], [place] [address] Ph.  [phone] Fax [phone]

## 2019-06-06 NOTE — Patient Instructions (Signed)
° ° ° °  If you have lab work done today you will be contacted with your lab results within the next 2 weeks.  If you have not heard from us then please contact us. The fastest way to get your results is to register for My Chart. ° ° °IF you received an x-ray today, you will receive an invoice from [facility]. Please contact [facility] at [phone] with questions or concerns regarding your invoice.  ° °IF you received labwork today, you will receive an invoice from [facility]. Please contact [facility] at [phone] with questions or concerns regarding your invoice.  ° °Our billing staff will not be able to assist you with questions regarding bills from these companies. ° °You will be contacted with the lab results as soon as they are available. The fastest way to get your results is to activate your My Chart account. Instructions are located on the last page of this paperwork. If you have not heard from us regarding the results in 2 weeks, please contact this office. °  ° ° ° °

## 2019-06-13 ENCOUNTER — Emergency Department (HOSPITAL_COMMUNITY): Payer: Medicare Other

## 2019-06-13 ENCOUNTER — Encounter (HOSPITAL_COMMUNITY): Payer: Self-pay

## 2019-06-13 ENCOUNTER — Other Ambulatory Visit: Payer: Self-pay

## 2019-06-13 ENCOUNTER — Inpatient Hospital Stay (HOSPITAL_COMMUNITY)
Admission: EM | Admit: 2019-06-13 | Discharge: 2019-06-16 | DRG: 041 | Disposition: A | Discharge status: Home / Self Care | Payer: Medicare Other | Attending: Internal Medicine | Admitting: Internal Medicine

## 2019-06-13 DIAGNOSIS — H538 Other visual disturbances: Secondary | ICD-10-CM | POA: Diagnosis not present

## 2019-06-13 DIAGNOSIS — Z853 Personal history of malignant neoplasm of breast: Secondary | ICD-10-CM

## 2019-06-13 DIAGNOSIS — G453 Amaurosis fugax: Secondary | ICD-10-CM | POA: Diagnosis present

## 2019-06-13 DIAGNOSIS — Z20828 Contact with and (suspected) exposure to other viral communicable diseases: Secondary | ICD-10-CM | POA: Diagnosis present

## 2019-06-13 DIAGNOSIS — R531 Weakness: Secondary | ICD-10-CM | POA: Diagnosis not present

## 2019-06-13 DIAGNOSIS — I639 Cerebral infarction, unspecified: Secondary | ICD-10-CM

## 2019-06-13 DIAGNOSIS — R29810 Facial weakness: Secondary | ICD-10-CM | POA: Diagnosis present

## 2019-06-13 DIAGNOSIS — Z7289 Other problems related to lifestyle: Secondary | ICD-10-CM

## 2019-06-13 DIAGNOSIS — Z03818 Encounter for observation for suspected exposure to other biological agents ruled out: Secondary | ICD-10-CM | POA: Diagnosis not present

## 2019-06-13 DIAGNOSIS — E876 Hypokalemia: Secondary | ICD-10-CM

## 2019-06-13 DIAGNOSIS — E785 Hyperlipidemia, unspecified: Secondary | ICD-10-CM | POA: Diagnosis present

## 2019-06-13 DIAGNOSIS — I6340 Cerebral infarction due to embolism of unspecified cerebral artery: Principal | ICD-10-CM | POA: Diagnosis present

## 2019-06-13 DIAGNOSIS — Z9013 Acquired absence of bilateral breasts and nipples: Secondary | ICD-10-CM

## 2019-06-13 DIAGNOSIS — Z79899 Other long term (current) drug therapy: Secondary | ICD-10-CM

## 2019-06-13 DIAGNOSIS — I10 Essential (primary) hypertension: Secondary | ICD-10-CM

## 2019-06-13 DIAGNOSIS — G459 Transient cerebral ischemic attack, unspecified: Secondary | ICD-10-CM | POA: Diagnosis not present

## 2019-06-13 LAB — BASIC METABOLIC PANEL
Anion gap: 13 (ref 5–15)
BUN: 15 mg/dL (ref 8–23)
CO2: 25 mmol/L (ref 22–32)
Calcium: 9.5 mg/dL (ref 8.9–10.3)
Chloride: 104 mmol/L (ref 98–111)
Creatinine, Ser: 0.85 mg/dL (ref 0.44–1.00)
GFR calc Af Amer: >60 mL/min (ref >60)
GFR calc non Af Amer: >60 mL/min (ref >60)
Glucose, Bld: 90 mg/dL (ref 70–99)
Potassium: 3.1 mmol/L — ABNORMAL LOW (ref 3.5–5.1)
Sodium: 142 mmol/L (ref 135–145)

## 2019-06-13 LAB — CBC
HCT: 41.1 % (ref 36.0–46.0)
Hemoglobin: 13 g/dL (ref 12.0–15.0)
MCH: 31.9 pg (ref 26.0–34.0)
MCHC: 31.6 g/dL (ref 30.0–36.0)
MCV: 100.7 fL — ABNORMAL HIGH (ref 80.0–100.0)
Platelets: 191 10*3/uL (ref 150–400)
RBC: 4.08 MIL/uL (ref 3.87–5.11)
RDW: 12.3 % (ref 11.5–15.5)
WBC: 5.3 10*3/uL (ref 4.0–10.5)
nRBC: 0 % (ref 0.0–0.2)

## 2019-06-13 NOTE — ED Triage Notes (Signed)
[facility] reports pt recently dx w HTN and started amlodipine. Pt had grey sheeting and blurry vision in right eye that has resolved. This happened in the left once over the last week before starting the new med.

## 2019-06-13 NOTE — ED Provider Notes (Signed)
[facility] Provider Note   CSN: [id number] Arrival date & time: 06/13/19  2048    History   Chief Complaint Chief Complaint  Patient presents with   Blurred Vision    HPI Connie Meyer is a 68 y.o. female.     HPI Pt had an episode where she felt like she lost half of her vision like a grey sheet.    It was the outside of her right eye. It lasted for 15 minutes.   She took her blood pressure and it was elevated 178/90.   A couple of weeks ago it was on the left side.She mentioned her symptoms to her doctor who told her that if it happened again she should go to the ED.  She is feeling better now.  She did have a headache at the time but now it is gone.  No vomiting.  No trouble with the speech. Past Medical History:  Diagnosis Date   Cancer [facility])    bilateral breast    Patient Active Problem List   Diagnosis Date Noted   H/O bilateral mastectomy 02/17/2019   Rib pain on left side 09/18/2016   Hiatal hernia 09/05/2016   History of breast cancer in female-2000, 2003 03/17/2016    Past Surgical History:  Procedure Laterality Date   BREAST SURGERY       OB History   No obstetric history on file.      Home Medications    Prior to Admission medications   Medication Sig Start Date End Date Taking? Authorizing Provider  amLODipine (NORVASC) 5 MG tablet Take 1 tablet (5 mg total) by mouth daily. 06/06/19  Yes [name], MD  meloxicam (MOBIC) 15 MG tablet Take 1 tablet (15 mg total) by mouth daily. 06/06/19  Yes [name], MD  hydrOXYzine (ATARAX/VISTARIL) 25 MG tablet Take 0.5-1 tablets (12.5-25 mg total) by mouth at bedtime as needed. Patient not taking: Reported on 06/06/2019 02/17/19   [name], MD    Family History History reviewed. No pertinent family history.  Social History Social History   Tobacco Use   Smoking status: Never Smoker   Smokeless tobacco: Never Used  Substance Use  Topics   Alcohol use: Yes    Alcohol/week: 0.0 standard drinks   Drug use: No     Allergies   Patient has no known allergies.   Review of Systems Review of Systems  All other systems reviewed and are negative.    Physical Exam Updated Vital Signs BP (!) 141/83    Pulse 91    Temp 98.2 F (36.8 C) (Oral)    Resp 20    Ht 1.676 m (5\' 6" )    Wt 61.7 kg    SpO2 100%    BMI 21.95 kg/m   Physical Exam Vitals signs and nursing note reviewed.  Constitutional:      General: She is not in acute distress.    Appearance: She is well-developed.  HENT:     Head: Normocephalic and atraumatic.     Right Ear: External ear normal.     Left Ear: External ear normal.  Eyes:     General: No scleral icterus.       Right eye: No discharge.        Left eye: No discharge.     Conjunctiva/sclera: Conjunctivae normal.  Neck:     Musculoskeletal: Neck supple.     Trachea: No tracheal deviation.  Cardiovascular:  Rate and Rhythm: Normal rate and regular rhythm.  Pulmonary:     Effort: Pulmonary effort is normal. No respiratory distress.     Breath sounds: Normal breath sounds. No stridor. No wheezing or rales.  Abdominal:     General: Bowel sounds are normal. There is no distension.     Palpations: Abdomen is soft.     Tenderness: There is no abdominal tenderness. There is no guarding or rebound.  Musculoskeletal:        General: No tenderness.  Skin:    General: Skin is warm and dry.     Findings: No rash.  Neurological:     Mental Status: She is alert and oriented to person, place, and time.     Cranial Nerves: No cranial nerve deficit (no facial droop, extraocular movements intact, no slurred speech).     Sensory: No sensory deficit.     Motor: No abnormal muscle tone or seizure activity.     Coordination: Coordination normal.     Comments: No pronator drift bilateral upper extrem, able to hold both legs off bed for 5 seconds, sensation intact in all extremities, no visual  field cuts, no left or right sided neglect, normal finger-nose exam bilaterally, no nystagmus noted       ED Treatments / Results  Labs (all labs ordered are listed, but only abnormal results are displayed) Labs Reviewed  CBC - Abnormal; Notable for the following components:      Result Value   MCV 100.7 (*)    All other components within normal limits  BASIC METABOLIC PANEL - Abnormal; Notable for the following components:   Potassium 3.1 (*)    All other components within normal limits    EKG EKG Interpretation  Date/Time:  Friday June 13 2019 22:01:44 EDT Ventricular Rate:  71 PR Interval:    QRS Duration: 88 QT Interval:  400 QTC Calculation: 435 R Axis:   71 Text Interpretation:  Sinus rhythm No significant change since last tracing Confirmed by Dorie Rank [phone]) on 06/13/2019 10:04:15 PM   Radiology No results found.  Procedures Procedures (including critical care time)  Medications Ordered in ED Medications - No data to display   Initial Impression / Assessment and Plan / ED Course  I have reviewed the triage vital signs and the nursing notes.  Pertinent labs & imaging results that were available during my care of the patient were reviewed by me and considered in my medical decision making (see chart for details).       Presented to emergency room with complaints of acute visual disturbance.  Symptoms were suggestive of possible ocular etiology as it was unilateral however last week she had symptoms involving her other eye.  Differential includes TIA, stroke, complex migraine versus ocular etiologies such as retinal detachment, vitreous hemorrhage.  Plan on MRI.  If negative since the patient symptoms have all resolved we will have her follow-up with ophthalmology.  Care turned over to Dr Tyrone Nine pending MRI results.  Final Clinical Impressions(s) / ED Diagnoses  pending   Dorie Rank, MD 06/13/19 2324

## 2019-06-14 ENCOUNTER — Observation Stay (HOSPITAL_COMMUNITY): Payer: Medicare Other

## 2019-06-14 ENCOUNTER — Observation Stay (HOSPITAL_BASED_OUTPATIENT_CLINIC_OR_DEPARTMENT_OTHER): Payer: Medicare Other

## 2019-06-14 DIAGNOSIS — G459 Transient cerebral ischemic attack, unspecified: Secondary | ICD-10-CM | POA: Diagnosis not present

## 2019-06-14 DIAGNOSIS — I639 Cerebral infarction, unspecified: Secondary | ICD-10-CM

## 2019-06-14 DIAGNOSIS — E876 Hypokalemia: Secondary | ICD-10-CM | POA: Diagnosis not present

## 2019-06-14 DIAGNOSIS — G453 Amaurosis fugax: Secondary | ICD-10-CM | POA: Diagnosis present

## 2019-06-14 DIAGNOSIS — I10 Essential (primary) hypertension: Secondary | ICD-10-CM

## 2019-06-14 DIAGNOSIS — Z79899 Other long term (current) drug therapy: Secondary | ICD-10-CM | POA: Diagnosis not present

## 2019-06-14 DIAGNOSIS — Z20828 Contact with and (suspected) exposure to other viral communicable diseases: Secondary | ICD-10-CM | POA: Diagnosis present

## 2019-06-14 DIAGNOSIS — Z9013 Acquired absence of bilateral breasts and nipples: Secondary | ICD-10-CM | POA: Diagnosis not present

## 2019-06-14 DIAGNOSIS — H538 Other visual disturbances: Secondary | ICD-10-CM | POA: Diagnosis not present

## 2019-06-14 DIAGNOSIS — R29810 Facial weakness: Secondary | ICD-10-CM | POA: Diagnosis present

## 2019-06-14 DIAGNOSIS — E785 Hyperlipidemia, unspecified: Secondary | ICD-10-CM | POA: Diagnosis present

## 2019-06-14 DIAGNOSIS — Z853 Personal history of malignant neoplasm of breast: Secondary | ICD-10-CM | POA: Diagnosis not present

## 2019-06-14 DIAGNOSIS — I6389 Other cerebral infarction: Secondary | ICD-10-CM | POA: Diagnosis not present

## 2019-06-14 DIAGNOSIS — Z7289 Other problems related to lifestyle: Secondary | ICD-10-CM | POA: Diagnosis not present

## 2019-06-14 DIAGNOSIS — I6340 Cerebral infarction due to embolism of unspecified cerebral artery: Secondary | ICD-10-CM | POA: Diagnosis present

## 2019-06-14 HISTORY — DX: Hypokalemia: E87.6

## 2019-06-14 LAB — BASIC METABOLIC PANEL
Anion gap: 9 (ref 5–15)
BUN: 14 mg/dL (ref 8–23)
CO2: 24 mmol/L (ref 22–32)
Calcium: 9.2 mg/dL (ref 8.9–10.3)
Chloride: 107 mmol/L (ref 98–111)
Creatinine, Ser: 0.75 mg/dL (ref 0.44–1.00)
GFR calc Af Amer: >60 mL/min (ref >60)
GFR calc non Af Amer: >60 mL/min (ref >60)
Glucose, Bld: 108 mg/dL — ABNORMAL HIGH (ref 70–99)
Potassium: 3.1 mmol/L — ABNORMAL LOW (ref 3.5–5.1)
Sodium: 140 mmol/L (ref 135–145)

## 2019-06-14 LAB — ECHOCARDIOGRAM COMPLETE
Height: 66 in
Weight: 2155.22 oz

## 2019-06-14 LAB — LIPID PANEL
Cholesterol: 184 mg/dL (ref 0–200)
HDL: 64 mg/dL (ref >40)
LDL Cholesterol: 107 mg/dL — ABNORMAL HIGH (ref 0–99)
Total CHOL/HDL Ratio: 2.9 RATIO
Triglycerides: 67 mg/dL (ref <150)
VLDL: 13 mg/dL (ref 0–40)

## 2019-06-14 LAB — HEMOGLOBIN A1C
Hgb A1c MFr Bld: 5.4 % (ref 4.8–5.6)
Mean Plasma Glucose: 108.28 mg/dL

## 2019-06-14 LAB — HIV ANTIBODY (ROUTINE TESTING W REFLEX): HIV Screen 4th Generation wRfx: NONREACTIVE

## 2019-06-14 LAB — SARS CORONAVIRUS 2: SARS Coronavirus 2: NOT DETECTED

## 2019-06-14 LAB — MAGNESIUM: Magnesium: 2.1 mg/dL (ref 1.7–2.4)

## 2019-06-14 LAB — CK: Total CK: 122 U/L (ref 38–234)

## 2019-06-14 MED ORDER — TRAZODONE HCL 50 MG PO TABS
50.0000 mg | ORAL_TABLET | Freq: Once | ORAL | Status: AC
  Administered 2019-06-14: 50 mg via ORAL
  Filled 2019-06-14: qty 1

## 2019-06-14 MED ORDER — ACETAMINOPHEN 160 MG/5ML PO SOLN: 650.0000 mg | ORAL | Status: DC | PRN

## 2019-06-14 MED ORDER — STROKE: EARLY STAGES OF RECOVERY BOOK
Freq: Once | Status: AC
  Administered 2019-06-14: 1
  Filled 2019-06-14: qty 1

## 2019-06-14 MED ORDER — ASPIRIN 81 MG PO CHEW
324.0000 mg | CHEWABLE_TABLET | Freq: Once | ORAL | Status: AC
  Administered 2019-06-14: 324 mg via ORAL
  Filled 2019-06-14: qty 4

## 2019-06-14 MED ORDER — ATORVASTATIN CALCIUM 40 MG PO TABS
40.0000 mg | ORAL_TABLET | Freq: Every day | ORAL | Status: DC
  Administered 2019-06-14 – 2019-06-16 (×3): 40 mg via ORAL
  Filled 2019-06-14 (×3): qty 1

## 2019-06-14 MED ORDER — ASPIRIN 325 MG PO TABS
325.0000 mg | ORAL_TABLET | Freq: Every day | ORAL | Status: AC
  Administered 2019-06-15: 325 mg via ORAL
  Filled 2019-06-14: qty 1

## 2019-06-14 MED ORDER — ACETAMINOPHEN 325 MG PO TABS: 650.0000 mg | ORAL_TABLET | ORAL | Status: DC | PRN

## 2019-06-14 MED ORDER — ENOXAPARIN SODIUM 40 MG/0.4ML ~~LOC~~ SOLN
40.0000 mg | Freq: Every day | SUBCUTANEOUS | Status: DC
  Administered 2019-06-14 – 2019-06-15 (×2): 40 mg via SUBCUTANEOUS
  Filled 2019-06-14 (×2): qty 0.4

## 2019-06-14 MED ORDER — POTASSIUM CHLORIDE CRYS ER 20 MEQ PO TBCR
40.0000 meq | EXTENDED_RELEASE_TABLET | Freq: Once | ORAL | Status: AC
  Administered 2019-06-14: 40 meq via ORAL
  Filled 2019-06-14: qty 2

## 2019-06-14 MED ORDER — ACETAMINOPHEN 650 MG RE SUPP: 650.0000 mg | RECTAL | Status: DC | PRN

## 2019-06-14 MED ORDER — ATORVASTATIN CALCIUM 80 MG PO TABS
80.0000 mg | ORAL_TABLET | Freq: Every day | ORAL | Status: DC
  Administered 2019-06-14: 80 mg via ORAL
  Filled 2019-06-14: qty 1

## 2019-06-14 MED ORDER — ASPIRIN 325 MG PO TABS: 325.0000 mg | ORAL_TABLET | Freq: Every day | ORAL | Status: DC

## 2019-06-14 NOTE — Progress Notes (Signed)
VASCULAR LAB PRELIMINARY  PRELIMINARY  PRELIMINARY  PRELIMINARY  Carotid duplex completed.    Preliminary report:  See CV proc for preliminary results.   Kairav Russomanno, RVT 06/14/2019, 5:15 PM

## 2019-06-14 NOTE — Progress Notes (Signed)
Pt admitted to the unit from ED; pt independently ambulated from stretcher to bed in room. Pt alert and verbally responsive; pt oriented to the unit and room; fall/safety precaution and prevention education completed. Pt voices understanding and denies any questions. Pt skin clean, dry and intact with no pressure ulcer or opened wounds noted; bed alarm on; call light within reach. Will continue to closely monitor. Francis Gaines Bayden Gil RN   06/14/19 0404  Vitals  Temp 97.8 F (36.6 C)  Temp Source Oral  BP (!) 142/79  MAP (mmHg) 98  BP Location Left Arm  BP Method Automatic  Patient Position (if appropriate) Lying  Pulse Rate 66  Pulse Rate Source Monitor  ECG Heart Rate 67  Resp 13  Oxygen Therapy  SpO2 99 %  O2 Device Room Air  Pain Assessment  Pain Scale 0-10  Pain Score 0  Height and Weight  Height 5\' 6"  (1.676 m)  Weight 61.1 kg  Type of Scale Used Bed  Type of Weight Actual  BSA (Calculated - sq m) 1.69 sq meters  BMI (Calculated) 21.75  Weight in (lb) to have BMI = 25 154.6  MEWS Score  MEWS RR 1  MEWS Pulse 0  MEWS Systolic 0  MEWS LOC 0  MEWS Temp 0  MEWS Score 1  MEWS Score Color Green

## 2019-06-14 NOTE — Progress Notes (Addendum)
Patient admitted after midnight, please see H&P.  Here with new CVA.  Symptoms resolved, work up in progress.   pending neurology consult, echo, carotids: most likely with need TEE/loop on Monday-- continue tele.  Eulogio Bear DO

## 2019-06-14 NOTE — ED Notes (Signed)
ED TO INPATIENT HANDOFF REPORT  ED Nurse Name and Phone #: [phone]  S Name/Age/Gender Connie Meyer 68 y.o. female Room/Bed: 027C/027C  Code Status   Code Status: Not on file  Home/SNF/Other Home Patient oriented to: self, place, time and situation Is this baseline? Yes   Triage Complete: Triage complete  Chief Complaint Visual Disturbance  Triage Note [facility] reports pt recently dx w HTN and started amlodipine. Pt had grey sheeting and blurry vision in right eye that has resolved. This happened in the left once over the last week before starting the new med.   Allergies No Known Allergies  Level of Care/Admitting Diagnosis ED Disposition    ED Disposition Condition [facility] Area: [place] [[id number]]  Level of Care: Telemetry Medical [104]  I expect the patient will be discharged within 24 hours: Yes  LOW acuity---Tx typically complete <24 hrs---ACUTE conditions typically can be evaluated <24 hours---LABS likely to return to acceptable levels <24 hours---IS near functional baseline---EXPECTED to return to current living arrangement---NOT newly hypoxic: Meets criteria for 5C-Observation unit  Covid Evaluation: Screening Protocol (No Symptoms)  Diagnosis: Acute CVA (cerebrovascular accident) [facility]) [[id number]]  Admitting Physician: Shela Leff [[id number]]  Attending Physician: Shela Leff [[id number]]  PT Class (Do Not Modify): Observation [104]  PT Acc Code (Do Not Modify): Observation [10022]       B Medical/Surgery History Past Medical History:  Diagnosis Date  . [facility])    bilateral breast   Past Surgical History:  Procedure Laterality Date  . BREAST SURGERY       A IV Location/Drains/Wounds Patient Lines/Drains/Airways Status   Active Line/Drains/Airways    None          Intake/Output Last 24 hours No intake or output data in the 24 hours ending 06/14/19 0143  Labs/Imaging Results for orders  placed or performed during the hospital encounter of 06/13/19 (from the past 48 hour(s))  CBC     Status: Abnormal   Collection Time: 06/13/19  9:50 PM  Result Value Ref Range   WBC 5.3 4.0 - 10.5 K/uL   RBC 4.08 3.87 - 5.11 MIL/uL   Hemoglobin 13.0 12.0 - 15.0 g/dL   HCT 41.1 36.0 - 46.0 %   MCV 100.7 (H) 80.0 - 100.0 fL   MCH 31.9 26.0 - 34.0 pg   MCHC 31.6 30.0 - 36.0 g/dL   RDW 12.3 11.5 - 15.5 %   Platelets 191 150 - 400 K/uL   nRBC 0.0 0.0 - 0.2 %    Comment: Performed at [facility], [place] [address], [place], [place] [address]  Basic metabolic panel     Status: Abnormal   Collection Time: 06/13/19  9:50 PM  Result Value Ref Range   Sodium 142 135 - 145 mmol/L   Potassium 3.1 (L) 3.5 - 5.1 mmol/L   Chloride 104 98 - 111 mmol/L   CO2 25 22 - 32 mmol/L   Glucose, Bld 90 70 - 99 mg/dL   BUN 15 8 - 23 mg/dL   Creatinine, Ser 0.85 0.44 - 1.00 mg/dL   Calcium 9.5 8.9 - 10.3 mg/dL   GFR calc non Af Amer >60 >60 mL/min   GFR calc Af Amer >60 >60 mL/min   Anion gap 13 5 - 15    Comment: Performed at [facility] [address], [place], [place] [address]   Mr Brain Wo Contrast  Result Date: 06/14/2019 CLINICAL DATA:  68 y/o F; transient episode with loss of half of vision. TIA, initial exam. EXAM: MRI HEAD WITHOUT CONTRAST TECHNIQUE: Multiplanar, multiecho pulse sequences of the brain and surrounding structures were obtained without intravenous contrast. COMPARISON:  12/13/2003 CT head. FINDINGS: Brain: Small cluster of punctate foci of reduced diffusion within the left occipital lobe compatible with acute/early subacute infarction. No associated hemorrhage or mass effect. Punctate and early confluent nonspecific T2 FLAIR hyperintensities in subcortical and periventricular white matter are compatible with mild to moderate chronic microvascular ischemic changes. Mild volume loss of the brain. No extra-axial collection, hydrocephalus, mass effect, or herniation. Vascular:  Normal flow voids. Skull and upper cervical spine: Normal marrow signal. Sinuses/Orbits: Opacification of the left mastoid air cells and partial opacification of the right mastoid air cells. No abnormal signal of the included paranasal sinuses. Bilateral intra-ocular lens replacement. Other: None. IMPRESSION: 1. Small group of punctate acute/early subacute infarctions within the left occipital lobe. No hemorrhage or mass effect. 2. Mild-to-moderate chronic microvascular ischemic changes and volume loss of the brain. 3. Opacification of the left mastoid air cells and partial opacification of the right mastoid air cells. These results were called by telephone at the time of interpretation on 06/14/2019 at 12:30 am to Dr. Tyrone Nine, who verbally acknowledged these results. Electronically Signed   By: Kristine Garbe M.D.   On: 06/14/2019 00:31    Pending Labs Unresulted Labs (From admission, onward)    Start     Ordered   06/14/19 0049  SARS Coronavirus 2  Once,   R     06/14/19 0049          Vitals/Pain Today's Vitals   06/14/19 0052 06/14/19 0100 06/14/19 0115 06/14/19 0130  BP:  (!) 158/79 (!) 149/79 (!) 146/75  Pulse:  75 70 79  Resp:      Temp:      TempSrc:      SpO2:  97% 98% 97%  Weight:      Height:      PainSc: 0-No pain       Isolation Precautions No active isolations  Medications Medications - No data to display  Mobility walks Low fall risk   Focused Assessments    R Recommendations: See Admitting Provider Note  Report given to:   Additional Notes:

## 2019-06-14 NOTE — H&P (Signed)
History and Physical    Connie Meyer VQQ:[id number] DOB: November 03, 1951 DOA: 06/13/2019  PCP: [name], MD Patient coming from: Home  Chief Complaint: Blurry vision  HPI: Connie Meyer is a 68 y.o. female with medical history significant of hypertension, breast cancer presenting to the hospital via EMS for evaluation of blurry vision which had resolved prior to ED arrival.  Patient states about a week ago she experienced blurry vision in her left eye which lasted about 15 minutes.  She describes it as a gray curtain falling over the eye.  Then yesterday evening she was with her family and experienced the same thing again but in her right eye this time.  States again symptoms lasted about 15 minutes.  At present, her vision is normal.  She did not have any slurring of speech or facial droop at that time.  States her left hand was twitching at that time but she did not have any focal weakness or numbness.  Denies history of prior stroke.  Denies history of tobacco use.  She does not take aspirin.  She was diagnosed with hypertension a week ago and started on a medication.  Denies any fevers, chills, chest pain, shortness of breath, abdominal pain, nausea, vomiting, diarrhea, or dysuria.  No other complaints.  ED Course: Afebrile.  No leukocytosis.  Potassium 3.1.  COVID-19 rapid test pending.  Brain MRI showing small group of punctate acute/early subacute infarctions within the left occipital lobe.  No hemorrhage or mass-effect.  Neurology consulted by ED provider.  Review of Systems:  All systems reviewed and apart from history of presenting illness, are negative.  Past Medical History:  Diagnosis Date   Cancer [facility])    bilateral breast    Past Surgical History:  Procedure Laterality Date   BREAST SURGERY       reports that she has never smoked. She has never used smokeless tobacco. She reports current alcohol use. She reports that she does not use drugs.  No Known  Allergies  History reviewed. No pertinent family history.  Prior to Admission medications   Medication Sig Start Date End Date Taking? Authorizing Provider  amLODipine (NORVASC) 5 MG tablet Take 1 tablet (5 mg total) by mouth daily. 06/06/19  Yes [name], MD  meloxicam (MOBIC) 15 MG tablet Take 1 tablet (15 mg total) by mouth daily. 06/06/19  Yes [name], MD  hydrOXYzine (ATARAX/VISTARIL) 25 MG tablet Take 0.5-1 tablets (12.5-25 mg total) by mouth at bedtime as needed. Patient not taking: Reported on 06/06/2019 02/17/19   [name], MD    Physical Exam: Vitals:   06/14/19 0052 06/14/19 0100 06/14/19 0115 06/14/19 0130  BP:  (!) 158/79 (!) 149/79 (!) 146/75  Pulse: 69 75 70 79  Resp:      Temp:      TempSrc:      SpO2: 96% 97% 98% 97%  Weight:      Height:        Physical Exam  Constitutional: She is oriented to person, place, and time. She appears well-developed and well-nourished. No distress.  HENT:  Head: Normocephalic.  Mouth/Throat: Oropharynx is clear and moist.  Eyes: Pupils are equal, round, and reactive to light. EOM are normal. Right eye exhibits no discharge. Left eye exhibits no discharge.  Neck: Neck supple.  Cardiovascular: Normal rate, regular rhythm and intact distal pulses.  Pulmonary/Chest: Effort normal and breath sounds normal. No respiratory distress. She has no wheezes. She has  no rales.  Abdominal: Soft. Bowel sounds are normal. She exhibits no distension. There is no abdominal tenderness. There is no guarding.  Musculoskeletal:        General: No edema.  Neurological: She is alert and oriented to person, place, and time.  Speech fluent Very mild left facial droop Tongue deviates slightly to the right Strength 5 out of 5 in bilateral upper and lower extremities Sensation to light touch intact throughout  Skin: Skin is warm and dry. She is not diaphoretic.     Labs on Admission: I have personally reviewed following labs and  imaging studies  CBC: Recent Labs  Lab 06/13/19 2150  WBC 5.3  HGB 13.0  HCT 41.1  MCV 100.7*  PLT 660   Basic Metabolic Panel: Recent Labs  Lab 06/13/19 2150  NA 142  K 3.1*  CL 104  CO2 25  GLUCOSE 90  BUN 15  CREATININE 0.85  CALCIUM 9.5   GFR: Estimated Creatinine Clearance: 59.3 mL/min (by C-G formula based on SCr of 0.85 mg/dL). Liver Function Tests: No results for input(s): AST, ALT, ALKPHOS, BILITOT, PROT, ALBUMIN in the last 168 hours. No results for input(s): LIPASE, AMYLASE in the last 168 hours. No results for input(s): AMMONIA in the last 168 hours. Coagulation Profile: No results for input(s): INR, PROTIME in the last 168 hours. Cardiac Enzymes: No results for input(s): CKTOTAL, CKMB, CKMBINDEX, TROPONINI in the last 168 hours. BNP (last 3 results) No results for input(s): PROBNP in the last 8760 hours. HbA1C: No results for input(s): HGBA1C in the last 72 hours. CBG: No results for input(s): GLUCAP in the last 168 hours. Lipid Profile: No results for input(s): CHOL, HDL, LDLCALC, TRIG, CHOLHDL, LDLDIRECT in the last 72 hours. Thyroid Function Tests: No results for input(s): TSH, T4TOTAL, FREET4, T3FREE, THYROIDAB in the last 72 hours. Anemia Panel: No results for input(s): VITAMINB12, FOLATE, FERRITIN, TIBC, IRON, RETICCTPCT in the last 72 hours. Urine analysis:    Component Value Date/Time   COLORURINE YELLOW 07/18/2017 [place] 07/18/2017 1047   LABSPEC 1.013 07/18/2017 1047   PHURINE 6.0 07/18/2017 1047   GLUCOSEU NEGATIVE 07/18/2017 1047   HGBUR SMALL (A) 07/18/2017 1047   BILIRUBINUR NEGATIVE 07/18/2017 1047   KETONESUR 5 (A) 07/18/2017 1047   PROTEINUR NEGATIVE 07/18/2017 1047   NITRITE NEGATIVE 07/18/2017 1047   LEUKOCYTESUR NEGATIVE 07/18/2017 1047    Radiological Exams on Admission: Mr Brain Wo Contrast  Result Date: 06/14/2019 CLINICAL DATA:  68 y/o F; transient episode with loss of half of vision. TIA,  initial exam. EXAM: MRI HEAD WITHOUT CONTRAST TECHNIQUE: Multiplanar, multiecho pulse sequences of the brain and surrounding structures were obtained without intravenous contrast. COMPARISON:  12/13/2003 CT head. FINDINGS: Brain: Small cluster of punctate foci of reduced diffusion within the left occipital lobe compatible with acute/early subacute infarction. No associated hemorrhage or mass effect. Punctate and early confluent nonspecific T2 FLAIR hyperintensities in subcortical and periventricular white matter are compatible with mild to moderate chronic microvascular ischemic changes. Mild volume loss of the brain. No extra-axial collection, hydrocephalus, mass effect, or herniation. Vascular: Normal flow voids. Skull and upper cervical spine: Normal marrow signal. Sinuses/Orbits: Opacification of the left mastoid air cells and partial opacification of the right mastoid air cells. No abnormal signal of the included paranasal sinuses. Bilateral intra-ocular lens replacement. Other: None. IMPRESSION: 1. Small group of punctate acute/early subacute infarctions within the left occipital lobe. No hemorrhage or mass effect. 2. Mild-to-moderate chronic microvascular ischemic  changes and volume loss of the brain. 3. Opacification of the left mastoid air cells and partial opacification of the right mastoid air cells. These results were called by telephone at the time of interpretation on 06/14/2019 at 12:30 am to Dr. Tyrone Nine, who verbally acknowledged these results. Electronically Signed   By: Kristine Garbe M.D.   On: 06/14/2019 00:31    EKG: Independently reviewed.  Sinus rhythm (heart rate 71).  No prior EKG for comparison.  Assessment/Plan Principal Problem:   Acute CVA (cerebrovascular accident) [facility]) Active Problems:   HTN (hypertension)   Hypokalemia   Acute CVA Patient experienced transient loss in her left eye a week ago and again around 8 PM on June 13, 2019.  Symptoms resolved prior to ED  arrival and her vision is currently back to baseline.  On exam, noted to have very mild left-sided facial droop and deviation of the tongue slightly to the right.  No other focal weakness or numbness.  MRI showing small group of punctate acute/early subacute infarctions within the left occipital lobe.  No hemorrhage or mass-effect. -Telemetry monitoring -Allow for permissive hypertension-treat if SBP >220 -Carotid Dopplers -2D echocardiogram -Hemoglobin A1c, fasting lipid panel -Aspirin 325 p.o. now and daily -Atorvastatin 80 mg now and daily -Frequent neurochecks -PT, OT, speech therapy. -N.p.o. until cleared by bedside swallow evaluation or formal speech evaluation -Neurology has been consulted, appreciate recommendations  Hypertension Systolic currently in 017P. -Allow permissive hypertension in the setting of acute CVA  Hypokalemia Potassium 3.1.   -Replete potassium.  Check magnesium level.  Continue to monitor BMP.  DVT prophylaxis: Lovenox Code Status: Full code Family Communication: No family available. Disposition Plan: Anticipate discharge in 1 to 2 days. Consults called: Neurology (Dr. Cheral Marker) Admission status: It is my clinical opinion that referral for OBSERVATION is reasonable and necessary in this patient based on the above information provided. The aforementioned taken together are felt to place the patient at high risk for further clinical deterioration. However it is anticipated that the patient may be medically stable for discharge from the hospital within 24 to 48 hours.  The medical decision making on this patient was of high complexity and the patient is at high risk for clinical deterioration, therefore this is a level 3 visit.  Shela Leff MD [facility] Pager [phone]  If 7PM-7AM, please contact night-coverage [email or web address] Password [email or web address]  06/14/2019, 2:10 AM

## 2019-06-14 NOTE — ED Notes (Signed)
Attempted to call report. Bed not approved by charge.

## 2019-06-14 NOTE — Evaluation (Signed)
Speech Language Pathology Evaluation Patient Details Name: SYREETA FIGLER MRN: [id number] DOB: [birth year] Today's Date: 06/14/2019 Time: 5170-0174 SLP Time Calculation (min) (ACUTE ONLY): 20 min  Problem List:  Patient Active Problem List   Diagnosis Date Noted  . Acute CVA (cerebrovascular accident) ([place]) 06/14/2019  . HTN (hypertension) 06/14/2019  . Hypokalemia 06/14/2019  . H/O bilateral mastectomy 02/17/2019  . Rib pain on left side 09/18/2016  . Hiatal hernia 09/05/2016  . History of breast cancer in female-2000, 2003 03/17/2016   Past Medical History:  Past Medical History:  Diagnosis Date  . Cancer [facility])    bilateral breast   Past Surgical History:  Past Surgical History:  Procedure Laterality Date  . BREAST SURGERY     HPI:  Ms. Brieanna Nau, 68 y/f presented to ED with blurry vision. Vision changes lasted 15 minutes and resolved (left eye). She had no slurrying of speech or facial droop at this time. Medical history of hypertension and breast cancer. MRI revealed a small group of punctate acute/early subacute infarctions within   Assessment / Plan / Recommendation Clinical Impression  Ms. Scarfo presented to ED with blurred vision and imaging revealed a puctuate acute subacute infarcet within the left occipital lobe. Her vision has returned to baseline. She reports no observed changes in her cognition and language. MOCA was administered with a score of 29/30 (With 26 or above being [facility]). She is able to communicate at the conversational level and recall all the events leading to her hospitalization and current situation. No further concerns for her language and cognition. ST to sign off at this time.     SLP Assessment  SLP Recommendation/Assessment: Patient does not need any further Speech Lanaguage Pathology Services SLP Visit Diagnosis: Cognitive communication deficit (R41.841)    Follow Up Recommendations  None               SLP  Evaluation Cognition  Overall Cognitive Status: Within Functional Limits for tasks assessed Arousal/Alertness: Awake/alert Orientation Level: Oriented X4 Attention: Focused Focused Attention: Appears intact Memory: Appears intact Awareness: Appears intact Problem Solving: Appears intact Executive Function: Reasoning Reasoning: Appears intact Safety/Judgment: Appears intact       Comprehension  Auditory Comprehension Overall Auditory Comprehension: Appears within functional limits for tasks assessed Yes/No Questions: Within Functional Limits Commands: Within Functional Limits Conversation: Complex Visual Recognition/Discrimination Discrimination: Within Function Limits Reading Comprehension Reading Status: Within funtional limits    Expression Expression Primary Mode of Expression: Verbal Verbal Expression Overall Verbal Expression: Appears within functional limits for tasks assessed Initiation: No impairment Level of Generative/Spontaneous Verbalization: Conversation Repetition: No impairment Naming: No impairment Pragmatics: No impairment Non-Verbal Means of Communication: Not applicable Written Expression Dominant Hand: Right Written Expression: Within Functional Limits   Oral / Motor  Oral Motor/Sensory Function Overall Oral Motor/Sensory Function: Within functional limits Motor Speech Overall Motor Speech: Appears within functional limits for tasks assessed Respiration: Within functional limits Phonation: Normal Resonance: Within functional limits Articulation: Within functional limitis Intelligibility: Intelligible Motor Planning: Witnin functional limits Motor Speech Errors: Not applicable   GO                    Charlynne Cousins Jamaine Quintin, MA, CCC-SLP 06/14/2019 9:06 AM

## 2019-06-14 NOTE — ED Notes (Signed)
Patient transported to MRI 

## 2019-06-14 NOTE — Progress Notes (Signed)
OT Screen Note  Patient Details Name: Connie Meyer MRN: [id number] DOB: [birth year]   Cancelled Treatment:    Reason Eval/Treat Not Completed: OT screened, no needs identified, will sign off(Per PT, all symptoms have resolved. No need for OT.)   Darryl Nestle) Marsa Aris OTR/L Acute Rehabilitation Services Pager: [phone] Office: [place] 06/14/2019, 11:57 AM

## 2019-06-14 NOTE — Progress Notes (Signed)
  Echocardiogram 2D Echocardiogram has been performed.  Connie Meyer 06/14/2019, 12:35 PM

## 2019-06-14 NOTE — ED Notes (Signed)
Attempted to call report

## 2019-06-14 NOTE — ED Provider Notes (Signed)
68 yo F with a chief complaints of feeling like if she is being pulled over her eye.  This happened to one eye about a week ago and then had happened to the other eye today.  Has resolved on arrival.  No neuro deficit.  I see the patient in signout from Dr. Tomi Bamberger, plan is for an MRI of the brain.  MRI shows multiple punctate occipital strokes.  Discussed with Dr. Cheral Marker, neurology recommended admission to the hospital.   Deno Etienne, DO 06/14/19 [phone]

## 2019-06-14 NOTE — Progress Notes (Signed)
STROKE TEAM PROGRESS NOTE   HISTORY OF PRESENT ILLNESS (per Dr Cheral Marker) Connie Meyer is an 68 y.o. female presenting with transient "curtain-loss" vision in her left eye about 2 weeks ago ago and in her right eye on Friday. She saw her PCP after the first episode, was diagnosed with HTN and started on Norvasc. Today, she had sensation of "gray sheeting" and blurry vision involving the temporal visual field of her right eye that then resolved after 15 minutes. She took her blood pressure and it was elevated at 178/90. She had a headache during the episode, which has since resolved. She also describes this a having temporarily lost half of her vision in that eye. She did not notice any vision loss involving her left eye at that time. The episode > 1 week ago was similar to the current spell, but involved the temporal visual field of her left eye, which also spontaneously resolved. She does not endorse having had any additional neurological symptoms, including no speech difficulty, limb weakness or limb numbness.    MRI brain was obtained in the ED. On my review of the images, there is a small patch of bland DWI hyperintensity in the left occipital lobe. The DDx for this finding is primarily a small left occipital lobe acute infarction with associated petechial hemorrhage, versus small focal hemorrhage with associated artifactual hyperintensity on DWI.  The official report describes this finding as an acute infarction.   MRI brain official report conclusions: 1. Small group of punctate acute/early subacute infarctions within the left occipital lobe. No hemorrhage or mass effect. 2. Mild-to-moderate chronic microvascular ischemic changes and loss of the brain. 3. Opacification of the left mastoid air cells and partial opacification of the right mastoid air cells.   SUBJECTIVE (INTERVAL HISTORY) I have personally reviewed history of presenting illness with the patient in details.  I have also  reviewed imaging films and PACS.  She states she still has some right-sided peripheral vision loss but it appears to be improving.  She denies any prior history of strokes or TIAs or atrial fibrillation, syncope or palpitations or cardiac disease    OBJECTIVE Vitals:   06/14/19 0300 06/14/19 0315 06/14/19 0404 06/14/19 0600  BP: 135/63 140/72 (!) 142/79 (!) 145/78  Pulse: 66 71 66   Resp:   13 20  Temp:   97.8 F (36.6 C) 98.2 F (36.8 C)  TempSrc:   Oral Oral  SpO2: 97% 96% 99%   Weight:   61.1 kg   Height:   5\' 6"  (1.676 m)     CBC:  Recent Labs  Lab 06/13/19 2150  WBC 5.3  HGB 13.0  HCT 41.1  MCV 100.7*  PLT 854    Basic Metabolic Panel:  Recent Labs  Lab 06/13/19 2150 06/14/19 0300  NA 142 140  K 3.1* 3.1*  CL 104 107  CO2 25 24  GLUCOSE 90 108*  BUN 15 14  CREATININE 0.85 0.75  CALCIUM 9.5 9.2  MG  --  2.1    Lipid Panel:     Component Value Date/Time   CHOL 184 06/14/2019 0300   CHOL 176 02/17/2019 1640   TRIG 67 06/14/2019 0300   HDL 64 06/14/2019 0300   HDL 62 02/17/2019 1640   CHOLHDL 2.9 06/14/2019 0300   VLDL 13 06/14/2019 0300   LDLCALC 107 (H) 06/14/2019 0300   LDLCALC 100 (H) 02/17/2019 1640   HgbA1c:  Lab Results  Component Value Date   HGBA1C  5.4 06/14/2019   Urine Drug Screen: No results found for: LABOPIA, COCAINSCRNUR, LABBENZ, AMPHETMU, THCU, LABBARB  Alcohol Level No results found for: [facility]  IMAGING  Mr Brain Wo Contrast 06/14/2019 IMPRESSION:  1. Small group of punctate acute/early subacute infarctions within the left occipital lobe. No hemorrhage or mass effect.  2. Mild-to-moderate chronic microvascular ischemic changes and volume loss of the brain.  3. Opacification of the left mastoid air cells and partial opacification of the right mastoid air cells.    Transthoracic Echocardiogram  06/14/2019 IMPRESSIONS  1. The left ventricle has normal systolic function, with an ejection fraction of 55-60%. The cavity size was  normal. Left ventricular diastolic Doppler parameters are consistent with impaired relaxation. Indeterminate filling pressures The E/e' is 8-15.  2. The right ventricle has normal systolic function. The cavity was normal. There is no increase in right ventricular wall thickness.  3. The mitral valve is grossly normal.  4. The tricuspid valve is grossly normal.  5. The aortic valve is grossly normal. No stenosis of the aortic valve.   Bilateral Carotid Dopplers  00/00/2020 Pending   Transcranial Dopplers - pending   EKG - SR rate 71 BPM. (See cardiology reading for complete details)    PHYSICAL EXAM Blood pressure (!) 145/78, pulse 66, temperature 98.2 F (36.8 C), temperature source Oral, resp. rate 20, height 5\' 6"  (1.676 m), weight 61.1 kg, SpO2 99 %. Pleasant frail middle-aged Caucasian lady not in distress. . Afebrile. Head is nontraumatic. Neck is supple without bruit.    Cardiac exam no murmur or gallop. Lungs are clear to auscultation. Distal pulses are well felt. Neurological Exam ;  Awake  Alert oriented x 3. Normal speech and language.eye movements full without nystagmus.fundi were not visualized. Vision acuity   appears normal.  Right partial superior quadrantanopsia to bedside confrontation testing.  Hearing is normal. Palatal movements are normal. Face symmetric. Tongue midline. Normal strength, tone, reflexes and coordination. Normal sensation. Gait deferred.     ASSESSMENT/PLAN [name] is a 68 y.o. female with no significant past medical hx other than previous breast cancer presenting with transient visual changes in the setting of recently diagnosed hypertension. She did not receive IV t-PA due to late presentation (>4.5 hours from time of onset).   Strokes:  punctate acute/early subacute infarctions within the left occipital lobe - embolic - source unknown  Resultant  Partial right superior qudrantonopsia  CT head - not performed  MRI head -  Small group of punctate acute/early subacute infarctions within the left occipital lobe.  MRA head - not performed  CTA H&N - not performed  Trans-Cranial Dopplers - pending  Carotid Doppler - pending  2D Echo  - EF 55-60% . No cardiac source of emboli identified.   [employer] Virus 2 - not detected  LDL - 107  HgbA1c - 5.4  UDS - not performed  VTE prophylaxis - Lovenox  Diet -  - Heart healthy with thin liquids.  No antithrombotic prior to admission, now on aspirin 325 mg daily  Patient counseled to be compliant with her antithrombotic medications  Ongoing aggressive stroke risk factor management  Therapy recommendations:  No F/U recommended  Disposition:  Pending  Hypertension  Stable . Permissive hypertension (OK if < 220/120) but gradually normalize in 5-7 days . Long-term BP goal normotensive  Hyperlipidemia  Lipid lowering medication PTA:  none  LDL 107, goal < 70  Current lipid lowering medication: Lipitor 80 mg daily  Continue  statin at discharge   Other Stroke Risk Factors  Advanced age  ETOH use, advised to drink no more than 1 alcoholic beverage per day.   Other Active Problems  Hypokalemia - 3.1 - supplemented  Opacification of the left mastoid air cells and partial opacification of the right mastoid air cells.   PLAN  Consider TEE / loop if 2DEcho is unyielding   Hospital day # 0 I have personally obtained history,examined this patient, reviewed notes, independently viewed imaging studies, participated in medical decision making and plan of care.ROS completed by me personally and pertinent positives fully documented  I have made any additions or clarifications directly to the above note.  She presented with initially transient left eye vision loss then followed by right eye vision loss which lasted longer.  MRI shows embolic left occipital infarct source not determined yet.  Recommend continue ongoing stroke with stratification  work-up and check TEE and loop recorder next week if 2D echo and telemetry monitoring is unyielding.  Recommend aspirin and Plavix for 3 weeks followed by aspirin alone and aggressive risk factor modification.  Discussed with Dr. Eliseo Squires.  Greater than 50% time during this 35-minute visit was spent on counseling and coordination of care about her embolic strokes and discussion about evaluation and treatment plan and answering questions.  Antony Contras, MD Medical Director [place] Pager: [phone] 06/14/2019 2:33 PM   To contact Stroke Continuity provider, please refer to [email or web address] After hours, contact General Neurology

## 2019-06-14 NOTE — Evaluation (Signed)
Physical Therapy Evaluation Patient Details Name: Connie Meyer MRN: [id number] DOB: 1951/03/28 Today's Date: 06/14/2019   History of Present Illness  Patient presented to the hopsital yesterday follwing a visual field diruption. She reported no weakness or numbness in her extremitys. This morning her symptoms have resolved. 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 PMH: breast cancer   Clinical Impression  Patients symptoms appear to have resolved. She is having no visual deficits. She had no loss of balance with high level balance activity. She has no need for further skilled therapy.     Follow Up Recommendations No PT follow up    Equipment Recommendations  None recommended by PT    Recommendations for Other Services       Precautions / Restrictions Precautions Precautions: None Restrictions Weight Bearing Restrictions: No      Mobility  Bed Mobility Overal bed mobility: Independent             General bed mobility comments: got in and out of bed without difficulty   Transfers Overall transfer level: Independent Equipment used: None             General transfer comment: stood without syncope or losss of balance  Ambulation/Gait Ambulation/Gait assistance: Independent Gait Distance (Feet): 150 Feet   Gait Pattern/deviations: WFL(Within Functional Limits) Gait velocity: normal    General Gait Details: ambauilted with head turns and nods without loss of balance. Able to change speeds   Stairs            Wheelchair Mobility    Modified Rankin (Stroke Patients Only)       Balance Overall balance assessment: Independent                                           Pertinent Vitals/Pain Pain Assessment: No/denies pain    Home Living Family/patient expects to be discharged to:: Private residence Living Arrangements: Alone Available Help at Discharge: Family Type of Home: House Home Access: Stairs to  enter   Technical brewer of Steps: 12 Home Layout: One level        Prior Function Level of Independence: Independent               Hand Dominance   Dominant Hand: Right    Extremity/Trunk Assessment   Upper Extremity Assessment Upper Extremity Assessment: Overall WFL for tasks assessed    Lower Extremity Assessment Lower Extremity Assessment: Overall WFL for tasks assessed    Cervical / Trunk Assessment Cervical / Trunk Assessment: Normal  Communication   Communication: No difficulties  Cognition Arousal/Alertness: Awake/alert Behavior During Therapy: WFL for tasks assessed/performed Overall Cognitive Status: Within Functional Limits for tasks assessed                                        General Comments General comments (skin integrity, edema, etc.): good tracking. No double vision. Good peripheral vision  Tandem stance normal, narrow base eyes open and closed normal. Good coordination with stepping.     Exercises     Assessment/Plan    PT Assessment Patent does not need any further PT services  PT Problem List         PT Treatment Interventions      PT Goals (Current goals can be  found in the Care Plan section)  Acute Rehab PT Goals Patient Stated Goal: to go home  PT Goal Formulation: With patient Time For Goal Achievement: 06/21/19 Potential to Achieve Goals: Good    Frequency     Barriers to discharge Inaccessible home environment      Co-evaluation               AM-PAC PT "6 Clicks" Mobility  Outcome Measure Help needed turning from your back to your side while in a flat bed without using bedrails?: None Help needed moving from lying on your back to sitting on the side of a flat bed without using bedrails?: None Help needed moving to and from a bed to a chair (including a wheelchair)?: None Help needed standing up from a chair using your arms (e.g., wheelchair or bedside chair)?: None Help needed to walk  in hospital room?: None Help needed climbing 3-5 steps with a railing? : None 6 Click Score: 24    End of Session Equipment Utilized During Treatment: Gait belt Activity Tolerance: Patient tolerated treatment well Patient left: in bed;in chair;with call bell/phone within reach Nurse Communication: Mobility status PT Visit Diagnosis: Other abnormalities of gait and mobility (R26.89)    Time: 7494-4967 PT Time Calculation (min) (ACUTE ONLY): 10 min   Charges:   PT Evaluation $PT Eval Low Complexity: 1 Low           Carney Living PT DPT  06/14/2019, 11:35 AM

## 2019-06-14 NOTE — Consult Note (Signed)
NEURO HOSPITALIST CONSULT NOTE   Requestig physician: Dr. Tyrone Nine  Reason for Consult: Episodes of amaurosis fugax in left and right eyes  History obtained from:  Patient and Chart    HPI:                                                                                                                                          Connie Meyer is an 68 y.o. female presenting with transient "curtain-loss" vision in her left eye about 2 weeks ago ago and in her right eye on Friday. She saw her PCP after the first episode, was diagnosed with HTN and started on Norvasc. Today, she had sensation of "gray sheeting" and blurry vision involving the temporal visual field of her right eye that then resolved after 15 minutes. She took her blood pressure and it was elevated at 178/90. She had a headache during the episode, which has since resolved. She also describes this a having temporarily lost half of her vision in that eye. She did not notice any vision loss involving her left eye at that time. The episode > 1 week ago was similar to the current spell, but involved the temporal visual field of her left eye, which also spontaneously resolved. She does not endorse having had any additional neurological symptoms, including no speech difficulty, limb weakness or limb numbness.      MRI brain was obtained in the ED. On my review of the images, there is a small patch of bland DWI hyperintensity in the left occipital lobe. The DDx for this finding is primarily a small left occipital lobe acute infarction with associated petechial hemorrhage, versus small focal hemorrhage with associated artifactual hyperintensity on DWI.  The official report describes this finding as an acute infarction.   MRI brain official report conclusions: 1. Small group of punctate acute/early subacute infarctions within the left occipital lobe. No hemorrhage or mass effect. 2. Mild-to-moderate chronic microvascular  ischemic changes and volume loss of the brain. 3. Opacification of the left mastoid air cells and partial opacification of the right mastoid air cells.  Past Medical History:  Diagnosis Date  . Cancer [facility])    bilateral breast    Past Surgical History:  Procedure Laterality Date  . BREAST SURGERY      History reviewed. No pertinent family history.            Social History:  reports that she has never smoked. She has never used smokeless tobacco. She reports current alcohol use. She reports that she does not use drugs.  No Known Allergies  HOME MEDICATIONS:  Amlodipine Mobic Atarax   ROS:                                                                                                                                       As per HPI. Comprehensive ROS is otherwise negative.    Blood pressure (!) 155/83, pulse 69, temperature 98.2 F (36.8 C), temperature source Oral, resp. rate 20, height 5\' 6"  (1.676 m), weight 61.7 kg, SpO2 96 %.   General Examination:                                                                                                       Physical Exam  HEENT-  [place]/AT    Lungs- Respirations unlabored Extremities- No edema  Neurological Examination Mental Status: Alert, fully oriented, thought content appropriate.  Speech fluent without evidence of aphasia.  Able to follow all commands without difficulty. Cranial Nerves: II: Visual fields intact all 4 quadrants of each eye tested individually. No extinction to DSS. PERRL.   III,IV, VI: No ptosis. EOMI without nystagmus.  V,VII: No facial droop. Temp sensation equal bilaterally VIII: hearing intact to voice IX,X: Palate rises symmetrically XI: Symmetric shoulder shrug XII: Midline tongue extension Motor: Right : Upper extremity   5/5    Left:     Upper extremity   5/5  Lower extremity    5/5     Lower extremity   5/5 No pronator drift Sensory: Temp and light touch intact x 4. No extinction.  Deep Tendon Reflexes: 2+ and symmetric biceps and brachioradialis. 3+ patellae, 1+ achilles bilaterally.  Plantars: Right: downgoing   Left: downgoing Cerebellar: No ataxia with FNF and H-S bilaterally  Gait: Deferred   Lab Results: Basic Metabolic Panel: Recent Labs  Lab 06/13/19 2150  NA 142  K 3.1*  CL 104  CO2 25  GLUCOSE 90  BUN 15  CREATININE 0.85  CALCIUM 9.5    CBC: Recent Labs  Lab 06/13/19 2150  WBC 5.3  HGB 13.0  HCT 41.1  MCV 100.7*  PLT 191    Cardiac Enzymes: No results for input(s): CKTOTAL, CKMB, CKMBINDEX, TROPONINI in the last 168 hours.  Lipid Panel: No results for input(s): CHOL, TRIG, HDL, CHOLHDL, VLDL, LDLCALC in the last 168 hours.  Imaging: Mr Brain Wo Contrast  Result Date: 06/14/2019 CLINICAL DATA:  68 y/o F; transient episode with loss of half of vision. TIA, initial exam. EXAM: MRI HEAD WITHOUT CONTRAST TECHNIQUE: Multiplanar, multiecho pulse sequences of  the brain and surrounding structures were obtained without intravenous contrast. COMPARISON:  12/13/2003 CT head. FINDINGS: Brain: Small cluster of punctate foci of reduced diffusion within the left occipital lobe compatible with acute/early subacute infarction. No associated hemorrhage or mass effect. Punctate and early confluent nonspecific T2 FLAIR hyperintensities in subcortical and periventricular white matter are compatible with mild to moderate chronic microvascular ischemic changes. Mild volume loss of the brain. No extra-axial collection, hydrocephalus, mass effect, or herniation. Vascular: Normal flow voids. Skull and upper cervical spine: Normal marrow signal. Sinuses/Orbits: Opacification of the left mastoid air cells and partial opacification of the right mastoid air cells. No abnormal signal of the included paranasal sinuses. Bilateral intra-ocular lens replacement. Other:  None. IMPRESSION: 1. Small group of punctate acute/early subacute infarctions within the left occipital lobe. No hemorrhage or mass effect. 2. Mild-to-moderate chronic microvascular ischemic changes and volume loss of the brain. 3. Opacification of the left mastoid air cells and partial opacification of the right mastoid air cells. These results were called by telephone at the time of interpretation on 06/14/2019 at 12:30 am to Dr. Tyrone Nine, who verbally acknowledged these results. Electronically Signed   By: Kristine Garbe M.D.   On: 06/14/2019 00:31    Assessment: 68 year old female with small left occipital lobe acute stroke versus small hemorrhage with associated DWI artifact. MRI findings correlate with her presentation of right hemifield transient vision loss 1. Neurological exam is nonfocal. Her visual symptoms have resolved 2. Stroke risk factors: History of breast cancer, HTN  Recommendations: -- Permissive HTN x 24 hours -- Agree with starting ASA 325 mg po qd -- Would decrease atorvastatin to 40 mg po qd due to her age.  -- Would obtain baseline CK level.  -- HgbA1c, fasting lipid panel -- MRA of the brain without contrast -- PT consult, OT consult, Speech consult -- Echocardiogram -- Carotid ultrasound -- Risk factor modification -- Telemetry monitoring -- Frequent neuro checks -- NPO until passes stroke swallow screen -- Please page stroke NP  Or  PA  Or MD from 8am -4 pm  as this patient from this time will be  followed by the stroke.   You can look them up on [email or web address]  Password [email or web address]     Electronically signed: Dr. Kerney Elbe 06/14/2019, 1:42 AM

## 2019-06-15 ENCOUNTER — Inpatient Hospital Stay (HOSPITAL_COMMUNITY): Payer: Medicare Other

## 2019-06-15 DIAGNOSIS — I639 Cerebral infarction, unspecified: Secondary | ICD-10-CM

## 2019-06-15 MED ORDER — CLOPIDOGREL BISULFATE 75 MG PO TABS
75.0000 mg | ORAL_TABLET | Freq: Every day | ORAL | Status: DC
  Administered 2019-06-16: 75 mg via ORAL
  Filled 2019-06-15: qty 1

## 2019-06-15 MED ORDER — POTASSIUM CHLORIDE CRYS ER 20 MEQ PO TBCR
40.0000 meq | EXTENDED_RELEASE_TABLET | Freq: Once | ORAL | Status: AC
  Administered 2019-06-15: 40 meq via ORAL
  Filled 2019-06-15: qty 2

## 2019-06-15 MED ORDER — ASPIRIN EC 81 MG PO TBEC
81.0000 mg | DELAYED_RELEASE_TABLET | Freq: Every day | ORAL | Status: DC
  Administered 2019-06-16: 81 mg via ORAL
  Filled 2019-06-15: qty 1

## 2019-06-15 NOTE — Progress Notes (Signed)
TCD completed. Preliminary results in Chart results CV Proc. [facility] [place] 06/15/2019, 4:31 PM

## 2019-06-15 NOTE — Progress Notes (Signed)
STROKE TEAM PROGRESS NOTE     SUBJECTIVE (INTERVAL HISTORY) Patient is having her echo done right now.  She has no complaints.  She feels she is made full recovery  Transthoracic echo and carotid Dopplers are both unremarkable.  OBJECTIVE Vitals:   06/14/19 1647 06/14/19 2047 06/15/19 0400 06/15/19 0700  BP: 122/67 125/65 (!) 123/57 124/87  Pulse: 67 70 60 79  Resp: 19 18 18 18   Temp: 98.5 F (36.9 C) 97.8 F (36.6 C) (!) 97.5 F (36.4 C)   TempSrc: Oral Oral Oral   SpO2: 98% 96% 97% 95%  Weight:      Height:        CBC:  Recent Labs  Lab 06/13/19 2150  WBC 5.3  HGB 13.0  HCT 41.1  MCV 100.7*  PLT 287    Basic Metabolic Panel:  Recent Labs  Lab 06/13/19 2150 06/14/19 0300  NA 142 140  K 3.1* 3.1*  CL 104 107  CO2 25 24  GLUCOSE 90 108*  BUN 15 14  CREATININE 0.85 0.75  CALCIUM 9.5 9.2  MG  --  2.1    Lipid Panel:     Component Value Date/Time   CHOL 184 06/14/2019 0300   CHOL 176 02/17/2019 1640   TRIG 67 06/14/2019 0300   HDL 64 06/14/2019 0300   HDL 62 02/17/2019 1640   CHOLHDL 2.9 06/14/2019 0300   VLDL 13 06/14/2019 0300   LDLCALC 107 (H) 06/14/2019 0300   LDLCALC 100 (H) 02/17/2019 1640   HgbA1c:  Lab Results  Component Value Date   HGBA1C 5.4 06/14/2019   Urine Drug Screen: No results found for: LABOPIA, COCAINSCRNUR, LABBENZ, AMPHETMU, THCU, LABBARB  Alcohol Level No results found for: [facility]  IMAGING  Mr Brain Wo Contrast 06/14/2019 IMPRESSION:  1. Small group of punctate acute/early subacute infarctions within the left occipital lobe. No hemorrhage or mass effect.  2. Mild-to-moderate chronic microvascular ischemic changes and volume loss of the brain.  3. Opacification of the left mastoid air cells and partial opacification of the right mastoid air cells.    Transthoracic Echocardiogram  06/14/2019 IMPRESSIONS  1. The left ventricle has normal systolic function, with an ejection fraction of 55-60%. The cavity size was normal.  Left ventricular diastolic Doppler parameters are consistent with impaired relaxation. Indeterminate filling pressures The E/e' is 8-15.  2. The right ventricle has normal systolic function. The cavity was normal. There is no increase in right ventricular wall thickness.  3. The mitral valve is grossly normal.  4. The tricuspid valve is grossly normal.  5. The aortic valve is grossly normal. No stenosis of the aortic valve.   Bilateral Carotid Dopplers  No significant bilateral extracranial carotid stenosis.  Transcranial Dopplers - pending   EKG - SR rate 71 BPM. (See cardiology reading for complete details)    PHYSICAL EXAM Blood pressure 124/87, pulse 79, temperature (!) 97.5 F (36.4 C), temperature source Oral, resp. rate 18, height 5\' 6"  (1.676 m), weight 61.1 kg, SpO2 95 %. Pleasant frail middle-aged Caucasian lady not in distress. . Afebrile. Head is nontraumatic. Neck is supple without bruit.    Cardiac exam no murmur or gallop. Lungs are clear to auscultation. Distal pulses are well felt. Neurological Exam ;  Awake  Alert oriented x 3. Normal speech and language.eye movements full without nystagmus.fundi were not visualized. Vision acuity   appears normal.  Right partial superior quadrantanopsia nw improved to bedside confrontation testing.  Hearing is normal. Palatal movements  are normal. Face symmetric. Tongue midline. Normal strength, tone, reflexes and coordination. Normal sensation. Gait deferred.     ASSESSMENT/PLAN [name] is a 68 y.o. female with no significant past medical hx other than previous breast cancer presenting with transient visual changes in the setting of recently diagnosed hypertension. She did not receive IV t-PA due to late presentation (>4.5 hours from time of onset).   Strokes:  punctate acute/early subacute infarctions within the left occipital lobe - embolic - source unknown  Resultant  Partial right superior qudrantanopsia  resolved  CT head - not performed  MRI head - Small group of punctate acute/early subacute infarctions within the left occipital lobe.  MRA head - not performed  CTA H&N - not performed  Trans-Cranial Dopplers - pending  Carotid Doppler - pending  2D Echo  - EF 55-60% . No cardiac source of emboli identified.   [employer] Virus 2 - not detected  LDL - 107  HgbA1c - 5.4  UDS - not performed  VTE prophylaxis - Lovenox  Diet -  - Heart healthy with thin liquids.  No antithrombotic prior to admission, now on aspirin 325 mg daily  Patient counseled to be compliant with her antithrombotic medications  Ongoing aggressive stroke risk factor management  Therapy recommendations:  No F/U recommended  Disposition:  Pending  Hypertension  Stable . Permissive hypertension (OK if < 220/120) but gradually normalize in 5-7 days . Long-term BP goal normotensive  Hyperlipidemia  Lipid lowering medication PTA:  none  LDL 107, goal < 70  Current lipid lowering medication: Lipitor 80 mg daily  Continue statin at discharge   Other Stroke Risk Factors  Advanced age  ETOH use, advised to drink no more than 1 alcoholic beverage per day.   Other Active Problems  Hypokalemia - 3.1 - supplemented  Opacification of the left mastoid air cells and partial opacification of the right mastoid air cells.   PLAN Consider TEE / loop tomorrow and dc home after that  Hospital day # 1   Recommend aspirin and Plavix for 3 weeks followed by aspirin alone and aggressive risk factor modification.  Discussed with patient`s daughter over the phone and answered questions..  Greater than 50% time during this 25-minute visit was spent on counseling and coordination of care about her embolic strokes and discussion about evaluation and treatment plan and answering questions.  Connie Contras, MD Medical Director [facility] Pager: [phone] 06/15/2019 12:31 PM   To contact  Stroke Continuity provider, please refer to [email or web address] After hours, contact General Neurology

## 2019-06-15 NOTE — H&P (View-Only) (Signed)
STROKE TEAM PROGRESS NOTE     SUBJECTIVE (INTERVAL HISTORY) Patient is having her echo done right now.  She has no complaints.  She feels she is made full recovery  Transthoracic echo and carotid Dopplers are both unremarkable.  OBJECTIVE Vitals:   06/14/19 1647 06/14/19 2047 06/15/19 0400 06/15/19 0700  BP: 122/67 125/65 (!) 123/57 124/87  Pulse: 67 70 60 79  Resp: 19 18 18 18   Temp: 98.5 F (36.9 C) 97.8 F (36.6 C) (!) 97.5 F (36.4 C)   TempSrc: Oral Oral Oral   SpO2: 98% 96% 97% 95%  Weight:      Height:        CBC:  Recent Labs  Lab 06/13/19 2150  WBC 5.3  HGB 13.0  HCT 41.1  MCV 100.7*  PLT 314    Basic Metabolic Panel:  Recent Labs  Lab 06/13/19 2150 06/14/19 0300  NA 142 140  K 3.1* 3.1*  CL 104 107  CO2 25 24  GLUCOSE 90 108*  BUN 15 14  CREATININE 0.85 0.75  CALCIUM 9.5 9.2  MG  --  2.1    Lipid Panel:     Component Value Date/Time   CHOL 184 06/14/2019 0300   CHOL 176 02/17/2019 1640   TRIG 67 06/14/2019 0300   HDL 64 06/14/2019 0300   HDL 62 02/17/2019 1640   CHOLHDL 2.9 06/14/2019 0300   VLDL 13 06/14/2019 0300   LDLCALC 107 (H) 06/14/2019 0300   LDLCALC 100 (H) 02/17/2019 1640   HgbA1c:  Lab Results  Component Value Date   HGBA1C 5.4 06/14/2019   Urine Drug Screen: No results found for: LABOPIA, COCAINSCRNUR, LABBENZ, AMPHETMU, THCU, LABBARB  Alcohol Level No results found for: [facility]  IMAGING  Mr Brain Wo Contrast 06/14/2019 IMPRESSION:  1. Small group of punctate acute/early subacute infarctions within the left occipital lobe. No hemorrhage or mass effect.  2. Mild-to-moderate chronic microvascular ischemic changes and volume loss of the brain.  3. Opacification of the left mastoid air cells and partial opacification of the right mastoid air cells.    Transthoracic Echocardiogram  06/14/2019 IMPRESSIONS  1. The left ventricle has normal systolic function, with an ejection fraction of 55-60%. The cavity size was normal.  Left ventricular diastolic Doppler parameters are consistent with impaired relaxation. Indeterminate filling pressures The E/e' is 8-15.  2. The right ventricle has normal systolic function. The cavity was normal. There is no increase in right ventricular wall thickness.  3. The mitral valve is grossly normal.  4. The tricuspid valve is grossly normal.  5. The aortic valve is grossly normal. No stenosis of the aortic valve.   Bilateral Carotid Dopplers  No significant bilateral extracranial carotid stenosis.  Transcranial Dopplers - pending   EKG - SR rate 71 BPM. (See cardiology reading for complete details)    PHYSICAL EXAM Blood pressure 124/87, pulse 79, temperature (!) 97.5 F (36.4 C), temperature source Oral, resp. rate 18, height 5\' 6"  (1.676 m), weight 61.1 kg, SpO2 95 %. Pleasant frail middle-aged Caucasian lady not in distress. . Afebrile. Head is nontraumatic. Neck is supple without bruit.    Cardiac exam no murmur or gallop. Lungs are clear to auscultation. Distal pulses are well felt. Neurological Exam ;  Awake  Alert oriented x 3. Normal speech and language.eye movements full without nystagmus.fundi were not visualized. Vision acuity   appears normal.  Right partial superior quadrantanopsia nw improved to bedside confrontation testing.  Hearing is normal. Palatal movements  are normal. Face symmetric. Tongue midline. Normal strength, tone, reflexes and coordination. Normal sensation. Gait deferred.     ASSESSMENT/PLAN [name] is a 68 y.o. female with no significant past medical hx other than previous breast cancer presenting with transient visual changes in the setting of recently diagnosed hypertension. She did not receive IV t-PA due to late presentation (>4.5 hours from time of onset).   Strokes:  punctate acute/early subacute infarctions within the left occipital lobe - embolic - source unknown  Resultant  Partial right superior qudrantanopsia  resolved  CT head - not performed  MRI head - Small group of punctate acute/early subacute infarctions within the left occipital lobe.  MRA head - not performed  CTA H&N - not performed  Trans-Cranial Dopplers - pending  Carotid Doppler - pending  2D Echo  - EF 55-60% . No cardiac source of emboli identified.   [employer] Virus 2 - not detected  LDL - 107  HgbA1c - 5.4  UDS - not performed  VTE prophylaxis - Lovenox  Diet -  - Heart healthy with thin liquids.  No antithrombotic prior to admission, now on aspirin 325 mg daily  Patient counseled to be compliant with her antithrombotic medications  Ongoing aggressive stroke risk factor management  Therapy recommendations:  No F/U recommended  Disposition:  Pending  Hypertension  Stable . Permissive hypertension (OK if < 220/120) but gradually normalize in 5-7 days . Long-term BP goal normotensive  Hyperlipidemia  Lipid lowering medication PTA:  none  LDL 107, goal < 70  Current lipid lowering medication: Lipitor 80 mg daily  Continue statin at discharge   Other Stroke Risk Factors  Advanced age  ETOH use, advised to drink no more than 1 alcoholic beverage per day.   Other Active Problems  Hypokalemia - 3.1 - supplemented  Opacification of the left mastoid air cells and partial opacification of the right mastoid air cells.   PLAN Consider TEE / loop tomorrow and dc home after that  Hospital day # 1   Recommend aspirin and Plavix for 3 weeks followed by aspirin alone and aggressive risk factor modification.  Discussed with patient`s daughter over the phone and answered questions..  Greater than 50% time during this 25-minute visit was spent on counseling and coordination of care about her embolic strokes and discussion about evaluation and treatment plan and answering questions.  Antony Contras, MD Medical Director [facility] Pager: [phone] 06/15/2019 12:31 PM   To contact  Stroke Continuity provider, please refer to [email or web address] After hours, contact General Neurology

## 2019-06-15 NOTE — Progress Notes (Signed)
Progress Note    Connie Meyer  TWS:[id number] DOB: [birth year]  DOA: 06/13/2019 PCP: [name], MD    Brief Narrative:   Medical records reviewed and are as summarized below:  Connie Meyer is an 68 y.o. female with medical history significant of hypertension, breast cancer presenting to the hospital via EMS for evaluation of blurry vision which had resolved prior to ED arrival.  Patient states about a week ago she experienced blurry vision in her left eye which lasted about 15 minutes.  She describes it as a gray curtain falling over the eye.  Then yesterday evening she was with her family and experienced the same thing again but in her right eye this time.  States again symptoms lasted about 15 minutes.   Assessment/Plan:   Principal Problem:   Acute CVA (cerebrovascular accident) ([place]) Active Problems:   HTN (hypertension)   Hypokalemia  Acute CVA -per neuro: ASA/plavix x 3 weeks then ASA alone -consideration for TEE/loop-- consulted cards via email -NPO after midnight -LDL: 107 -HgbA1c: 5.4 -echo and carotids unremarkable -continue tele -PT no follow up  hypokalemia -replete  Family Communication/Anticipated D/C date and plan/Code Status   DVT prophylaxis: Lovenox ordered. Code Status: Full Code.  Family Communication: Disposition Plan: home after loop?   Medical Consultants:    None.   Anti-Infectives:    None  Subjective:   No overnight events  Objective:    Vitals:   06/14/19 1647 06/14/19 2047 06/15/19 0400 06/15/19 0700  BP: 122/67 125/65 (!) 123/57 124/87  Pulse: 67 70 60 79  Resp: 19 18 18 18   Temp: 98.5 F (36.9 C) 97.8 F (36.6 C) (!) 97.5 F (36.4 C)   TempSrc: Oral Oral Oral   SpO2: 98% 96% 97% 95%  Weight:      Height:       No intake or output data in the 24 hours ending 06/15/19 0856 Filed Weights   06/13/19 2113 06/14/19 0404  Weight: 61.7 kg 61.1 kg    Exam: In bed, NAD A+Ox3 No increased  work of breathing No LE edema  Data Reviewed:   I have personally reviewed following labs and imaging studies:  Labs: Labs show the following:   Basic Metabolic Panel: Recent Labs  Lab 06/13/19 2150 06/14/19 0300  NA 142 140  K 3.1* 3.1*  CL 104 107  CO2 25 24  GLUCOSE 90 108*  BUN 15 14  CREATININE 0.85 0.75  CALCIUM 9.5 9.2  MG  --  2.1   GFR Estimated Creatinine Clearance: 63 mL/min (by C-G formula based on SCr of 0.75 mg/dL). Liver Function Tests: No results for input(s): AST, ALT, ALKPHOS, BILITOT, PROT, ALBUMIN in the last 168 hours. No results for input(s): LIPASE, AMYLASE in the last 168 hours. No results for input(s): AMMONIA in the last 168 hours. Coagulation profile No results for input(s): INR, PROTIME in the last 168 hours.  CBC: Recent Labs  Lab 06/13/19 2150  WBC 5.3  HGB 13.0  HCT 41.1  MCV 100.7*  PLT 191   Cardiac Enzymes: Recent Labs  Lab 06/14/19 0947  CKTOTAL 122   BNP (last 3 results) No results for input(s): PROBNP in the last 8760 hours. CBG: No results for input(s): GLUCAP in the last 168 hours. D-Dimer: No results for input(s): DDIMER in the last 72 hours. Hgb A1c: Recent Labs    06/14/19 0300  HGBA1C 5.4   Lipid Profile: Recent Labs    06/14/19  0300  CHOL 184  HDL 64  LDLCALC 107*  TRIG 67  CHOLHDL 2.9   Thyroid function studies: No results for input(s): TSH, T4TOTAL, T3FREE, THYROIDAB in the last 72 hours.  Invalid input(s): FREET3 Anemia work up: No results for input(s): VITAMINB12, FOLATE, FERRITIN, TIBC, IRON, RETICCTPCT in the last 72 hours. Sepsis Labs: Recent Labs  Lab 06/13/19 2150  WBC 5.3    Microbiology Recent Results (from the past 240 hour(s))  SARS Coronavirus 2     Status: None   Collection Time: 06/14/19 12:49 AM  Result Value Ref Range Status   SARS Coronavirus 2 NOT DETECTED NOT DETECTED Final    Comment: (NOTE) SARS-CoV-2 target nucleic acids are NOT DETECTED. The SARS-CoV-2 RNA  is generally detectable in upper and lower respiratory specimens during the acute phase of infection.  Negative  results do not preclude SARS-CoV-2 infection, do not rule out co-infections with other pathogens, and should not be used as the sole basis for treatment or other patient management decisions.  Negative results must be combined with clinical observations, patient history, and epidemiological information. The expected result is Not Detected. Fact Sheet for Patients: http://www.biofiredefense.com/wp-content/uploads/2020/03/BIOFIRE-COVID -19-patients.pdf Fact Sheet for Healthcare Providers: http://www.biofiredefense.com/wp-content/uploads/2020/03/BIOFIRE-COVID -19-hcp.pdf This test is not yet approved or cleared by the [place] and  has been authorized for detection and/or diagnosis of SARS-CoV-2 by FDA under an Emergency Use Authorization (EUA).  This EUA will remain in effec t (meaning this test can be used) for the duration of  the COVID-19 declaration under Section 564(b)(1) of the Act, 21 U.S.C. section 360bbb-3(b)(1), unless the authorization is terminated or revoked sooner. Performed at [facility], [place] [address], [place], [place] [address]     Procedures and diagnostic studies:  Mr Brain Wo Contrast  Result Date: 06/14/2019 CLINICAL DATA:  68 y/o F; transient episode with loss of half of vision. TIA, initial exam. EXAM: MRI HEAD WITHOUT CONTRAST TECHNIQUE: Multiplanar, multiecho pulse sequences of the brain and surrounding structures were obtained without intravenous contrast. COMPARISON:  12/13/2003 CT head. FINDINGS: Brain: Small cluster of punctate foci of reduced diffusion within the left occipital lobe compatible with acute/early subacute infarction. No associated hemorrhage or mass effect. Punctate and early confluent nonspecific T2 FLAIR hyperintensities in subcortical and periventricular white matter are compatible with mild to moderate chronic  microvascular ischemic changes. Mild volume loss of the brain. No extra-axial collection, hydrocephalus, mass effect, or herniation. Vascular: Normal flow voids. Skull and upper cervical spine: Normal marrow signal. Sinuses/Orbits: Opacification of the left mastoid air cells and partial opacification of the right mastoid air cells. No abnormal signal of the included paranasal sinuses. Bilateral intra-ocular lens replacement. Other: None. IMPRESSION: 1. Small group of punctate acute/early subacute infarctions within the left occipital lobe. No hemorrhage or mass effect. 2. Mild-to-moderate chronic microvascular ischemic changes and volume loss of the brain. 3. Opacification of the left mastoid air cells and partial opacification of the right mastoid air cells. These results were called by telephone at the time of interpretation on 06/14/2019 at 12:30 am to Dr. Tyrone Nine, who verbally acknowledged these results. Electronically Signed   By: Kristine Garbe M.D.   On: 06/14/2019 00:31   Vas US Carotid (at [place] Only)  Result Date: 06/14/2019 Carotid Arterial Duplex Study Indications:       CVA and Visual disturbance. Risk Factors:      Hypertension. Limitations:       Sunlight/glare Comparison Study:  No prior study on file  for comparison Performing Technologist: Sharion Dove RVS  Examination Guidelines: A complete evaluation includes B-mode imaging, spectral Doppler, color Doppler, and power Doppler as needed of all accessible portions of each vessel. Bilateral testing is considered an integral part of a complete examination. Limited examinations for reoccurring indications may be performed as noted.  Right Carotid Findings: +----------+--------+--------+--------+------------+------------------+             PSV cm/s EDV cm/s Stenosis Describe     Comments            +----------+--------+--------+--------+------------+------------------+  CCA Prox   169      28                heterogenous                      +----------+--------+--------+--------+------------+------------------+  CCA Distal 151      29                             intimal thickening  +----------+--------+--------+--------+------------+------------------+  ICA Prox   135      26                heterogenous                     +----------+--------+--------+--------+------------+------------------+  ICA Distal 98       26                                                 +----------+--------+--------+--------+------------+------------------+  ECA        88       20                                                 +----------+--------+--------+--------+------------+------------------+ +----------+--------+-------+--------+-------------------+             PSV cm/s EDV cms Describe Arm Pressure (mmHG)  +----------+--------+-------+--------+-------------------+  Subclavian 115                                            +----------+--------+-------+--------+-------------------+ +---------+--------+---+--------+--+  Vertebral PSV cm/s 129 EDV cm/s 37  +---------+--------+---+--------+--+  Left Carotid Findings: +----------+--------+--------+--------+------------+------------------+             PSV cm/s EDV cm/s Stenosis Describe     Comments            +----------+--------+--------+--------+------------+------------------+  CCA Prox   144      33                             intimal thickening  +----------+--------+--------+--------+------------+------------------+  CCA Distal 117      31                             intimal thickening  +----------+--------+--------+--------+------------+------------------+  ICA Prox   90       26                heterogenous                     +----------+--------+--------+--------+------------+------------------+  ICA Distal 109      33                                                 +----------+--------+--------+--------+------------+------------------+  ECA        79       20                                                  +----------+--------+--------+--------+------------+------------------+ +----------+--------+--------+--------+-------------------+  Subclavian PSV cm/s EDV cm/s Describe Arm Pressure (mmHG)  +----------+--------+--------+--------+-------------------+             185                                             +----------+--------+--------+--------+-------------------+ +---------+--------+---+--------+--+  Vertebral PSV cm/s 106 EDV cm/s 27  +---------+--------+---+--------+--+  Summary: Right Carotid: Velocities in the right ICA are consistent with a 1-39% stenosis. Left Carotid: Velocities in the left ICA are consistent with a 1-39% stenosis. Vertebrals:  Bilateral vertebral arteries demonstrate antegrade flow. Subclavians: Normal flow hemodynamics were seen in bilateral subclavian              arteries. *See table(s) above for measurements and observations.     Preliminary     Medications:    aspirin  325 mg Oral Daily   atorvastatin  40 mg Oral q1800   enoxaparin (LOVENOX) injection  40 mg Subcutaneous Daily   Continuous Infusions:   LOS: 1 day   Geradine Girt  [facility]   How to contact the [facility] Attending or Consulting provider [place] or covering provider during after hours [place], for this patient?  1. Check the care team in [facility] and look for a) attending/consulting [facility] provider listed and b) the [facility] team listed 2. Log into [email or web address] and use [place]'s universal password to access. If you do not have the password, please contact the hospital operator. 3. Locate the [facility] provider you are looking for under [facility] and page to a number that you can be directly reached. 4. If you still have difficulty reaching the provider, please page the [facility] (Director on Call) for the Hospitalists listed on amion for assistance.  06/15/2019, 8:56 AM

## 2019-06-16 ENCOUNTER — Encounter (HOSPITAL_COMMUNITY)
Admission: EM | Disposition: A | Discharge status: Home / Self Care | Payer: Self-pay | Source: Home / Self Care | Attending: Internal Medicine

## 2019-06-16 ENCOUNTER — Inpatient Hospital Stay (HOSPITAL_COMMUNITY): Payer: Medicare Other

## 2019-06-16 ENCOUNTER — Encounter (HOSPITAL_COMMUNITY): Payer: Self-pay

## 2019-06-16 DIAGNOSIS — I6389 Other cerebral infarction: Secondary | ICD-10-CM

## 2019-06-16 HISTORY — PX: BUBBLE STUDY: SHX6837

## 2019-06-16 HISTORY — PX: LOOP RECORDER INSERTION: EP1214

## 2019-06-16 HISTORY — PX: TEE WITHOUT CARDIOVERSION: SHX5443

## 2019-06-16 LAB — BASIC METABOLIC PANEL
Anion gap: 8 (ref 5–15)
BUN: 18 mg/dL (ref 8–23)
CO2: 22 mmol/L (ref 22–32)
Calcium: 9.1 mg/dL (ref 8.9–10.3)
Chloride: 107 mmol/L (ref 98–111)
Creatinine, Ser: 0.82 mg/dL (ref 0.44–1.00)
GFR calc Af Amer: >60 mL/min (ref >60)
GFR calc non Af Amer: >60 mL/min (ref >60)
Glucose, Bld: 105 mg/dL — ABNORMAL HIGH (ref 70–99)
Potassium: 3.9 mmol/L (ref 3.5–5.1)
Sodium: 137 mmol/L (ref 135–145)

## 2019-06-16 LAB — CBC
HCT: 35.7 % — ABNORMAL LOW (ref 36.0–46.0)
Hemoglobin: 11.8 g/dL — ABNORMAL LOW (ref 12.0–15.0)
MCH: 32.4 pg (ref 26.0–34.0)
MCHC: 33.1 g/dL (ref 30.0–36.0)
MCV: 98.1 fL (ref 80.0–100.0)
Platelets: 166 10*3/uL (ref 150–400)
RBC: 3.64 MIL/uL — ABNORMAL LOW (ref 3.87–5.11)
RDW: 12.1 % (ref 11.5–15.5)
WBC: 6.4 10*3/uL (ref 4.0–10.5)
nRBC: 0 % (ref 0.0–0.2)

## 2019-06-16 SURGERY — ECHOCARDIOGRAM, TRANSESOPHAGEAL
Anesthesia: Moderate Sedation

## 2019-06-16 SURGERY — LOOP RECORDER INSERTION

## 2019-06-16 MED ORDER — MIDAZOLAM HCL (PF) 5 MG/ML IJ SOLN
INTRAMUSCULAR | Status: AC
  Filled 2019-06-16: qty 2

## 2019-06-16 MED ORDER — LIDOCAINE VISCOUS HCL 2 % MT SOLN
OROMUCOSAL | Status: DC | PRN
  Administered 2019-06-16: 20 mL via OROMUCOSAL

## 2019-06-16 MED ORDER — BUPIVACAINE HCL (PF) 0.25 % IJ SOLN: INTRAMUSCULAR | Status: DC | PRN

## 2019-06-16 MED ORDER — CLOPIDOGREL BISULFATE 75 MG PO TABS: 75.0000 mg | ORAL_TABLET | Freq: Every day | ORAL | 0 refills | Status: DC

## 2019-06-16 MED ORDER — SODIUM CHLORIDE 0.9 % IV SOLN
INTRAVENOUS | Status: DC
  Administered 2019-06-16: 15:00:00 via INTRAVENOUS

## 2019-06-16 MED ORDER — LIDOCAINE-EPINEPHRINE 1 %-1:100000 IJ SOLN
INTRAMUSCULAR | Status: DC | PRN
  Administered 2019-06-16: 10 mL

## 2019-06-16 MED ORDER — FENTANYL CITRATE (PF) 100 MCG/2ML IJ SOLN
INTRAMUSCULAR | Status: AC
  Filled 2019-06-16: qty 4

## 2019-06-16 MED ORDER — LIDOCAINE-EPINEPHRINE 1 %-1:100000 IJ SOLN
INTRAMUSCULAR | Status: AC
  Filled 2019-06-16: qty 1

## 2019-06-16 MED ORDER — DIPHENHYDRAMINE HCL 50 MG/ML IJ SOLN
INTRAMUSCULAR | Status: AC
  Filled 2019-06-16: qty 1

## 2019-06-16 MED ORDER — LIDOCAINE VISCOUS HCL 2 % MT SOLN
OROMUCOSAL | Status: AC
  Filled 2019-06-16: qty 15

## 2019-06-16 MED ORDER — ASPIRIN 81 MG PO TBEC: 81.0000 mg | DELAYED_RELEASE_TABLET | Freq: Every day | ORAL | 0 refills | Status: DC

## 2019-06-16 MED ORDER — ATORVASTATIN CALCIUM 40 MG PO TABS: 40.0000 mg | ORAL_TABLET | Freq: Every day | ORAL | 1 refills | Status: DC

## 2019-06-16 MED ORDER — FENTANYL CITRATE (PF) 100 MCG/2ML IJ SOLN
INTRAMUSCULAR | Status: DC | PRN
  Administered 2019-06-16 (×2): 25 ug via INTRAVENOUS

## 2019-06-16 MED ORDER — SODIUM CHLORIDE 0.9 % IV SOLN
INTRAVENOUS | Status: AC | PRN
  Administered 2019-06-16: 500 mL via INTRAMUSCULAR

## 2019-06-16 MED ORDER — MIDAZOLAM HCL (PF) 10 MG/2ML IJ SOLN
INTRAMUSCULAR | Status: DC | PRN
  Administered 2019-06-16: 1 mg via INTRAVENOUS
  Administered 2019-06-16 (×2): 2 mg via INTRAVENOUS

## 2019-06-16 SURGICAL SUPPLY — 2 items
LOOP REVEAL LINQSYS (Prosthesis & Implant Heart) ×1 IMPLANT
PACK LOOP INSERTION (CUSTOM PROCEDURE TRAY) ×2 IMPLANT

## 2019-06-16 NOTE — Consult Note (Addendum)
ELECTROPHYSIOLOGY CONSULT NOTE  Patient ID: Connie Meyer MRN: [id number], DOB/AGE: [birth year]   Admit date: 06/13/2019 Date of Consult: 06/16/2019  Primary Physician: [name], MD Primary Cardiologist: No primary care provider on file.  Primary Electrophysiologist: New to None  Reason for Consultation: Cryptogenic stroke; recommendations regarding Implantable Loop Recorder  History of Present Illness EP has been asked to evaluate Connie Meyer for placement of an implantable loop recorder to monitor for atrial fibrillation by Dr Leonie Man.  The patient was admitted on 06/13/2019 with R visual  changes.  Imaging demonstrated early/subacute punctate changes in the L occipital lobe.  she has undergone workup for stroke including echocardiogram and carotid dopplers which were unremarkable. The patient has been monitored on telemetry which has demonstrated sinus rhythm with no arrhythmias.  Inpatient stroke work-up is to be completed with a TEE.   Echocardiogram this admission demonstrated normal EF and LA size.  Lab work is reviewed.  Pt does states she had several weeks of palpitations leading up to this admission. She has not had any in the hospital. She has a distant history of esophageal dilatation. No prior cardiac history. Prior to admission, the patient denies chest pain, shortness of breath, dizziness, or syncope.  They are recovering from their stroke with plans to go home at discharge.  Past Medical History:  Diagnosis Date   [facility])    bilateral breast     Surgical History:  Past Surgical History:  Procedure Laterality Date   BREAST SURGERY       Medications Prior to Admission  Medication Sig Dispense Refill Last Dose   amLODipine (NORVASC) 5 MG tablet Take 1 tablet (5 mg total) by mouth daily. 90 tablet 3 06/13/2019 at Unknown time   meloxicam (MOBIC) 15 MG tablet Take 1 tablet (15 mg total) by mouth daily. 90 tablet 0 06/13/2019 at Unknown time    hydrOXYzine (ATARAX/VISTARIL) 25 MG tablet Take 0.5-1 tablets (12.5-25 mg total) by mouth at bedtime as needed. (Patient not taking: Reported on 06/06/2019) 30 tablet 2 Not Taking at Unknown time    Inpatient Medications:   aspirin EC  81 mg Oral Daily   atorvastatin  40 mg Oral q1800   clopidogrel  75 mg Oral Daily   enoxaparin (LOVENOX) injection  40 mg Subcutaneous Daily    Allergies: No Known Allergies  Social History   Socioeconomic History   Marital status: Divorced    Spouse name: Not on file   Number of children: 4   Years of education: Not on file   Highest education level: Not on file  Occupational History   Not on file  Social Needs   Financial resource strain: Not on file   Food insecurity    Worry: Not on file    Inability: Not on file   Transportation needs    Medical: Not on file    Non-medical: Not on file  Tobacco Use   Smoking status: Never Smoker   Smokeless tobacco: Never Used  Substance and Sexual Activity   Alcohol use: Yes    Alcohol/week: 0.0 standard drinks   Drug use: No   Sexual activity: Never  Lifestyle   Physical activity    Days per week: Not on file    Minutes per session: Not on file   Stress: Not on file  Relationships   Social connections    Talks on phone: Not on file    Gets together: Not on file  Attends religious service: Not on file    Active member of club or organization: Not on file    Attends meetings of clubs or organizations: Not on file    Relationship status: Not on file   Intimate partner violence    Fear of current or ex partner: Not on file    Emotionally abused: Not on file    Physically abused: Not on file    Forced sexual activity: Not on file  Other Topics Concern   Not on file  Social History Narrative   Not on file     History reviewed. No pertinent family history.    Review of Systems: All other systems reviewed and are otherwise negative except as noted  above.  Physical Exam: Vitals:   06/15/19 1700 06/15/19 2036 06/16/19 0021 06/16/19 0401  BP: 127/73 121/69 125/67 137/64  Pulse:  70 87 83  Resp: 18 17 17 16   Temp: 98 F (36.7 C) 98.2 F (36.8 C) 99.7 F (37.6 C) 99 F (37.2 C)  TempSrc: Other (Comment) Oral Oral Oral  SpO2: 100% 99% 99% 98%  Weight:      Height:        GEN- The patient is well appearing, alert and oriented x 3 today.   Head- normocephalic, atraumatic Eyes-  Sclera clear, conjunctiva pink Ears- hearing intact Oropharynx- clear Neck- supple Lungs- Normal work of breathing Heart- Regular rate and rhythm GI- soft, NT, ND, + BS Extremities- no clubbing, cyanosis, or edema MS- no significant deformity or atrophy Skin- no rash or lesion Psych- euthymic mood, full affect  Labs:   Lab Results  Component Value Date   WBC 6.4 06/16/2019   HGB 11.8 (L) 06/16/2019   HCT 35.7 (L) 06/16/2019   MCV 98.1 06/16/2019   PLT 166 06/16/2019    Recent Labs  Lab 06/16/19 0636  NA 137  K 3.9  CL 107  CO2 22  BUN 18  CREATININE 0.82  CALCIUM 9.1  GLUCOSE 105*     Radiology/Studies: Mr Brain Wo Contrast  Result Date: 06/14/2019 CLINICAL DATA:  68 y/o F; transient episode with loss of half of vision. TIA, initial exam. EXAM: MRI HEAD WITHOUT CONTRAST TECHNIQUE: Multiplanar, multiecho pulse sequences of the brain and surrounding structures were obtained without intravenous contrast. COMPARISON:  12/13/2003 CT head. FINDINGS: Brain: Small cluster of punctate foci of reduced diffusion within the left occipital lobe compatible with acute/early subacute infarction. No associated hemorrhage or mass effect. Punctate and early confluent nonspecific T2 FLAIR hyperintensities in subcortical and periventricular white matter are compatible with mild to moderate chronic microvascular ischemic changes. Mild volume loss of the brain. No extra-axial collection, hydrocephalus, mass effect, or herniation. Vascular: Normal flow voids.  Skull and upper cervical spine: Normal marrow signal. Sinuses/Orbits: Opacification of the left mastoid air cells and partial opacification of the right mastoid air cells. No abnormal signal of the included paranasal sinuses. Bilateral intra-ocular lens replacement. Other: None. IMPRESSION: 1. Small group of punctate acute/early subacute infarctions within the left occipital lobe. No hemorrhage or mass effect. 2. Mild-to-moderate chronic microvascular ischemic changes and volume loss of the brain. 3. Opacification of the left mastoid air cells and partial opacification of the right mastoid air cells. These results were called by telephone at the time of interpretation on 06/14/2019 at 12:30 am to Dr. Tyrone Nine, who verbally acknowledged these results. Electronically Signed   By: Kristine Garbe M.D.   On: 06/14/2019 00:31   Vas US Carotid (at [facility])  Result Date: 06/15/2019 Carotid Arterial Duplex Study Indications:       CVA and Visual disturbance. Risk Factors:      Hypertension. Limitations:       Sunlight/glare Comparison Study:  No prior study on file for comparison Performing Technologist: Sharion Dove RVS  Examination Guidelines: A complete evaluation includes B-mode imaging, spectral Doppler, color Doppler, and power Doppler as needed of all accessible portions of each vessel. Bilateral testing is considered an integral part of a complete examination. Limited examinations for reoccurring indications may be performed as noted.  Right Carotid Findings: +----------+--------+--------+--------+------------+------------------+             PSV cm/s EDV cm/s Stenosis Describe     Comments            +----------+--------+--------+--------+------------+------------------+  CCA Prox   169      28                heterogenous                     +----------+--------+--------+--------+------------+------------------+  CCA Distal 151      29                             intimal thickening   +----------+--------+--------+--------+------------+------------------+  ICA Prox   135      26                heterogenous                     +----------+--------+--------+--------+------------+------------------+  ICA Distal 98       26                                                 +----------+--------+--------+--------+------------+------------------+  ECA        88       20                                                 +----------+--------+--------+--------+------------+------------------+ +----------+--------+-------+--------+-------------------+             PSV cm/s EDV cms Describe Arm Pressure (mmHG)  +----------+--------+-------+--------+-------------------+  Subclavian 115                                            +----------+--------+-------+--------+-------------------+ +---------+--------+---+--------+--+  Vertebral PSV cm/s 129 EDV cm/s 37  +---------+--------+---+--------+--+  Left Carotid Findings: +----------+--------+--------+--------+------------+------------------+             PSV cm/s EDV cm/s Stenosis Describe     Comments            +----------+--------+--------+--------+------------+------------------+  CCA Prox   144      33                             intimal thickening  +----------+--------+--------+--------+------------+------------------+  CCA Distal 117      31  intimal thickening  +----------+--------+--------+--------+------------+------------------+  ICA Prox   90       26                heterogenous                     +----------+--------+--------+--------+------------+------------------+  ICA Distal 109      33                                                 +----------+--------+--------+--------+------------+------------------+  ECA        79       20                                                 +----------+--------+--------+--------+------------+------------------+ +----------+--------+--------+--------+-------------------+  Subclavian PSV  cm/s EDV cm/s Describe Arm Pressure (mmHG)  +----------+--------+--------+--------+-------------------+             185                                             +----------+--------+--------+--------+-------------------+ +---------+--------+---+--------+--+  Vertebral PSV cm/s 106 EDV cm/s 27  +---------+--------+---+--------+--+  Summary: Right Carotid: Velocities in the right ICA are consistent with a 1-39% stenosis. Left Carotid: Velocities in the left ICA are consistent with a 1-39% stenosis. Vertebrals:  Bilateral vertebral arteries demonstrate antegrade flow. Subclavians: Normal flow hemodynamics were seen in bilateral subclavian              arteries. *See table(s) above for measurements and observations.  Electronically signed by Antony Contras MD on 06/15/2019 at 2:14:36 PM.    Final     12-lead ECG NSR (personally reviewed) All prior EKG's in EPIC reviewed with no documented atrial fibrillation  Telemetry NSR (personally reviewed)  Assessment and Plan:  1. Cryptogenic stroke The patient presents with cryptogenic stroke.  The patient has a TEE planned for this PM.  I spoke at length with the patient about monitoring for afib with an implantable loop recorder.  Risks, benefits, and alteratives to implantable loop recorder were discussed with the patient today.   At this time, the patient is very clear in their decision to proceed with implantable loop recorder.   Wound care was reviewed with the patient (keep incision clean and dry for 3 days).  Wound check scheduled and entered in AVS.  Please call with questions.  Shirley Friar, PA-C 06/16/2019 8:58 AM   EP Attending  Patient seen and examined. Agree with the findings as noted above. The TEE does not explain the etiology of her stroke. I have reviewed the indications/risks/benefits/goalse/expectations of ILR insertion for cryptogenic stroke and she wishes to proceed.  Mikle Bosworth.D.

## 2019-06-16 NOTE — Interval H&P Note (Signed)
History and Physical Interval Note:  06/16/2019 9:29 AM  Connie Meyer  has presented today for surgery, with the diagnosis of stroke, loop.  The various methods of treatment have been discussed with the patient and family. After consideration of risks, benefits and other options for treatment, the patient has consented to  Procedure(s): TRANSESOPHAGEAL ECHOCARDIOGRAM (TEE) (N/A) as a surgical intervention.  The patient's history has been reviewed, patient examined, no change in status, stable for surgery.  I have reviewed the patient's chart and labs.  Questions were answered to the patient's satisfaction.     [name]

## 2019-06-16 NOTE — Progress Notes (Signed)
STROKE TEAM PROGRESS NOTE     SUBJECTIVE (INTERVAL HISTORY) Patient is having TEE and loop recorder later today.ow.  She has no complaints.    OBJECTIVE Vitals:   06/16/19 1514 06/16/19 1519 06/16/19 1528 06/16/19 1538  BP: 131/68 128/64 120/65 114/65  Pulse: 91 76 74 68  Resp: 20 20 20 18   Temp:  97.9 F (36.6 C)    TempSrc:  Oral    SpO2: 98% 94% 93% 94%  Weight:      Height:        CBC:  Recent Labs  Lab 06/13/19 2150 06/16/19 0636  WBC 5.3 6.4  HGB 13.0 11.8*  HCT 41.1 35.7*  MCV 100.7* 98.1  PLT 191 295    Basic Metabolic Panel:  Recent Labs  Lab 06/14/19 0300 06/16/19 0636  NA 140 137  K 3.1* 3.9  CL 107 107  CO2 24 22  GLUCOSE 108* 105*  BUN 14 18  CREATININE 0.75 0.82  CALCIUM 9.2 9.1  MG 2.1  --     Lipid Panel:     Component Value Date/Time   CHOL 184 06/14/2019 0300   CHOL 176 02/17/2019 1640   TRIG 67 06/14/2019 0300   HDL 64 06/14/2019 0300   HDL 62 02/17/2019 1640   CHOLHDL 2.9 06/14/2019 0300   VLDL 13 06/14/2019 0300   LDLCALC 107 (H) 06/14/2019 0300   LDLCALC 100 (H) 02/17/2019 1640   HgbA1c:  Lab Results  Component Value Date   HGBA1C 5.4 06/14/2019   Urine Drug Screen: No results found for: LABOPIA, COCAINSCRNUR, LABBENZ, AMPHETMU, THCU, LABBARB  Alcohol Level No results found for: [facility]  IMAGING  Mr Brain Wo Contrast 06/14/2019 IMPRESSION:  1. Small group of punctate acute/early subacute infarctions within the left occipital lobe. No hemorrhage or mass effect.  2. Mild-to-moderate chronic microvascular ischemic changes and volume loss of the brain.  3. Opacification of the left mastoid air cells and partial opacification of the right mastoid air cells.    Transthoracic Echocardiogram  06/14/2019 IMPRESSIONS  1. The left ventricle has normal systolic function, with an ejection fraction of 55-60%. The cavity size was normal. Left ventricular diastolic Doppler parameters are consistent with impaired relaxation.  Indeterminate filling pressures The E/e' is 8-15.  2. The right ventricle has normal systolic function. The cavity was normal. There is no increase in right ventricular wall thickness.  3. The mitral valve is grossly normal.  4. The tricuspid valve is grossly normal.  5. The aortic valve is grossly normal. No stenosis of the aortic valve.   Bilateral Carotid Dopplers  No significant bilateral extracranial carotid stenosis.  Transcranial Dopplers - pending   EKG - SR rate 71 BPM. (See cardiology reading for complete details)    PHYSICAL EXAM Blood pressure 114/65, pulse 68, temperature 97.9 F (36.6 C), temperature source Oral, resp. rate 18, height 5\' 6"  (1.676 m), weight 61.1 kg, SpO2 94 %. Pleasant frail middle-aged Caucasian lady not in distress. . Afebrile. Head is nontraumatic. Neck is supple without bruit.    Cardiac exam no murmur or gallop. Lungs are clear to auscultation. Distal pulses are well felt. Neurological Exam ;  Awake  Alert oriented x 3. Normal speech and language.eye movements full without nystagmus.fundi were not visualized. Vision acuity   appears normal.  Right partial superior quadrantanopsia nw improved to bedside confrontation testing.  Hearing is normal. Palatal movements are normal. Face symmetric. Tongue midline. Normal strength, tone, reflexes and coordination. Normal sensation. Gait deferred.  ASSESSMENT/PLAN Connie Meyer is a 68 y.o. female with no significant past medical hx other than previous breast cancer presenting with transient visual changes in the setting of recently diagnosed hypertension. She did not receive IV t-PA due to late presentation (>4.5 hours from time of onset).   Strokes:  punctate acute/early subacute infarctions within the left occipital lobe - embolic - source unknown  Resultant  Partial right superior qudrantanopsia resolved  CT head - not performed  MRI head - Small group of punctate acute/early subacute  infarctions within the left occipital lobe.  MRA head - not performed  CTA H&N - not performed  Trans-Cranial Dopplers - pending  Carotid Doppler -bilateral 1-39% carotid stenosis   2D Echo  - EF 55-60% . No cardiac source of emboli identified.   [employer] Virus 2 - not detected  LDL - 107  HgbA1c - 5.4  UDS - not performed  VTE prophylaxis - Lovenox  Diet -  - Heart healthy with thin liquids.  No antithrombotic prior to admission, now on aspirin 325 mg daily  Patient counseled to be compliant with her antithrombotic medications  Ongoing aggressive stroke risk factor management  Therapy recommendations:  No F/U recommended  Disposition:  Pending  Hypertension  Stable . Permissive hypertension (OK if < 220/120) but gradually normalize in 5-7 days . Long-term BP goal normotensive  Hyperlipidemia  Lipid lowering medication PTA:  none  LDL 107, goal < 70  Current lipid lowering medication: Lipitor 80 mg daily  Continue statin at discharge   Other Stroke Risk Factors  Advanced age  ETOH use, advised to drink no more than 1 alcoholic beverage per day.   Other Active Problems  Hypokalemia - 3.1 - supplemented  Opacification of the left mastoid air cells and partial opacification of the right mastoid air cells.   PLAN TEE / loop today and dc home after that  Hospital day # 2   Recommend aspirin and Plavix for 3 weeks followed by aspirin alone and aggressive risk factor modification.  Discussed with patient .consider participation in the [place] trial for cryptogenic stroke in the future as outpatient if interested.  Follow-up as an outpatient stroke clinic in 6 weeks and answered questions.Antony Contras, MD Medical Director [facility] Pager: [phone] 06/16/2019 5:05 PM   To contact Stroke Continuity provider, please refer to [email or web address] After hours, contact General Neurology

## 2019-06-16 NOTE — Discharge Summary (Signed)
Physician Discharge Summary  Connie Meyer:[id number] DOB: Jun 21, 1951 DOA: 06/13/2019  PCP: [name], MD  Admit date: 06/13/2019 Discharge date: 06/16/2019  Admitted From: home Discharge disposition: home   Recommendations for Outpatient Follow-Up:   ASA plus plavix x 3 weeks then ASA alone S/p TEE/loop Statin started BMP 1 week ? OSA test-- patient reports a sleep disorder  Discharge Diagnosis:   Principal Problem:   Acute CVA (cerebrovascular accident) ([place]) Active Problems:   HTN (hypertension)   Hypokalemia    Discharge Condition: Improved.  Diet recommendation: Low sodium, heart healthy  Wound care: None.  Code status: Full.   History of Present Illness:   Connie Meyer is a 68 y.o. female with medical history significant of hypertension, breast cancer presenting to the hospital via EMS for evaluation of blurry vision which had resolved prior to ED arrival.  Patient states about a week ago she experienced blurry vision in her left eye which lasted about 15 minutes.  She describes it as a gray curtain falling over the eye.  Then yesterday evening she was with her family and experienced the same thing again but in her right eye this time.  States again symptoms lasted about 15 minutes.  At present, her vision is normal.  She did not have any slurring of speech or facial droop at that time.  States her left hand was twitching at that time but she did not have any focal weakness or numbness.  Denies history of prior stroke.  Denies history of tobacco use.  She does not take aspirin.  She was diagnosed with hypertension a week ago and started on a medication.  Denies any fevers, chills, chest pain, shortness of breath, abdominal pain, nausea, vomiting, diarrhea, or dysuria.  No other complaints.   Hospital Course by Problem:   Strokes:  punctate acute/early subacute infarctions within the left occipital lobe - embolic - source  unknown  Resultant  Partial right superior qudrantanopsia resolved  MRI head - Small group of punctate acute/early subacute infarctions within the left occipital lobe.  Carotid Doppler -bilateral 1-39% carotid stenosis   2D Echo  - EF 55-60% . No cardiac source of emboli identified.   LDL - 107- statin started  HgbA1c - 5.4  S/p TEE/loop  aspirin and Plavix for 3 weeks followed by aspirin alone    Hyperlipidemia  LDL 107, goal < 70  Continue statin at discharge      Medical Consultants:    neuro  Discharge Exam:   Vitals:   06/16/19 1528 06/16/19 1538  BP: 120/65 114/65  Pulse: 74 68  Resp: 20 18  Temp:    SpO2: 93% 94%   Vitals:   06/16/19 1514 06/16/19 1519 06/16/19 1528 06/16/19 1538  BP: 131/68 128/64 120/65 114/65  Pulse: 91 76 74 68  Resp: 20 20 20 18   Temp:  97.9 F (36.6 C)    TempSrc:  Oral    SpO2: 98% 94% 93% 94%  Weight:      Height:        General exam: Appears calm and comfortable.   The results of significant diagnostics from this hospitalization (including imaging, microbiology, ancillary and laboratory) are listed below for reference.     Procedures and Diagnostic Studies:   Mr Brain 2 Contrast  Result Date: 06/14/2019 CLINICAL DATA:  68 y/o F; transient episode with loss of half of vision. TIA, initial exam. EXAM: MRI HEAD WITHOUT CONTRAST TECHNIQUE:  Multiplanar, multiecho pulse sequences of the brain and surrounding structures were obtained without intravenous contrast. COMPARISON:  12/13/2003 CT head. FINDINGS: Brain: Small cluster of punctate foci of reduced diffusion within the left occipital lobe compatible with acute/early subacute infarction. No associated hemorrhage or mass effect. Punctate and early confluent nonspecific T2 FLAIR hyperintensities in subcortical and periventricular white matter are compatible with mild to moderate chronic microvascular ischemic changes. Mild volume loss of the brain. No extra-axial  collection, hydrocephalus, mass effect, or herniation. Vascular: Normal flow voids. Skull and upper cervical spine: Normal marrow signal. Sinuses/Orbits: Opacification of the left mastoid air cells and partial opacification of the right mastoid air cells. No abnormal signal of the included paranasal sinuses. Bilateral intra-ocular lens replacement. Other: None. IMPRESSION: 1. Small group of punctate acute/early subacute infarctions within the left occipital lobe. No hemorrhage or mass effect. 2. Mild-to-moderate chronic microvascular ischemic changes and volume loss of the brain. 3. Opacification of the left mastoid air cells and partial opacification of the right mastoid air cells. These results were called by telephone at the time of interpretation on 06/14/2019 at 12:30 am to Dr. Tyrone Nine, who verbally acknowledged these results. Electronically Signed   By: Kristine Garbe M.D.   On: 06/14/2019 00:31   Vas US Carotid (at [facility] Only)  Result Date: 06/15/2019 Carotid Arterial Duplex Study Indications:       CVA and Visual disturbance. Risk Factors:      Hypertension. Limitations:       Sunlight/glare Comparison Study:  No prior study on file for comparison Performing Technologist: Sharion Dove RVS  Examination Guidelines: A complete evaluation includes B-mode imaging, spectral Doppler, color Doppler, and power Doppler as needed of all accessible portions of each vessel. Bilateral testing is considered an integral part of a complete examination. Limited examinations for reoccurring indications may be performed as noted.  Right Carotid Findings: +----------+--------+--------+--------+------------+------------------+             PSV cm/s EDV cm/s Stenosis Describe     Comments            +----------+--------+--------+--------+------------+------------------+  CCA Prox   169      28                heterogenous                     +----------+--------+--------+--------+------------+------------------+   CCA Distal 151      29                             intimal thickening  +----------+--------+--------+--------+------------+------------------+  ICA Prox   135      26                heterogenous                     +----------+--------+--------+--------+------------+------------------+  ICA Distal 98       26                                                 +----------+--------+--------+--------+------------+------------------+  ECA        88       20                                                 +----------+--------+--------+--------+------------+------------------+ +----------+--------+-------+--------+-------------------+  PSV cm/s EDV cms Describe Arm Pressure (mmHG)  +----------+--------+-------+--------+-------------------+  Subclavian 115                                            +----------+--------+-------+--------+-------------------+ +---------+--------+---+--------+--+  Vertebral PSV cm/s 129 EDV cm/s 37  +---------+--------+---+--------+--+  Left Carotid Findings: +----------+--------+--------+--------+------------+------------------+             PSV cm/s EDV cm/s Stenosis Describe     Comments            +----------+--------+--------+--------+------------+------------------+  CCA Prox   144      33                             intimal thickening  +----------+--------+--------+--------+------------+------------------+  CCA Distal 117      31                             intimal thickening  +----------+--------+--------+--------+------------+------------------+  ICA Prox   90       26                heterogenous                     +----------+--------+--------+--------+------------+------------------+  ICA Distal 109      33                                                 +----------+--------+--------+--------+------------+------------------+  ECA        79       20                                                 +----------+--------+--------+--------+------------+------------------+  +----------+--------+--------+--------+-------------------+  Subclavian PSV cm/s EDV cm/s Describe Arm Pressure (mmHG)  +----------+--------+--------+--------+-------------------+             185                                             +----------+--------+--------+--------+-------------------+ +---------+--------+---+--------+--+  Vertebral PSV cm/s 106 EDV cm/s 27  +---------+--------+---+--------+--+  Summary: Right Carotid: Velocities in the right ICA are consistent with a 1-39% stenosis. Left Carotid: Velocities in the left ICA are consistent with a 1-39% stenosis. Vertebrals:  Bilateral vertebral arteries demonstrate antegrade flow. Subclavians: Normal flow hemodynamics were seen in bilateral subclavian              arteries. *See table(s) above for measurements and observations.  Electronically signed by Antony Contras MD on 06/15/2019 at 2:14:36 PM.    Final      Labs:   Basic Metabolic Panel: Recent Labs  Lab 06/13/19 2150 06/14/19 0300 06/16/19 0636  NA 142 140 137  K 3.1* 3.1* 3.9  CL 104 107 107  CO2 25 24 22   GLUCOSE 90 108* 105*  BUN 15 14 18   CREATININE 0.85 0.75 0.82  CALCIUM 9.5 9.2 9.1  MG  --  2.1  --    GFR Estimated Creatinine Clearance: 61.5 mL/min (by C-G formula based on SCr of 0.82 mg/dL). Liver Function Tests: No results for input(s): AST, ALT, ALKPHOS, BILITOT, PROT, ALBUMIN in the last 168 hours. No results for input(s): LIPASE, AMYLASE in the last 168 hours. No results for input(s): AMMONIA in the last 168 hours. Coagulation profile No results for input(s): INR, PROTIME in the last 168 hours.  CBC: Recent Labs  Lab 06/13/19 2150 06/16/19 0636  WBC 5.3 6.4  HGB 13.0 11.8*  HCT 41.1 35.7*  MCV 100.7* 98.1  PLT 191 166   Cardiac Enzymes: Recent Labs  Lab 06/14/19 0947  CKTOTAL 122   BNP: Invalid input(s): POCBNP CBG: No results for input(s): GLUCAP in the last 168 hours. D-Dimer No results for input(s): DDIMER in the last 72 hours. Hgb  A1c Recent Labs    06/14/19 0300  HGBA1C 5.4   Lipid Profile Recent Labs    06/14/19 0300  CHOL 184  HDL 64  LDLCALC 107*  TRIG 67  CHOLHDL 2.9   Thyroid function studies No results for input(s): TSH, T4TOTAL, T3FREE, THYROIDAB in the last 72 hours.  Invalid input(s): FREET3 Anemia work up No results for input(s): VITAMINB12, FOLATE, FERRITIN, TIBC, IRON, RETICCTPCT in the last 72 hours. Microbiology Recent Results (from the past 240 hour(s))  SARS Coronavirus 2     Status: None   Collection Time: 06/14/19 12:49 AM  Result Value Ref Range Status   SARS Coronavirus 2 NOT DETECTED NOT DETECTED Final    Comment: (NOTE) SARS-CoV-2 target nucleic acids are NOT DETECTED. The SARS-CoV-2 RNA is generally detectable in upper and lower respiratory specimens during the acute phase of infection.  Negative  results do not preclude SARS-CoV-2 infection, do not rule out co-infections with other pathogens, and should not be used as the sole basis for treatment or other patient management decisions.  Negative results must be combined with clinical observations, patient history, and epidemiological information. The expected result is Not Detected. Fact Sheet for Patients: http://www.biofiredefense.com/wp-content/uploads/2020/03/BIOFIRE-COVID -19-patients.pdf Fact Sheet for Healthcare Providers: http://www.biofiredefense.com/wp-content/uploads/2020/03/BIOFIRE-COVID -19-hcp.pdf This test is not yet approved or cleared by the [place] and  has been authorized for detection and/or diagnosis of SARS-CoV-2 by FDA under an Emergency Use Authorization (EUA).  This EUA will remain in effec t (meaning this test can be used) for the duration of  the COVID-19 declaration under Section 564(b)(1) of the Act, 21 U.S.C. section 360bbb-3(b)(1), unless the authorization is terminated or revoked sooner. Performed at [facility], [place] [address], [place], [place] [address]       Discharge Instructions:   Discharge Instructions    Ambulatory referral to Neurology   Complete by: As directed    An appointment is requested in approximately: 4 week   Diet - low sodium heart healthy   Complete by: As directed    Discharge instructions   Complete by: As directed    aspirin and Plavix for 3 weeks followed by aspirin alone   Increase activity slowly   Complete by: As directed      Allergies as of 06/16/2019   No Known Allergies     Medication List    STOP taking these medications   amLODipine 5 MG tablet Commonly known as: NORVASC   hydrOXYzine 25 MG tablet Commonly known as: ATARAX/VISTARIL     TAKE these medications   aspirin 81 MG EC tablet Take 1 tablet (81 mg total) by mouth daily.  Start taking on: June 17, 2019   atorvastatin 40 MG tablet Commonly known as: LIPITOR Take 1 tablet (40 mg total) by mouth daily at 6 PM.   clopidogrel 75 MG tablet Commonly known as: PLAVIX Take 1 tablet (75 mg total) by mouth daily. Start taking on: June 17, 2019   meloxicam 15 MG tablet Commonly known as: MOBIC Take 1 tablet (15 mg total) by mouth daily.      Follow-up Information    [name], MD Follow up in 1 week(s).   Specialty: Family Medicine Contact information: [address] [place] [address] [phone]            Time coordinating discharge: 35 min  Signed:  Geradine Girt DO  [facility] 06/16/2019, 4:56 PM

## 2019-06-16 NOTE — Progress Notes (Signed)
  Echocardiogram 2D Echocardiogram has been performed.  Connie Meyer 06/16/2019, 3:26 PM

## 2019-06-16 NOTE — Progress Notes (Signed)
Patient ready for discharge to home; discharge instructions given and reviewed; Rx's sent electronically; Loop recorder education completed prior to discharge from [employer] [facility]. Stroke education completed; patient ready to go; discharged out via wheelchair. Equip. Sent with patient.

## 2019-06-16 NOTE — CV Procedure (Signed)
LA, LAA without masses TV normal AV normal   Trivial eccentric AI MV normal  Trace MR PV normal  Triv PI LVEF normal With injection of agitated saline there were few bubbles very late in L sided chambes consistent with small intrapulmonary shunt  No PFO Minimal plaquing in the thoracic aorta

## 2019-06-17 ENCOUNTER — Encounter (HOSPITAL_COMMUNITY): Payer: Self-pay | Admitting: Internal Medicine

## 2019-06-18 ENCOUNTER — Ambulatory Visit: Payer: Medicare Other | Admitting: Orthopaedic Surgery

## 2019-06-19 ENCOUNTER — Encounter (HOSPITAL_COMMUNITY): Payer: Self-pay | Admitting: Internal Medicine

## 2019-06-20 ENCOUNTER — Telehealth: Payer: Self-pay | Admitting: Family Medicine

## 2019-06-20 ENCOUNTER — Other Ambulatory Visit: Payer: Self-pay

## 2019-06-20 NOTE — Telephone Encounter (Signed)
Pt needs advice on her medication, amlodipine. She doesn't know if she should still be taking it after experiencing a stroke.

## 2019-06-20 NOTE — Patient Outreach (Addendum)
[facility]) Care Management  06/20/2019  Connie Meyer 08-13-1951 [id number]  EMMI: stroke red alert Referral date: 06/20/19 Referral reason:scheduled follow up appointment Insurance: medicare Day # 1  Telephone call to patient regarding EMMI stroke red alert. HIPAA verified by patient. RNCM introduced herself and explained reason for call. Patient states she is scheduled to see her doctor on 06/24/19.  Patient states she will call today to scheduled for appointment with the neurologist.  Patient denies having any symptoms. Patient reports all of her symptoms resolved from the stroke.  Patient states she has transportation to her doctors appointments. She reports taking her medications and prescribed.  Patient confirmed she is taking PLAVIX.  RNCM discussed sign/symptoms of bleeding with patient. Advised patient to notify her doctor for these symptoms.  RNCM discussed COVID 19 precautions and symptoms.  Advised patient to contact her doctor for minor symptoms. If symptoms more severe call 911.  Patient verbalized understanding.  Advised patient they would receive another automated EMMI stroke call to assess how they are doing.  Patient advised they would receive a call from a nurse if any of their responses were abnormal.   Patient verbally agreed to ongoing EMMI automated calls. Patient voiced understanding and was appreciative of call.  RNCM advised patient to notify MD of any changes in condition prior to scheduled appointment. RNCM provided contact name and number: 4 hour nurse advise line [phone].  RNCM verified patient aware of 911 services for urgent/ emergent needs. RNCM discussed COVID 19 precautions and symptoms.  Advised patient to contact her doctor for minor symptoms. If symptoms more severe call 911.  Patient verbalized understanding.   PLAN: RNCM will close case due to patient being assessed and having no further needs.  Quinn Plowman RN,BSN,CCM [facility]  Telephonic  [phone]

## 2019-06-23 ENCOUNTER — Encounter: Payer: Self-pay | Admitting: Family Medicine

## 2019-06-23 ENCOUNTER — Telehealth (INDEPENDENT_AMBULATORY_CARE_PROVIDER_SITE_OTHER): Payer: Medicare Other | Admitting: Family Medicine

## 2019-06-23 VITALS — BP 140/84 | Wt 132.0 lb

## 2019-06-23 DIAGNOSIS — I10 Essential (primary) hypertension: Secondary | ICD-10-CM | POA: Diagnosis not present

## 2019-06-23 DIAGNOSIS — I6930 Unspecified sequelae of cerebral infarction: Secondary | ICD-10-CM

## 2019-06-23 DIAGNOSIS — I639 Cerebral infarction, unspecified: Secondary | ICD-10-CM

## 2019-06-23 NOTE — Progress Notes (Signed)
Telemedicine Encounter- SOAP NOTE Established Patient  This telephone encounter was conducted with the patient's (or proxy's) verbal consent via audio telecommunications: yes/no: Yes Patient was instructed to have this encounter in a suitably private space; and to only have persons present to whom they give permission to participate. In addition, patient identity was confirmed by use of name plus two identifiers (DOB and address).  I discussed the limitations, risks, security and privacy concerns of performing an evaluation and management service by telephone and the availability of in person appointments. I also discussed with the patient that there may be a patient responsible charge related to this service. The patient expressed understanding and agreed to proceed.  I spent a total of TIME; 0 MIN TO 60 MIN: 15 minutes talking with the patient or their proxy.  CC: Stroke follow up   Subjective   Connie Meyer is a 68 y.o. established patient. Telephone visit today for blood pressure follow up  HPI  Pt reports that she was dishcarged from the hosptial with a stroke She was admitted on 06/13/2019 and discharged on 06/16/2019 She reports that she was told to stop the amlodipine at the time of discharge from the hospital She wanted to know if she should remain off the medication  BP Readings from Last 3 Encounters:  06/23/19 140/84  06/16/19 101/84  06/06/19 136/81    She denies any headaches, vision changes, chest pains or weakness. She reports that all her symptoms resolved. She presented with right eye vision changes and her MRI brain showed ischemic changes in the left occiput IMPRESSION: 1. Small group of punctate acute/early subacute infarctions within the left occipital lobe. No hemorrhage or mass effect. 2. Mild-to-moderate chronic microvascular ischemic changes and volume loss of the brain. 3. Opacification of the left mastoid air cells and partial opacification of the  right mastoid air cells. These results were called by telephone at the time of interpretation on 06/14/2019 at 12:30 am to Dr. Tyrone Nine, who verbally acknowledged these results. Electronically Signed   By: Kristine Garbe M.D.   On: 06/14/2019 00:31   She has an appointment Cardiology and Neurology in July 2020.   Patient Active Problem List   Diagnosis Date Noted  . Acute CVA (cerebrovascular accident) ([place]) 06/14/2019  . HTN (hypertension) 06/14/2019  . Hypokalemia 06/14/2019  . H/O bilateral mastectomy 02/17/2019  . Rib pain on left side 09/18/2016  . Hiatal hernia 09/05/2016  . History of breast cancer in female-2000, 2003 03/17/2016    Past Medical History:  Diagnosis Date  . Cancer [facility])    bilateral breast    Current Outpatient Medications  Medication Sig Dispense Refill  . aspirin EC 81 MG EC tablet Take 1 tablet (81 mg total) by mouth daily. 30 tablet 0  . atorvastatin (LIPITOR) 40 MG tablet Take 1 tablet (40 mg total) by mouth daily at 6 PM. 30 tablet 1  . clopidogrel (PLAVIX) 75 MG tablet Take 1 tablet (75 mg total) by mouth daily. 21 tablet 0  . meloxicam (MOBIC) 15 MG tablet Take 1 tablet (15 mg total) by mouth daily. 90 tablet 0  . amLODipine (NORVASC) 5 MG tablet Take 5 mg by mouth daily.     No current facility-administered medications for this visit.     No Known Allergies  Social History   Socioeconomic History  . Marital status: Divorced    Spouse name: Not on file  . Number of children: 4  . Years of  education: Not on file  . Highest education level: Not on file  Occupational History  . Not on file  Social Needs  . Financial resource strain: Not on file  . Food insecurity    Worry: Not on file    Inability: Not on file  . Transportation needs    Medical: Not on file    Non-medical: Not on file  Tobacco Use  . Smoking status: Never Smoker  . Smokeless tobacco: Never Used  Substance and Sexual Activity  . Alcohol use: Yes     Alcohol/week: 0.0 standard drinks  . Drug use: No  . Sexual activity: Never  Lifestyle  . Physical activity    Days per week: Not on file    Minutes per session: Not on file  . Stress: Not on file  Relationships  . Social Herbalist on phone: Not on file    Gets together: Not on file    Attends religious service: Not on file    Active member of club or organization: Not on file    Attends meetings of clubs or organizations: Not on file    Relationship status: Not on file  . Intimate partner violence    Fear of current or ex partner: Not on file    Emotionally abused: Not on file    Physically abused: Not on file    Forced sexual activity: Not on file  Other Topics Concern  . Not on file  Social History Narrative  . Not on file    ROS Review of Systems  Constitutional: Negative for activity change, appetite change, chills and fever.  HENT: Negative for congestion, nosebleeds, trouble swallowing and voice change.   Respiratory: Negative for cough, shortness of breath and wheezing.   Gastrointestinal: Negative for diarrhea, nausea and vomiting.  Genitourinary: Negative for difficulty urinating, dysuria, flank pain and hematuria.  Musculoskeletal: Negative for back pain, joint swelling and neck pain.  Neurological: Negative for dizziness, speech difficulty, light-headedness and numbness.  See HPI. All other review of systems negative.   Objective   Vitals as reported by the patient: Today's Vitals   06/23/19 1205  BP: 140/84  Weight: 132 lb (59.9 kg)  145/74  Diagnoses and all orders for this visit:  Ischemic stroke ([place])  Essential hypertension  BP in the hospital was 025K-270W systolic.  Her amlodipine was dc'd at the time of discharge.  Advised pt to monitor her bp and if she continues to have reading of 140/90 or less to remain off the bp medications until seen by Neurology If her bp increases to above 140/90 she should start by taking amlodipine 2.5mg   And come in for bp check here in the office  She verbalizes understanding Continue plavix, asa and lipitor   I discussed the assessment and treatment plan with the patient. The patient was provided an opportunity to ask questions and all were answered. The patient agreed with the plan and demonstrated an understanding of the instructions.   The patient was advised to call back or seek an in-person evaluation if the symptoms worsen or if the condition fails to improve as anticipated.  I provided 15 minutes of non-face-to-face time during this encounter.  Forrest Moron, MD  [facility]

## 2019-06-23 NOTE — Progress Notes (Signed)
Patient recently was discharged from hospital after having a stroke.  She was prescribed a BP medication (amlodipine) and wants to know if she should continue taking it since having the stroke.  She expresses no other concerns at this time.   Denies travel/exposure within last 14-30 days.

## 2019-06-24 ENCOUNTER — Telehealth: Payer: Medicare Other | Admitting: Family Medicine

## 2019-06-25 ENCOUNTER — Telehealth: Payer: Self-pay

## 2019-06-25 NOTE — Telephone Encounter (Signed)
Appointment 06-26-2019 at 3 pm      COVID-19 Pre-Screening Questions:  . In the past 7 to 10 days have you had a cough,  shortness of breath, headache, congestion, fever (100 or greater) body aches, chills, sore throat, or sudden loss of taste or sense of smell? No . Have you been around anyone with known Covid 19.No . Have you been around anyone who is awaiting Covid 19 test results in the past 7 to 10 days? No . Have you been around anyone who has been exposed to Covid 19, or has mentioned symptoms of Covid 19 within the past 7 to 10 days? No  If you have any concerns/questions about symptoms patients report during screening (either on the phone or at threshold). Contact the provider seeing the patient or DOD for further guidance.  If neither are available contact a member of the leadership team.         Pt answered No to all Covid-19 prescreening questions. I told her to wear a mask and if she can physically come alone to please do so. We are limiting the amount of people who is coming  Into the office. I told her if anything changes between now and her appointment time to please give [place] a call back. Pt verbalized understanding.

## 2019-06-26 ENCOUNTER — Ambulatory Visit (INDEPENDENT_AMBULATORY_CARE_PROVIDER_SITE_OTHER): Payer: Medicare Other | Admitting: Student

## 2019-06-26 ENCOUNTER — Other Ambulatory Visit: Payer: Self-pay

## 2019-06-26 DIAGNOSIS — I639 Cerebral infarction, unspecified: Secondary | ICD-10-CM

## 2019-06-26 LAB — CUP PACEART INCLINIC DEVICE CHECK
Date Time Interrogation Session: 20200702150724
Implantable Pulse Generator Implant Date: 20200622

## 2019-06-26 NOTE — Progress Notes (Signed)
ILR wound check in clinic. Steri strips removed. Wound well healed. Home monitor transmitting nightly. No episodes. Questions answered. R wave 0.63.    Legrand Como [address] Accident, PA-C 06/26/2019 3:07 PM

## 2019-06-26 NOTE — Telephone Encounter (Signed)
Concern addressed during telemedicine visit with Dr Nolon Rod on 06/23/2019

## 2019-07-03 ENCOUNTER — Ambulatory Visit (INDEPENDENT_AMBULATORY_CARE_PROVIDER_SITE_OTHER): Payer: Medicare Other | Admitting: Orthopaedic Surgery

## 2019-07-03 ENCOUNTER — Ambulatory Visit (INDEPENDENT_AMBULATORY_CARE_PROVIDER_SITE_OTHER): Payer: Medicare Other

## 2019-07-03 ENCOUNTER — Encounter: Payer: Self-pay | Admitting: Orthopaedic Surgery

## 2019-07-03 ENCOUNTER — Other Ambulatory Visit: Payer: Self-pay

## 2019-07-03 VITALS — BP 141/69 | HR 92 | Resp 14 | Ht 66.0 in | Wt 136.0 lb

## 2019-07-03 DIAGNOSIS — M25511 Pain in right shoulder: Secondary | ICD-10-CM

## 2019-07-03 DIAGNOSIS — I639 Cerebral infarction, unspecified: Secondary | ICD-10-CM | POA: Diagnosis not present

## 2019-07-03 DIAGNOSIS — G8929 Other chronic pain: Secondary | ICD-10-CM

## 2019-07-03 MED ORDER — LIDOCAINE HCL 2 % IJ SOLN
2.0000 mL | INTRAMUSCULAR | Status: AC | PRN
  Administered 2019-07-03: 2 mL

## 2019-07-03 MED ORDER — BUPIVACAINE HCL 0.5 % IJ SOLN
2.0000 mL | INTRAMUSCULAR | Status: AC | PRN
  Administered 2019-07-03: 2 mL via INTRA_ARTICULAR

## 2019-07-03 MED ORDER — METHYLPREDNISOLONE ACETATE 40 MG/ML IJ SUSP
80.0000 mg | INTRAMUSCULAR | Status: AC | PRN
  Administered 2019-07-03: 80 mg via INTRA_ARTICULAR

## 2019-07-03 NOTE — Progress Notes (Signed)
Office Visit Note   Patient: Connie Meyer           Date of Birth: [birth year]           MRN: [id number] Visit Date: 07/03/2019              Requested by: [name], MD [address] [place],  [place] [address] PCP: [name], MD   Assessment & Plan: Visit Diagnoses:  1. Chronic right shoulder pain     Plan: Impingement syndrome right shoulder.  Long discussion regarding diagnostic possibilities including rotator cuff tear.  Will inject the shoulder with cortisone and monitor response.  If no improvement over the next 3 to 4 weeks would consider an MRI scan  Follow-Up Instructions: Return if symptoms worsen or fail to improve.   Orders:  Orders Placed This Encounter  Procedures   Large Joint Inj: R subacromial bursa   XR Shoulder Right   No orders of the defined types were placed in this encounter.     Procedures: Large Joint Inj: R subacromial bursa on 07/03/2019 5:14 PM Indications: pain and diagnostic evaluation Details: 25 G 1.5 in needle, anterolateral approach  Arthrogram: No  Medications: 2 mL lidocaine 2 %; 2 mL bupivacaine 0.5 %; 80 mg methylPREDNISolone acetate 40 MG/ML Consent was given by the patient. Immediately prior to procedure a time out was called to verify the correct patient, procedure, equipment, support staff and site/side marked as required. Patient was prepped and draped in the usual sterile fashion.       Clinical Data: No additional findings.   Subjective: Chief Complaint  Patient presents with   Right Shoulder - Injury   Connie Meyer is a 68 year old female who fell off a stair climber in February, 2020. She had a stoke 06/13/2019. She has right shoulder pain, limited range of motion, no weakness, no numbness, some difficulty sleeping at night, no surgery to right shoulder, she is not diabetic. She takes Excedrin or Aleve which does not help. Her pain is constant. Has been experiencing shoulder pain since the injury  as mentioned above.  Pain is worse with overhead activity.  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  Having difficulty wearing her sleeve because of the shoulder pain.  No residual from the above-mentioned stroke  HPI  Review of Systems  Constitutional: Negative for fatigue.  HENT: Negative for trouble swallowing.   Eyes: Negative for pain.  Respiratory: Negative for shortness of breath.   Cardiovascular: Negative for leg swelling.  Gastrointestinal: Negative for constipation.  Endocrine: Negative for cold intolerance.  Genitourinary: Negative for difficulty urinating.  Musculoskeletal: Positive for neck pain. Negative for joint swelling.  Skin: Negative for rash.  Allergic/Immunologic: Negative for food allergies.  Neurological: Negative for weakness.  Hematological: Bruises/bleeds easily.  Psychiatric/Behavioral: Positive for sleep disturbance.     Objective: Vital Signs: BP (!) 141/69 (BP Location: Left Arm, Patient Position: Sitting, Cuff Size: Normal)    Pulse 92    Resp 14    Ht 5\' 6"  (1.676 m)    Wt 136 lb (61.7 kg)    BMI 21.95 kg/m   Physical Exam Constitutional:      Appearance: She is well-developed.  Eyes:     Pupils: Pupils are equal, round, and reactive to light.  Pulmonary:     Effort: Pulmonary effort is normal.  Skin:    General: Skin is warm and dry.  Neurological:  Mental Status: She is alert and oriented to person, place, and time.  Psychiatric:        Behavior: Behavior normal.     Ortho Exam awake alert and oriented x3.  Comfortable sitting.  Positive impingement with internal and external rotation.  Positive empty can testing.  No grating or crepitation with motion of the shoulder.  Biceps intact.  Skin intact.  Does have chronic lymphedema of her right upper extremity from her breast surgery.  No AC joint pain.  Minimal anterior subacromial discomfort.  Good strength Specialty Comments:  No specialty  comments available.  Imaging: Xr Shoulder Right  Result Date: 07/03/2019 Films of the right shoulder obtained in several projections.  No evidence of a fracture.  Humeral head is centered about the glenoid.  Some prominence of the greater tuberosity.  Type II acromium.  No ectopic calcification.  Old surgical clips in the chest wall.  No significant arthritis of the acromioclavicular joint    PMFS History: Patient Active Problem List   Diagnosis Date Noted   Acute CVA (cerebrovascular accident) ([place]) 06/14/2019   HTN (hypertension) 06/14/2019   Hypokalemia 06/14/2019   H/O bilateral mastectomy 02/17/2019   Rib pain on left side 09/18/2016   Hiatal hernia 09/05/2016   History of breast cancer in female-2000, 2003 03/17/2016   Past Medical History:  Diagnosis Date   Cancer [facility])    bilateral breast   Stroke [facility])     History reviewed. No pertinent family history.  Past Surgical History:  Procedure Laterality Date   BREAST SURGERY     BUBBLE STUDY  06/16/2019   Procedure: BUBBLE STUDY;  Surgeon: Fay Records, MD;  Location: [place];  Service: Cardiovascular;;   LOOP RECORDER INSERTION N/A 06/16/2019   Procedure: LOOP RECORDER INSERTION;  Surgeon: Evans Lance, MD;  Location: [place] CV LAB;  Service: Cardiovascular;  Laterality: N/A;   TEE WITHOUT CARDIOVERSION N/A 06/16/2019   Procedure: TRANSESOPHAGEAL ECHOCARDIOGRAM (TEE);  Surgeon: Fay Records, MD;  Location: [facility] ENDOSCOPY;  Service: Cardiovascular;  Laterality: N/A;   Social History   Occupational History   Not on file  Tobacco Use   Smoking status: Never Smoker   Smokeless tobacco: Never Used  Substance and Sexual Activity   Alcohol use: Yes    Alcohol/week: 1.0 standard drinks    Types: 1 Glasses of wine per week   Drug use: No   Sexual activity: Never

## 2019-07-04 ENCOUNTER — Ambulatory Visit: Payer: Medicare Other | Admitting: Orthopaedic Surgery

## 2019-07-07 ENCOUNTER — Ambulatory Visit (INDEPENDENT_AMBULATORY_CARE_PROVIDER_SITE_OTHER): Payer: Medicare Other | Admitting: Family Medicine

## 2019-07-07 ENCOUNTER — Encounter: Payer: Self-pay | Admitting: Family Medicine

## 2019-07-07 ENCOUNTER — Other Ambulatory Visit: Payer: Self-pay

## 2019-07-07 VITALS — BP 120/80 | HR 75 | Temp 97.4°F | Ht 66.0 in | Wt 138.0 lb

## 2019-07-07 DIAGNOSIS — I6930 Unspecified sequelae of cerebral infarction: Secondary | ICD-10-CM | POA: Diagnosis not present

## 2019-07-07 DIAGNOSIS — I639 Cerebral infarction, unspecified: Secondary | ICD-10-CM

## 2019-07-07 DIAGNOSIS — J302 Other seasonal allergic rhinitis: Secondary | ICD-10-CM | POA: Diagnosis not present

## 2019-07-07 DIAGNOSIS — R5383 Other fatigue: Secondary | ICD-10-CM | POA: Diagnosis not present

## 2019-07-07 DIAGNOSIS — I10 Essential (primary) hypertension: Secondary | ICD-10-CM | POA: Diagnosis not present

## 2019-07-07 MED ORDER — FLUTICASONE PROPIONATE 50 MCG/ACT NA SUSP: 1.0000 | Freq: Two times a day (BID) | NASAL | 6 refills | Status: DC

## 2019-07-07 NOTE — Progress Notes (Signed)
7/13/20209:49 AM  Connie Meyer Aug 10, 1951, 68 y.o., female [id number]  Chief Complaint  Patient presents with  . Hypertension    HPI:   Patient is a 68 y.o. 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, Left occiput Saw cards June 26 2019 for review of loop recorder  Also saw ortho several days ago, injection into R subacromial bursa  Patient reports she is overall doing well No vision changes, speech changes, focal weakness, chest pain, SOB, abdominal pain, nausea or vomting Has noticed some fatigue since her CVA, sleeping well Denies any black tarry stools or hematuria No fever or chills Shoulder doing better after injection Having issues with her allergies, constant runny nose and sneezing, alternating between claritin and zyrtec Nonsmoker  Sees neurology the 27th of July On plavix, ASA and statin  Lab Results  Component Value Date   CHOL 184 06/14/2019   HDL 64 06/14/2019   LDLCALC 107 (H) 06/14/2019   TRIG 67 06/14/2019   CHOLHDL 2.9 06/14/2019     Depression screen [facility] 2/9 07/07/2019 06/06/2019 04/18/2019  Decreased Interest 0 0 0  Down, Depressed, Hopeless 0 0 0  PHQ - 2 Score 0 0 0    Fall Risk  07/07/2019 06/06/2019 04/18/2019 02/17/2019 02/17/2019  Falls in the past year? 0 1 0 1 1  Number falls in past yr: 0 0 - 1 1  Injury with Fall? 0 1 - 1 1  Risk Factor Category  - - - - -  Risk for fall due to : - - - - History of fall(s)  Follow up - - - Falls evaluation completed Falls evaluation completed     No Known Allergies  Prior to Admission medications   Medication Sig Start Date End Date Taking? Authorizing Provider  amLODipine (NORVASC) 5 MG tablet Take 5 mg by mouth daily.   Yes [provider]  aspirin EC 81 MG EC tablet Take 1 tablet (81 mg total) by mouth daily. 06/17/19  Yes Eulogio Bear U, DO  atorvastatin (LIPITOR) 40 MG tablet Take 1  tablet (40 mg total) by mouth daily at 6 PM. 06/16/19  Yes Eliseo Squires, Jessica U, DO  clopidogrel (PLAVIX) 75 MG tablet Take 1 tablet (75 mg total) by mouth daily. 06/17/19  Yes Geradine Girt, DO  meloxicam (MOBIC) 15 MG tablet Take 1 tablet (15 mg total) by mouth daily. 06/06/19  Yes [name], MD    Past Medical History:  Diagnosis Date  . Cancer [facility])    bilateral breast  . Stroke [facility])     Past Surgical History:  Procedure Laterality Date  . BREAST SURGERY    . BUBBLE STUDY  06/16/2019   Procedure: BUBBLE STUDY;  Surgeon: Fay Records, MD;  Location: [facility] ENDOSCOPY;  Service: Cardiovascular;;  . LOOP RECORDER INSERTION N/A 06/16/2019   Procedure: LOOP RECORDER INSERTION;  Surgeon: Evans Lance, MD;  Location: [place] CV LAB;  Service: Cardiovascular;  Laterality: N/A;  . TEE WITHOUT CARDIOVERSION N/A 06/16/2019   Procedure: TRANSESOPHAGEAL ECHOCARDIOGRAM (TEE);  Surgeon: Fay Records, MD;  Location: [facility] ENDOSCOPY;  Service: Cardiovascular;  Laterality: N/A;    Social History   Tobacco Use  . Smoking status: Never Smoker  . Smokeless tobacco: Never Used  Substance Use Topics  . Alcohol use: Yes    Alcohol/week: 1.0 standard drinks    Types: 1 Glasses  of wine per week    No family history on file.  ROS Per hpi  OBJECTIVE:  Today's Vitals   07/07/19 0940  BP: 120/80  Pulse: 75  Temp: (!) 97.4 F (36.3 C)  TempSrc: Oral  SpO2: 99%  Weight: 138 lb (62.6 kg)  Height: 5\' 6"  (1.676 m)   Body mass index is 22.27 kg/m.   Physical Exam Vitals signs and nursing note reviewed.  Constitutional:      Appearance: She is well-developed.  HENT:     Head: Normocephalic and atraumatic.     Mouth/Throat:     Pharynx: No oropharyngeal exudate.  Eyes:     General: No scleral icterus.    Conjunctiva/sclera: Conjunctivae normal.     Pupils: Pupils are equal, round, and reactive to light.  Neck:     Musculoskeletal: Neck supple.  Cardiovascular:     Rate and  Rhythm: Normal rate and regular rhythm.     Heart sounds: Normal heart sounds. No murmur. No friction rub. No gallop.   Pulmonary:     Effort: Pulmonary effort is normal.     Breath sounds: Normal breath sounds. No wheezing or rales.  Chest:     Comments: Left upper chest wall - incision well healed, no erythema or draiange Skin:    General: Skin is warm and dry.  Neurological:     Mental Status: She is alert and oriented to person, place, and time.    ASSESSMENT and PLAN  1. Ischemic stroke ([place]) wo sequela, on plavix, ASA and statin, BP at goal, loop recorder in place. Recheck lipids at next OV, goal LDL < 70. Has followup with neurology later this month.  2. Essential hypertension Controlled. Continue current regime.   3. Fatigue, unspecified type Push fluids - CBC - Comprehensive metabolic panel - TSH  4. Seasonal allergies Not controlled. Adding flonase. - fluticasone (FLONASE) 50 MCG/ACT nasal spray; Place 1 spray into both nostrils 2 (two) times daily.  Return in about 3 months (around 10/07/2019).    [name], MD Primary Care at [place], [place] [address] Ph.  [phone] Fax [phone]

## 2019-07-08 LAB — CBC
Hematocrit: 37.1 % (ref 34.0–46.6)
Hemoglobin: 12.5 g/dL (ref 11.1–15.9)
MCH: 31.7 pg (ref 26.6–33.0)
MCHC: 33.7 g/dL (ref 31.5–35.7)
MCV: 94 fL (ref 79–97)
Platelets: 212 10*3/uL (ref 150–450)
RBC: 3.94 x10E6/uL (ref 3.77–5.28)
RDW: 11.6 % — ABNORMAL LOW (ref 11.7–15.4)
WBC: 6.1 10*3/uL (ref 3.4–10.8)

## 2019-07-08 LAB — COMPREHENSIVE METABOLIC PANEL
ALT: 15 IU/L (ref 0–32)
AST: 19 IU/L (ref 0–40)
Albumin/Globulin Ratio: 1.8 (ref 1.2–2.2)
Albumin: 4.6 g/dL (ref 3.8–4.8)
Alkaline Phosphatase: 68 IU/L (ref 39–117)
BUN/Creatinine Ratio: 15 (ref 12–28)
BUN: 12 mg/dL (ref 8–27)
Bilirubin Total: 0.5 mg/dL (ref 0.0–1.2)
CO2: 23 mmol/L (ref 20–29)
Calcium: 9.1 mg/dL (ref 8.7–10.3)
Chloride: 104 mmol/L (ref 96–106)
Creatinine, Ser: 0.81 mg/dL (ref 0.57–1.00)
GFR calc Af Amer: 86 mL/min/{1.73_m2} (ref >59)
GFR calc non Af Amer: 75 mL/min/{1.73_m2} (ref >59)
Globulin, Total: 2.5 g/dL (ref 1.5–4.5)
Glucose: 88 mg/dL (ref 65–99)
Potassium: 4.5 mmol/L (ref 3.5–5.2)
Sodium: 141 mmol/L (ref 134–144)
Total Protein: 7.1 g/dL (ref 6.0–8.5)

## 2019-07-08 LAB — TSH: TSH: 2.44 u[IU]/mL (ref 0.450–4.500)

## 2019-07-14 ENCOUNTER — Telehealth: Payer: Self-pay | Admitting: Physician Assistant

## 2019-07-14 NOTE — Telephone Encounter (Signed)
Paged by answering service. Patient states her ILR is gone. She can't feel. Asymptomatic. Will call us in morning for device check.

## 2019-07-15 ENCOUNTER — Other Ambulatory Visit: Payer: Self-pay

## 2019-07-15 ENCOUNTER — Ambulatory Visit
Admission: RE | Admit: 2019-07-15 | Discharge: 2019-07-15 | Disposition: A | Discharge status: Home / Self Care | Payer: Medicare Other | Source: Ambulatory Visit | Attending: Internal Medicine | Admitting: Internal Medicine

## 2019-07-15 ENCOUNTER — Telehealth: Payer: Self-pay | Admitting: Internal Medicine

## 2019-07-15 ENCOUNTER — Ambulatory Visit (INDEPENDENT_AMBULATORY_CARE_PROVIDER_SITE_OTHER): Payer: Medicare Other | Admitting: *Deleted

## 2019-07-15 ENCOUNTER — Encounter (HOSPITAL_COMMUNITY): Payer: Self-pay | Admitting: Internal Medicine

## 2019-07-15 DIAGNOSIS — Z95818 Presence of other cardiac implants and grafts: Secondary | ICD-10-CM | POA: Diagnosis not present

## 2019-07-15 DIAGNOSIS — I639 Cerebral infarction, unspecified: Secondary | ICD-10-CM

## 2019-07-15 NOTE — Progress Notes (Signed)
Insertion appears well healed. Pt reported "scab" came off area on 07/14/19.No evidence of drainage, redness, or edema. Able to palpate firm area adjacent to incision site. Attempted to interrogate device and  unable to connect with loop. Tried 3 different programmers with industry assistance no contact made. Dr Caryl Comes assessed patient and patient sent for cx x-ray to further evaluate presence of loop.

## 2019-07-15 NOTE — Telephone Encounter (Signed)
°  Patient called and states that her loop recorder is no longer implanted .

## 2019-07-15 NOTE — Telephone Encounter (Signed)
Patient reports that she thinks her LINQ device is no longer in her body. Reassured patient that LINQ transmission was received 07/14/19. Explained that  would not be able to receive transmission showing a heart rhythm if device was not in her body. Pt states she has no drainage, redness or edema at wound site and can not hardly find the incision site.  Pt has been attempting to send manual transmissions for past 2 days on numerous occasions and gets an X on the monitor. [employer] tech services # provided to assist with trouble shooting monitor.

## 2019-07-15 NOTE — Telephone Encounter (Signed)
Contacted and informed that loop recorder not present on  chest x-ray per Dr Caryl Comes. Dr Lovena Le will be made aware of the situation and someone will be in contact after he reviews her chart.

## 2019-07-15 NOTE — Telephone Encounter (Signed)
Returned call to Pt.  Per Pt she had a scab over her healing incision that fell off "somewhere".  Now she thinks the loop fell out with the scab because she cannot feel the loop anymore.  Not sure after a month that her loop could have "fallen out".  Can she be seen by device to confirm Pt has loop?

## 2019-07-15 NOTE — Patient Instructions (Signed)
Go directly to [facility] at [facility] for chest x-ray.

## 2019-07-18 ENCOUNTER — Telehealth: Payer: Self-pay | Admitting: Internal Medicine

## 2019-07-18 NOTE — Telephone Encounter (Signed)
Spoke with patient sand presented with options from Dr Lovena Le. Pt given opion to have a larger device placed as outpatient at [facility] or to continue watchful waiting for symptoms.  Patient decided she wishes to have device placed at [facility] by Dr Lovena Le. Informed she will receive call from [facility]  concerning instructions on how to proceed with next steps.

## 2019-07-21 ENCOUNTER — Ambulatory Visit (INDEPENDENT_AMBULATORY_CARE_PROVIDER_SITE_OTHER): Payer: Medicare Other | Admitting: *Deleted

## 2019-07-21 DIAGNOSIS — I639 Cerebral infarction, unspecified: Secondary | ICD-10-CM | POA: Diagnosis not present

## 2019-07-21 LAB — CUP PACEART REMOTE DEVICE CHECK
Date Time Interrogation Session: 20200727074044
Implantable Pulse Generator Implant Date: 20200622

## 2019-07-23 ENCOUNTER — Ambulatory Visit (INDEPENDENT_AMBULATORY_CARE_PROVIDER_SITE_OTHER): Payer: Medicare Other | Admitting: Adult Health

## 2019-07-23 ENCOUNTER — Encounter: Payer: Self-pay | Admitting: Adult Health

## 2019-07-23 ENCOUNTER — Other Ambulatory Visit: Payer: Self-pay

## 2019-07-23 VITALS — BP 150/90 | HR 91 | Temp 99.6°F | Ht 66.0 in | Wt 138.6 lb

## 2019-07-23 DIAGNOSIS — I10 Essential (primary) hypertension: Secondary | ICD-10-CM | POA: Diagnosis not present

## 2019-07-23 DIAGNOSIS — E785 Hyperlipidemia, unspecified: Secondary | ICD-10-CM | POA: Diagnosis not present

## 2019-07-23 DIAGNOSIS — I639 Cerebral infarction, unspecified: Secondary | ICD-10-CM

## 2019-07-23 NOTE — Progress Notes (Signed)
[facility] [address] [place]. [place] [address] [phone]       HOSPITAL FOLLOW UP NOTE  Ms. [name] Date of Birth:  [birth year] Medical Record Number:  [id number]   Reason for Referral:  hospital stroke follow up    CHIEF COMPLAINT:  Chief Complaint  Patient presents with  . Hospitalization Follow-up    Stroke f/u. Alone. Treatment room. No concerns at this time.     HPI: Connie Meyer being seen today for in office hospital follow-up regarding cryptogenic stroke on 06/13/2019.  History obtained from patient and chart review. Reviewed all radiology images and labs personally.  [name] is a 68 y.o. female with no significant past medical hx other than previous breast cancer presenting with transient visual changes in the setting of recently diagnosed hypertension. She did not receive IV t-PA due to late presentation (>4.5 hours from time of onset).  Stroke work-up revealed punctate acute/early subacute infarctions within the left occipital lobe embolic pattern secondary to unknown source with residual superior homonymous quadrantanopia.  MRI showed punctate acute/early subacute infarctions within the left occipital lobe.  TCD and carotid Dopplers unremarkable.  2D echo unremarkable without cardiac source is identified.  TEE no cardiac source of embolus or PFO therefore loop recorder placed to assess for atrial fibrillation.  LDL 107 A1c 5.4.  Recommended DAPT for 3 weeks and aspirin alone.  Initiated atorvastatin 80 mg daily for HTN management.  HTN stable.  Other stroke risk factors include advanced age and EtOH use but no prior history of stroke.  Discharged home in stable condition without residual deficits.  No residual deficits - she does question if vision slightly worse such as film over eyes -she plans on scheduling visit with ophthalmology Completed 3 weeks DAPT with recommendation of continuing aspirin alone but  discontinued both aspirin and Plavix as she did not have additional refills Continues on atorvastatin without myalgias Blood pressure 150/90 - at home typically 130s Loop recorder apparently fell out shortly after implanted.  Cardiology aware and plans on replacing soon. No further concerns at this time.     ROS:   14 system review of systems performed and negative with exception of decreased vision  PMH:  Past Medical History:  Diagnosis Date  . Cancer ([place])    bilateral breast  . HTN (hypertension)   . Stroke [facility])     PSH:  Past Surgical History:  Procedure Laterality Date  . BREAST SURGERY    . BUBBLE STUDY  06/16/2019   Procedure: BUBBLE STUDY;  Surgeon: Fay Records, MD;  Location: [facility] ENDOSCOPY;  Service: Cardiovascular;;  . LOOP RECORDER INSERTION N/A 06/16/2019   Procedure: LOOP RECORDER INSERTION;  Surgeon: Evans Lance, MD;  Location: [facility] CV LAB;  Service: Cardiovascular;  Laterality: N/A;  . TEE WITHOUT CARDIOVERSION N/A 06/16/2019   Procedure: TRANSESOPHAGEAL ECHOCARDIOGRAM (TEE);  Surgeon: Fay Records, MD;  Location: [facility] ENDOSCOPY;  Service: Cardiovascular;  Laterality: N/A;    Social History:  Social History   Socioeconomic History  . Marital status: Divorced    Spouse name: Not on file  . Number of children: 4  . Years of education: Not on file  . Highest education level: Not on file  Occupational History  . Not on file  Social Needs  . Financial resource strain: Not on file  . Food insecurity    Worry: Not on file    Inability: Not on file  .  Transportation needs    Medical: Not on file    Non-medical: Not on file  Tobacco Use  . Smoking status: Never Smoker  . Smokeless tobacco: Never Used  Substance and Sexual Activity  . Alcohol use: Yes    Alcohol/week: 1.0 standard drinks    Types: 1 Glasses of wine per week  . Drug use: No  . Sexual activity: Never  Lifestyle  . Physical activity    Days per week: Not on file    Minutes  per session: Not on file  . Stress: Not on file  Relationships  . Social Herbalist on phone: Not on file    Gets together: Not on file    Attends religious service: Not on file    Active member of club or organization: Not on file    Attends meetings of clubs or organizations: Not on file    Relationship status: Not on file  . Intimate partner violence    Fear of current or ex partner: Not on file    Emotionally abused: Not on file    Physically abused: Not on file    Forced sexual activity: Not on file  Other Topics Concern  . Not on file  Social History Narrative  . Not on file    Family History: No family history on file.  Medications:   Current Outpatient Medications on File Prior to Visit  Medication Sig Dispense Refill  . amLODipine (NORVASC) 5 MG tablet Take 5 mg by mouth daily.    [name] atorvastatin (LIPITOR) 40 MG tablet Take 1 tablet (40 mg total) by mouth daily at 6 PM. 30 tablet 1  . fluticasone (FLONASE) 50 MCG/ACT nasal spray Place 1 spray into both nostrils 2 (two) times daily. 16 g 6  . aspirin EC 81 MG EC tablet Take 1 tablet (81 mg total) by mouth daily. (Patient not taking: Reported on 07/23/2019) 30 tablet 0   No current facility-administered medications on file prior to visit.     Allergies:  No Known Allergies   Physical Exam  Vitals:   07/23/19 1456  BP: (!) 150/90  Pulse: 91  Temp: 99.6 F (37.6 C)  TempSrc: Oral  Weight: 138 lb 9.6 oz (62.9 kg)  Height: 5\' 6"  (1.676 m)   Body mass index is 22.37 kg/m. No exam data present  Depression screen [facility] 2/9 07/23/2019  Decreased Interest 0  Down, Depressed, Hopeless 0  PHQ - 2 Score 0     General: well developed, well nourished,  pleasant middle-age Caucasian female, seated, in no evident distress Head: head normocephalic and atraumatic.   Neck: supple with no carotid or supraclavicular bruits Cardiovascular: regular rate and rhythm, no murmurs Musculoskeletal: no deformity Skin:   no rash/petichiae Vascular:  Normal pulses all extremities   Neurologic Exam Mental Status: Awake and fully alert. Oriented to place and time. Recent and remote memory intact. Attention span, concentration and fund of knowledge appropriate. Mood and affect appropriate.  Cranial Nerves: Fundoscopic exam reveals sharp disc margins. Pupils equal, briskly reactive to light. Extraocular movements full without nystagmus. Visual fields full to confrontation. Hearing intact. Facial sensation intact. Face, tongue, palate moves normally and symmetrically.  Motor: Normal bulk and tone. Normal strength in all tested extremity muscles. Sensory.: intact to touch , pinprick , position and vibratory sensation.  Coordination: Rapid alternating movements normal in all extremities. Finger-to-nose and heel-to-shin performed accurately bilaterally. Gait and Station: Arises from chair without difficulty. Stance is  normal. Gait demonstrates normal stride length and balance Reflexes: 1+ and symmetric. Toes downgoing.     NIHSS  0 Modified Rankin  0   Diagnostic Data (Labs, Imaging, Testing)  Mr Brain Wo Contrast 06/14/2019 IMPRESSION:  1. Small group of punctate acute/early subacute infarctions within the left occipital lobe. No hemorrhage or mass effect.  2. Mild-to-moderate chronic microvascular ischemic changes and volume loss of the brain.  3. Opacification of the left mastoid air cells and partial opacification of the right mastoid air cells.    Transthoracic Echocardiogram  06/14/2019 IMPRESSIONS 1. The left ventricle has normal systolic function, with an ejection fraction of 55-60%. The cavity size was normal. Left ventricular diastolic Doppler parameters are consistent with impaired relaxation. Indeterminate filling pressures The E/e' is 8-15. 2. The right ventricle has normal systolic function. The cavity was normal. There is no increase in right ventricular wall thickness. 3. The mitral valve  is grossly normal. 4. The tricuspid valve is grossly normal. 5. The aortic valve is grossly normal. No stenosis of the aortic valve.   Bilateral Carotid Dopplers  No significant bilateral extracranial carotid stenosis.  Transcranial Dopplers - Technically limited due to possible poor windows.Low mean flow velocities in posterior circulation vessels of unclear significance   EKG - SR rate 71 BPM. (See cardiology reading for complete details)   ASSESSMENT: Connie Meyer is a 68 y.o. year old female here with punctate acute/early subacute infarctions within the left occipital lobe embolic pattern on 8/54/6270 secondary to undetermined source therefore loop recorder placed to rule out atrial fibrillation. Vascular risk factors include HTN, HLD and EtOH use.  Recovered well from a stroke standpoint without residual deficits or reoccurring symptoms.  She questions possible worsening of vision but is unsure if she just needs new prescription.    PLAN:  1. Cryptogenic stroke: Advised to restart aspirin 81 mg daily  and continue atorvastatin 80 mg daily for secondary stroke prevention. Maintain strict control of hypertension with blood pressure goal below 130/90, diabetes with hemoglobin A1c goal below 6.5% and cholesterol with LDL cholesterol (bad cholesterol) goal below 70 mg/dL.  I also advised the patient to eat a healthy diet with plenty of whole grains, cereals, fruits and vegetables, exercise regularly with at least 30 minutes of continuous activity daily and maintain ideal body weight.  Follow-up with cardiology regarding replacement of loop recorder for ongoing monitoring of potential atrial fibrillation 2. HTN: Advised to continue current treatment regimen.  Today's BP stable.  Advised to continue to monitor at home along with continued follow-up with PCP for management 3. HLD: Advised to continue current treatment regimen along with continued follow-up with PCP for future  prescribing and monitoring of lipid panel 4. Blurred vision: Encouraged scheduling evaluation by ophthalmology to further assess if residual stroke deficit or if she needs new prescription glasses 5. Discussion regarding Remi Haggard trial with research team providing additional information as patient is interested in participating    Follow up in 3 months or call earlier if needed   Greater than 50% of time during this 45 minute visit was spent on counseling, explanation of diagnosis of cryptogenic stroke, reviewing risk factor management of HTN, HLD, planning of further management along with potential future management, and discussion with patient and family answering all questions.    Venancio Poisson, AGNP-BC  [facility] [address] [place], [place] [address]  Phone [phone] Fax [phone] Note: This document was prepared with digital dictation and possible smart phrase  technology. Any transcriptional errors that result from this process are unintentional.

## 2019-07-23 NOTE — Patient Instructions (Addendum)
Restart aspirin 81 mg daily  And continue lipitor  for secondary stroke prevention  Continue to follow up with PCP regarding cholesterol and blood pressure management   Have loop recorder replaced and continue to monitor for atrial fibrillation   Continue to monitor blood pressure at home  Maintain strict control of hypertension with blood pressure goal below 130/90, diabetes with hemoglobin A1c goal below 6.5% and cholesterol with LDL cholesterol (bad cholesterol) goal below 70 mg/dL. I also advised the patient to eat a healthy diet with plenty of whole grains, cereals, fruits and vegetables, exercise regularly and maintain ideal body weight.  Followup in the future with me in 3 months or call earlier if needed       Thank you for coming to see [place] at [facility]. I hope we have been able to provide you high quality care today.  You may receive a patient satisfaction survey over the next few weeks. We would appreciate your feedback and comments so that we may continue to improve ourselves and the health of our patients.

## 2019-07-25 NOTE — Progress Notes (Signed)
I agree with the above plan 

## 2019-07-31 ENCOUNTER — Telehealth: Payer: Self-pay

## 2019-07-31 NOTE — Telephone Encounter (Signed)
Left detailed message requesting call back.  Need to schedule Pt for biotronic loop at [facility].  Will need covid test prior.

## 2019-08-06 NOTE — Telephone Encounter (Signed)
Pt made aware Sonia Baller would follow up with her next week. Pt agreeable to plan.

## 2019-08-06 NOTE — Telephone Encounter (Signed)
Follow Up   Patient is returning call about loop recorder. Please give patient a call back.

## 2019-08-08 NOTE — Progress Notes (Signed)
Carelink Summary Report / Loop Recorder 

## 2019-08-11 ENCOUNTER — Other Ambulatory Visit: Payer: Self-pay

## 2019-08-11 ENCOUNTER — Emergency Department (HOSPITAL_COMMUNITY): Payer: Medicare Other

## 2019-08-11 ENCOUNTER — Inpatient Hospital Stay (HOSPITAL_COMMUNITY)
Admission: EM | Admit: 2019-08-11 | Discharge: 2019-08-12 | DRG: 041 | Disposition: A | Discharge status: Home / Self Care | Payer: Medicare Other | Attending: Internal Medicine | Admitting: Internal Medicine

## 2019-08-11 ENCOUNTER — Encounter (HOSPITAL_COMMUNITY): Payer: Self-pay | Admitting: Neurology

## 2019-08-11 ENCOUNTER — Telehealth: Payer: Self-pay | Admitting: Neurology

## 2019-08-11 DIAGNOSIS — Z20828 Contact with and (suspected) exposure to other viral communicable diseases: Secondary | ICD-10-CM | POA: Diagnosis not present

## 2019-08-11 DIAGNOSIS — G453 Amaurosis fugax: Secondary | ICD-10-CM | POA: Diagnosis present

## 2019-08-11 DIAGNOSIS — R482 Apraxia: Secondary | ICD-10-CM | POA: Diagnosis not present

## 2019-08-11 DIAGNOSIS — R531 Weakness: Secondary | ICD-10-CM | POA: Diagnosis not present

## 2019-08-11 DIAGNOSIS — I6340 Cerebral infarction due to embolism of unspecified cerebral artery: Principal | ICD-10-CM | POA: Diagnosis present

## 2019-08-11 DIAGNOSIS — D6859 Other primary thrombophilia: Secondary | ICD-10-CM | POA: Diagnosis present

## 2019-08-11 DIAGNOSIS — I63419 Cerebral infarction due to embolism of unspecified middle cerebral artery: Secondary | ICD-10-CM

## 2019-08-11 DIAGNOSIS — I10 Essential (primary) hypertension: Secondary | ICD-10-CM | POA: Diagnosis not present

## 2019-08-11 DIAGNOSIS — I639 Cerebral infarction, unspecified: Secondary | ICD-10-CM | POA: Diagnosis not present

## 2019-08-11 DIAGNOSIS — Z79899 Other long term (current) drug therapy: Secondary | ICD-10-CM

## 2019-08-11 DIAGNOSIS — R29700 NIHSS score 0: Secondary | ICD-10-CM | POA: Diagnosis present

## 2019-08-11 DIAGNOSIS — R410 Disorientation, unspecified: Secondary | ICD-10-CM | POA: Diagnosis not present

## 2019-08-11 DIAGNOSIS — E785 Hyperlipidemia, unspecified: Secondary | ICD-10-CM

## 2019-08-11 DIAGNOSIS — Z7951 Long term (current) use of inhaled steroids: Secondary | ICD-10-CM

## 2019-08-11 DIAGNOSIS — J329 Chronic sinusitis, unspecified: Secondary | ICD-10-CM | POA: Diagnosis present

## 2019-08-11 DIAGNOSIS — Z8673 Personal history of transient ischemic attack (TIA), and cerebral infarction without residual deficits: Secondary | ICD-10-CM

## 2019-08-11 DIAGNOSIS — Z8249 Family history of ischemic heart disease and other diseases of the circulatory system: Secondary | ICD-10-CM

## 2019-08-11 DIAGNOSIS — R29810 Facial weakness: Secondary | ICD-10-CM | POA: Diagnosis not present

## 2019-08-11 DIAGNOSIS — Z9013 Acquired absence of bilateral breasts and nipples: Secondary | ICD-10-CM

## 2019-08-11 DIAGNOSIS — Z7901 Long term (current) use of anticoagulants: Secondary | ICD-10-CM

## 2019-08-11 DIAGNOSIS — Z853 Personal history of malignant neoplasm of breast: Secondary | ICD-10-CM

## 2019-08-11 HISTORY — DX: Cerebral infarction, unspecified: I63.9

## 2019-08-11 LAB — COMPREHENSIVE METABOLIC PANEL
ALT: 19 U/L (ref 0–44)
AST: 21 U/L (ref 15–41)
Albumin: 4.2 g/dL (ref 3.5–5.0)
Alkaline Phosphatase: 75 U/L (ref 38–126)
Anion gap: 7 (ref 5–15)
BUN: 9 mg/dL (ref 8–23)
CO2: 27 mmol/L (ref 22–32)
Calcium: 9.2 mg/dL (ref 8.9–10.3)
Chloride: 108 mmol/L (ref 98–111)
Creatinine, Ser: 0.8 mg/dL (ref 0.44–1.00)
GFR calc Af Amer: >60 mL/min (ref >60)
GFR calc non Af Amer: >60 mL/min (ref >60)
Glucose, Bld: 99 mg/dL (ref 70–99)
Potassium: 3.7 mmol/L (ref 3.5–5.1)
Sodium: 142 mmol/L (ref 135–145)
Total Bilirubin: 0.8 mg/dL (ref 0.3–1.2)
Total Protein: 7.1 g/dL (ref 6.5–8.1)

## 2019-08-11 LAB — CBC
HCT: 37.5 % (ref 36.0–46.0)
Hemoglobin: 12 g/dL (ref 12.0–15.0)
MCH: 32.1 pg (ref 26.0–34.0)
MCHC: 32 g/dL (ref 30.0–36.0)
MCV: 100.3 fL — ABNORMAL HIGH (ref 80.0–100.0)
Platelets: 170 10*3/uL (ref 150–400)
RBC: 3.74 MIL/uL — ABNORMAL LOW (ref 3.87–5.11)
RDW: 12.2 % (ref 11.5–15.5)
WBC: 4.3 10*3/uL (ref 4.0–10.5)
nRBC: 0 % (ref 0.0–0.2)

## 2019-08-11 LAB — SARS CORONAVIRUS 2 (TAT 6-24 HRS): SARS Coronavirus 2: NEGATIVE

## 2019-08-11 LAB — I-STAT CHEM 8, ED
BUN: 10 mg/dL (ref 8–23)
Calcium, Ion: 1.23 mmol/L (ref 1.15–1.40)
Chloride: 107 mmol/L (ref 98–111)
Creatinine, Ser: 0.7 mg/dL (ref 0.44–1.00)
Glucose, Bld: 92 mg/dL (ref 70–99)
HCT: 37 % (ref 36.0–46.0)
Hemoglobin: 12.6 g/dL (ref 12.0–15.0)
Potassium: 3.4 mmol/L — ABNORMAL LOW (ref 3.5–5.1)
Sodium: 144 mmol/L (ref 135–145)
TCO2: 26 mmol/L (ref 22–32)

## 2019-08-11 LAB — DIFFERENTIAL
Abs Immature Granulocytes: 0 10*3/uL (ref 0.00–0.07)
Basophils Absolute: 0 10*3/uL (ref 0.0–0.1)
Basophils Relative: 0 %
Eosinophils Absolute: 0.1 10*3/uL (ref 0.0–0.5)
Eosinophils Relative: 3 %
Immature Granulocytes: 0 %
Lymphocytes Relative: 16 %
Lymphs Abs: 0.7 10*3/uL (ref 0.7–4.0)
Monocytes Absolute: 0.4 10*3/uL (ref 0.1–1.0)
Monocytes Relative: 9 %
Neutro Abs: 3.1 10*3/uL (ref 1.7–7.7)
Neutrophils Relative %: 72 %

## 2019-08-11 LAB — PROTIME-INR
INR: 1 (ref 0.8–1.2)
Prothrombin Time: 12.7 seconds (ref 11.4–15.2)

## 2019-08-11 LAB — APTT: aPTT: 32 seconds (ref 24–36)

## 2019-08-11 MED ORDER — ASPIRIN EC 81 MG PO TBEC: 81.0000 mg | DELAYED_RELEASE_TABLET | Freq: Every day | ORAL | Status: DC

## 2019-08-11 MED ORDER — BIMATOPROST 0.03 % EX SOLN: 1.0000 "application " | Freq: Every day | CUTANEOUS | Status: DC

## 2019-08-11 MED ORDER — CLOPIDOGREL BISULFATE 75 MG PO TABS
75.0000 mg | ORAL_TABLET | Freq: Every day | ORAL | Status: DC
  Administered 2019-08-12: 75 mg via ORAL
  Filled 2019-08-11: qty 1

## 2019-08-11 MED ORDER — SENNOSIDES-DOCUSATE SODIUM 8.6-50 MG PO TABS: 1.0000 | ORAL_TABLET | Freq: Every evening | ORAL | Status: DC | PRN

## 2019-08-11 MED ORDER — ATORVASTATIN CALCIUM 40 MG PO TABS: 40.0000 mg | ORAL_TABLET | Freq: Every day | ORAL | Status: DC

## 2019-08-11 MED ORDER — FLUTICASONE PROPIONATE 50 MCG/ACT NA SUSP
1.0000 | Freq: Two times a day (BID) | NASAL | Status: DC
  Administered 2019-08-11 – 2019-08-12 (×2): 1 via NASAL
  Filled 2019-08-11: qty 16

## 2019-08-11 MED ORDER — ACETAMINOPHEN 650 MG RE SUPP: 650.0000 mg | RECTAL | Status: DC | PRN

## 2019-08-11 MED ORDER — ZOLPIDEM TARTRATE 5 MG PO TABS
5.0000 mg | ORAL_TABLET | Freq: Every evening | ORAL | Status: DC | PRN
  Administered 2019-08-11: 5 mg via ORAL
  Filled 2019-08-11: qty 1

## 2019-08-11 MED ORDER — ENOXAPARIN SODIUM 40 MG/0.4ML ~~LOC~~ SOLN
40.0000 mg | SUBCUTANEOUS | Status: DC
  Administered 2019-08-11: 40 mg via SUBCUTANEOUS
  Filled 2019-08-11: qty 0.4

## 2019-08-11 MED ORDER — AMLODIPINE BESYLATE 5 MG PO TABS
5.0000 mg | ORAL_TABLET | Freq: Every day | ORAL | Status: DC
  Administered 2019-08-12: 11:00:00 5 mg via ORAL
  Filled 2019-08-11: qty 1

## 2019-08-11 MED ORDER — STROKE: EARLY STAGES OF RECOVERY BOOK
Freq: Once | Status: AC
  Administered 2019-08-11: 22:00:00
  Filled 2019-08-11: qty 1

## 2019-08-11 MED ORDER — LATANOPROST 0.005 % OP SOLN
1.0000 [drp] | Freq: Every day | OPHTHALMIC | Status: DC
  Administered 2019-08-11: 1 [drp] via OPHTHALMIC
  Filled 2019-08-11: qty 2.5

## 2019-08-11 MED ORDER — ACETAMINOPHEN 160 MG/5ML PO SOLN: 650.0000 mg | ORAL | Status: DC | PRN

## 2019-08-11 MED ORDER — SODIUM CHLORIDE 0.9% FLUSH
3.0000 mL | Freq: Once | INTRAVENOUS | Status: AC
  Administered 2019-08-11: 3 mL via INTRAVENOUS

## 2019-08-11 MED ORDER — ACETAMINOPHEN 325 MG PO TABS: 650.0000 mg | ORAL_TABLET | ORAL | Status: DC | PRN

## 2019-08-11 MED ORDER — ASPIRIN EC 81 MG PO TBEC
81.0000 mg | DELAYED_RELEASE_TABLET | Freq: Every day | ORAL | Status: DC
  Administered 2019-08-11 – 2019-08-12 (×2): 81 mg via ORAL
  Filled 2019-08-11 (×4): qty 1

## 2019-08-11 NOTE — ED Provider Notes (Signed)
Care assumed from Dr. Lodema Hong at 3 PM.  Patient presents with task difficulty/AMS.  Neurology consulted.  MRI was pending.  She had recent stroke and MRI today has redemonstrated a new right parietal ischemic stroke.  Neurology to reassess and expect admission.  Hemodynamically stable.  Patient and family have been updated by Dr. Lodema Hong.  Update: Hospitalist admitted for further work-up and treatment.  Neurology will continue to follow.  Patient remained hemodynamically stable while in the emergency department.  No other interventions while under my care.     Tillie Fantasia, MD 08/11/19 2321    Margette Fast, MD 08/12/19 1006

## 2019-08-11 NOTE — ED Triage Notes (Signed)
Onset last night around 8pm pt got back from jog, got to front door and had difficulties remembering how to open door, then difficulties with flushing toilet, unlocking phone.  Pt had headache at base of neck prior to going to sleep.   Pt reports she is "foggy".  Recent stroke 2 months ago with visual problems no residual vision problems. Pt called neurologjst this morning,on call told her to come to ED.  Pt reports she had difficult time callilng 911.   A&Ox4

## 2019-08-11 NOTE — Telephone Encounter (Signed)
Connie Meyer, She called answering service Monday morning.  Last night she did a little jog.  When she got home she noted difficulty doing simple tasks like opening door and flushing toilet --- sounded more like apraxia than ataxia or weakness  She did not note numbness or weakness or definite ataxia.  The apraxia with doing tasks involving hands is continuing this morning.     With symptoms persisting, she is advised to go to the ED as she could have a stroke causing an apraxia and needs evaluation and  imaging

## 2019-08-11 NOTE — Telephone Encounter (Signed)
Agree with recommendation.  Thank you for the update.

## 2019-08-11 NOTE — ED Notes (Signed)
ED TO INPATIENT HANDOFF REPORT  ED Nurse Name and Phone #: Corben Auzenne   S Name/Age/Gender Connie Meyer 68 y.o. female Room/Bed: 021C/021C  Code Status   Code Status: Prior  Home/SNF/Other Home Patient oriented to: self, place, time and situation Is this baseline? Yes   Triage Complete: Triage complete  Chief Complaint confusion  Triage Note Onset last night around 8pm pt got back from jog, got to front door and had difficulties remembering how to open door, then difficulties with flushing toilet, unlocking phone.  Pt had headache at base of neck prior to going to sleep.   Pt reports she is "foggy".  Recent stroke 2 months ago with visual problems no residual vision problems. Pt called neurologjst this morning,on call told her to come to ED.  Pt reports she had difficult time callilng 911.   A&Ox4    Allergies No Known Allergies  Level of Care/Admitting Diagnosis ED Disposition    ED Disposition Condition [facility]: [place] [100100]  Level of Care: Telemetry Medical [104]  Covid Evaluation: Asymptomatic Screening Protocol (No Symptoms)  Diagnosis: CVA (cerebral vascular accident) [facility]) [[id number]]  Admitting Physician: Merton Border Marshal.[place]  Attending Physician: Laren Everts, [place]  Estimated length of stay: past midnight tomorrow  Certification:: I certify this patient will need inpatient services for at least 2 midnights  PT Class (Do Not Modify): Inpatient [101]  PT Acc Code (Do Not Modify): Private [1]       B Medical/Surgery History Past Medical History:  Diagnosis Date  . Cancer ([place])    bilateral breast  . HTN (hypertension)   . Stroke [facility])    Past Surgical History:  Procedure Laterality Date  . BREAST SURGERY    . BUBBLE STUDY  06/16/2019   Procedure: BUBBLE STUDY;  Surgeon: Fay Records, MD;  Location: [facility] ENDOSCOPY;  Service: Cardiovascular;;  . LOOP RECORDER INSERTION N/A 06/16/2019   Procedure: LOOP  RECORDER INSERTION;  Surgeon: Evans Lance, MD;  Location: [facility] CV LAB;  Service: Cardiovascular;  Laterality: N/A;  . TEE WITHOUT CARDIOVERSION N/A 06/16/2019   Procedure: TRANSESOPHAGEAL ECHOCARDIOGRAM (TEE);  Surgeon: Fay Records, MD;  Location: [facility] ENDOSCOPY;  Service: Cardiovascular;  Laterality: N/A;     A IV Location/Drains/Wounds Patient Lines/Drains/Airways Status   Active Line/Drains/Airways    Name:   Placement date:   Placement time:   Site:   Days:   Peripheral IV 08/11/19 Left Antecubital   08/11/19    0947    Antecubital   less than 1          Intake/Output Last 24 hours No intake or output data in the 24 hours ending 08/11/19 1617  Labs/Imaging Results for orders placed or performed during the hospital encounter of 08/11/19 (from the past 48 hour(s))  Protime-INR     Status: None   Collection Time: 08/11/19  9:49 AM  Result Value Ref Range   Prothrombin Time 12.7 11.4 - 15.2 seconds   INR 1.0 0.8 - 1.2    Comment: (NOTE) INR goal varies based on device and disease states. Performed at [facility], [place] [address], [place], [place] [address]   APTT     Status: None   Collection Time: 08/11/19  9:49 AM  Result Value Ref Range   aPTT 32 24 - 36 seconds    Comment: Performed at [place] [address] [place], [place] [address]  CBC  Status: Abnormal   Collection Time: 08/11/19  9:49 AM  Result Value Ref Range   WBC 4.3 4.0 - 10.5 K/uL   RBC 3.74 (L) 3.87 - 5.11 MIL/uL   Hemoglobin 12.0 12.0 - 15.0 g/dL   HCT 37.5 36.0 - 46.0 %   MCV 100.3 (H) 80.0 - 100.0 fL   MCH 32.1 26.0 - 34.0 pg   MCHC 32.0 30.0 - 36.0 g/dL   RDW 12.2 11.5 - 15.5 %   Platelets 170 150 - 400 K/uL   nRBC 0.0 0.0 - 0.2 %    Comment: Performed at [facility], [place] [address] [place], [place] [address]  Differential     Status: None   Collection Time: 08/11/19  9:49 AM  Result Value Ref Range   Neutrophils Relative % 72 %   Neutro Abs 3.1 1.7 -  7.7 K/uL   Lymphocytes Relative 16 %   Lymphs Abs 0.7 0.7 - 4.0 K/uL   Monocytes Relative 9 %   Monocytes Absolute 0.4 0.1 - 1.0 K/uL   Eosinophils Relative 3 %   Eosinophils Absolute 0.1 0.0 - 0.5 K/uL   Basophils Relative 0 %   Basophils Absolute 0.0 0.0 - 0.1 K/uL   Immature Granulocytes 0 %   Abs Immature Granulocytes 0.00 0.00 - 0.07 K/uL    Comment: Performed at [facility], [place] [address] [place], [place] [address]  Comprehensive metabolic panel     Status: None   Collection Time: 08/11/19  9:49 AM  Result Value Ref Range   Sodium 142 135 - 145 mmol/L   Potassium 3.7 3.5 - 5.1 mmol/L   Chloride 108 98 - 111 mmol/L   CO2 27 22 - 32 mmol/L   Glucose, Bld 99 70 - 99 mg/dL   BUN 9 8 - 23 mg/dL   Creatinine, Ser 0.80 0.44 - 1.00 mg/dL   Calcium 9.2 8.9 - 10.3 mg/dL   Total Protein 7.1 6.5 - 8.1 g/dL   Albumin 4.2 3.5 - 5.0 g/dL   AST 21 15 - 41 U/L   ALT 19 0 - 44 U/L   Alkaline Phosphatase 75 38 - 126 U/L   Total Bilirubin 0.8 0.3 - 1.2 mg/dL   GFR calc non Af Amer >60 >60 mL/min   GFR calc Af Amer >60 >60 mL/min   Anion gap 7 5 - 15    Comment: Performed at [facility] [address] [place], [place] [address]  I-stat chem 8, ED     Status: Abnormal   Collection Time: 08/11/19  9:57 AM  Result Value Ref Range   Sodium 144 135 - 145 mmol/L   Potassium 3.4 (L) 3.5 - 5.1 mmol/L   Chloride 107 98 - 111 mmol/L   BUN 10 8 - 23 mg/dL   Creatinine, Ser 0.70 0.44 - 1.00 mg/dL   Glucose, Bld 92 70 - 99 mg/dL   Calcium, Ion 1.23 1.15 - 1.40 mmol/L   TCO2 26 22 - 32 mmol/L   Hemoglobin 12.6 12.0 - 15.0 g/dL   HCT 37.0 36.0 - 46.0 %   Ct Head Wo Contrast  Result Date: 08/11/2019 CLINICAL DATA:  Difficulty remembering how to do simple tasks, history stroke, hypertension, breast cancer EXAM: CT HEAD WITHOUT CONTRAST TECHNIQUE: Contiguous axial images were obtained from the base of the skull through the vertex without intravenous contrast. COMPARISON:  None  Correlation: CT head 06/14/2019 FINDINGS: Brain: Normal ventricular morphology. No midline  shift or mass effect. Mild small vessel chronic ischemic changes of deep cerebral white matter. Small old deep infarct in LEFT cerebellar hemisphere. Question tiny lacunar infarct in RIGHT basal ganglia. No intracranial hemorrhage, mass lesion, or evidence of additional infarction. No extra-axial fluid collections. Vascular: No hyperdense vessels Skull: Intact Sinuses/Orbits: Clear Other: N/A IMPRESSION: Mild small vessel chronic ischemic changes of deep cerebral white matter. Question tiny age indeterminate lacunar infarct RIGHT basal ganglia. Small old LEFT cerebellar infarct. No intracranial hemorrhage. Electronically Signed   By: Lavonia Dana M.D.   On: 08/11/2019 10:59   Mr Brain Wo Contrast  Result Date: 08/11/2019 CLINICAL DATA:  Transient difficulty with tasks EXAM: MRI HEAD WITHOUT CONTRAST TECHNIQUE: Multiplanar, multiecho pulse sequences of the brain and surrounding structures were obtained without intravenous contrast. COMPARISON:  CT head 08/11/2019.  MRI head 06/13/2019 FINDINGS: Brain: Scattered small cortical subcortical infarcts in the right parietal lobe. Chronic microvascular ischemic change in the white matter, similar to the prior MRI. Ventricle size normal. Negative for hemorrhage or mass. Vascular: Normal arterial flow voids. Skull and upper cervical spine: Negative Sinuses/Orbits: Mild mucosal edema paranasal sinuses. Bilateral mastoid effusion. Bilateral cataract surgery Other: None IMPRESSION: Scattered small cortical and subcortical infarcts in the right parietal lobe. Negative for hemorrhage Mild chronic microvascular ischemic change in the white matter stable since the prior MRI. Prior MRI demonstrated a small acute infarct left occipital lobe which has resolved. Consider cerebral emboli. Electronically Signed   By: Franchot Gallo M.D.   On: 08/11/2019 13:55    Pending Labs Unresulted Labs  (From admission, onward)    Start     Ordered   08/11/19 1556  SARS CORONAVIRUS 2 Nasal Swab Aptima Multi Swab  (Asymptomatic/Tier 2)  Once,   STAT    Question Answer Comment  Is this test for diagnosis or screening Screening   Symptomatic for COVID-19 as defined by CDC No   Hospitalized for COVID-19 No   Admitted to ICU for COVID-19 No   Previously tested for COVID-19 Yes   Resident in a congregate (group) care setting No   Employed in healthcare setting No   Pregnant No      08/11/19 1555   Signed and Held  Hemoglobin A1c  Tomorrow morning,   R     Signed and Held   Signed and Held  Lipid panel  Tomorrow morning,   R    Comments: Fasting    Signed and Held          Vitals/Pain Today's Vitals   08/11/19 1200 08/11/19 1415 08/11/19 1515 08/11/19 1545  BP: (!) 146/76 (!) 145/76 (!) 141/71 134/68  Pulse: 69 78 69 69  Resp: 20 (!) 21 14 20   Temp:      TempSrc:      SpO2: 97% 100% 100% 100%  PainSc:        Isolation Precautions No active isolations  Medications Medications  sodium chloride flush (NS) 0.9 % injection 3 mL (3 mLs Intravenous Given 08/11/19 0948)    Mobility walks Low fall risk   Focused Assessments Neuro Assessment Handoff:  Swallow screen pass? Yes    NIH Stroke Scale ( + Modified Stroke Scale Criteria)  Interval: Initial Level of Consciousness (1a.)   : Alert, keenly responsive LOC Questions (1b. )   +: Answers both questions correctly LOC Commands (1c. )   + : Performs both tasks correctly Best Gaze (2. )  +: Normal Visual (3. )  +: No visual  loss Facial Palsy (4. )    : Normal symmetrical movements Motor Arm, Left (5a. )   +: No drift Motor Arm, Right (5b. )   +: No drift Motor Leg, Left (6a. )   +: No drift Motor Leg, Right (6b. )   +: No drift Limb Ataxia (7. ): Absent Sensory (8. )   +: Normal, no sensory loss Best Language (9. )   +: No aphasia Dysarthria (10. ): Normal Extinction/Inattention (11.)   +: No Abnormality Modified  SS Total  +: 0 Complete NIHSS TOTAL: 0 Last date known well: 08/10/19 Last time known well: 2000 Neuro Assessment: Exceptions to [facility] Neuro Checks:   Initial (08/11/19 0950)  Last Documented NIHSS Modified Score: 0 (08/11/19 0950) Has TPA been given? No If patient is a Neuro Trauma and patient is going to OR before floor call report to [place] nurse: [phone] or [phone]     R Recommendations: See Admitting Provider Note  Report given to:   Additional Notes:

## 2019-08-11 NOTE — Consult Note (Addendum)
Neurology Consultation  Reason for Consult: Transient difficulty with tasks Referring Physician: Vanita Panda  CC: Transient difficulty with tasks  History is obtained from: Patient  HPI: Connie Meyer is a 68 y.o. female with history of cryptogenic stroke, hypertension, bilateral breast cancer.  Patient states that approximately 2200 hrs. last night after she had gone for a run, she noted that she was unable to open the door.  She states that she knew what she wanted to do and had the capabilities of doing it however could not put the 2 tasks together.  She eventually got inside and attempted to call 911.  Again she had the phone in her hand, but could not get her thought process dialed 911.  Patient did not go to the hospital at that time hoping that the symptoms would resolve.  This morning she was very anxious as she had just recently recovered from a stroke and thus called [facility] on call number.  She was told to come to the hospital.  At this time she feels back to her baseline.  She states she is in the [place] trial and has been taking the medications as directed.   ED course patient received labs, CT scan of head and to be obtained his MRI brain   Chart review (patient was recently seen at the hospital on 06/14/2019 for consult concerning amaurosis fugax in left and right eyes.  At that time MRI showed small group of punctate acute/early subacute infarcts in the left occipital lobe.  TEE showed an EF of 55-60%.  Bilateral carotid Doppler showed no bilateral stenosis.  LDL was 107, A1c was 54.  At that time it was recommended that she be placed on aspirin and Plavix for 3 weeks followed by aspirin alone.  The patient at that point was placed also on the [place] trial.  It was also recommended that patient get a loop recorder which was placed on 06/16/2019.)  Work up that has been done: As above on 06/14/2019.  CMP, CBC, APTT, CBG, and CT scan   LKW: 2000 hrs. on  08/10/2019 tpa given?: no, symptoms have resolved and also recent stroke Premorbid modified Rankin scale (mRS): 0 NIH stroke scale of 0    ROS: A 14 point ROS was performed and is negative except as noted in the HPI.   Past Medical History:  Diagnosis Date  . Cancer ([place])    bilateral breast  . HTN (hypertension)   . Stroke [facility])      Family History  Problem Relation Age of Onset  . Hypertension Mother   . Hypertension Father     Social History:   reports that she has never smoked. She has never used smokeless tobacco. She reports current alcohol use of about 1.0 standard drinks of alcohol per week. She reports that she does not use drugs.  Medications No current facility-administered medications for this encounter.   Current Outpatient Medications:  .  amLODipine (NORVASC) 5 MG tablet, Take 5 mg by mouth daily., Disp: , Rfl:  .  atorvastatin (LIPITOR) 40 MG tablet, Take 1 tablet (40 mg total) by mouth daily at 6 PM., Disp: 30 tablet, Rfl: 1 .  bimatoprost (LATISSE) 0.03 % ophthalmic solution, 1 application daily., Disp: , Rfl:  .  fluticasone (FLONASE) 50 MCG/ACT nasal spray, Place 1 spray into both nostrils 2 (two) times daily., Disp: 16 g, Rfl: 6 .  STUDY - ARCADIA - apixaban 2.71m or placebo (PI - Sethi), Take 2.5  mg by mouth 2 (two) times daily., Disp: , Rfl:  .  aspirin EC 81 MG EC tablet, Take 1 tablet (81 mg total) by mouth daily. (Patient not taking: Reported on 07/23/2019), Disp: 30 tablet, Rfl: 0   Exam: Current vital signs: BP 137/72   Pulse 78   Temp 98.4 F (36.9 C) (Oral)   Resp 13   SpO2 100%  Vital signs in last 24 hours: Temp:  [98.4 F (36.9 C)] 98.4 F (36.9 C) (08/17 0919) Pulse Rate:  [70-78] 78 (08/17 1149) Resp:  [13-18] 13 (08/17 1149) BP: (124-160)/(71-90) 137/72 (08/17 1149) SpO2:  [98 %-100 %] 100 % (08/17 1149)  Physical Exam  Constitutional: Appears well-developed and well-nourished.  Psych: Affect appropriate to situation Eyes:  No scleral injection HENT: No OP obstrucion Head: Normocephalic.  Cardiovascular: Normal rate and regular rhythm.  Respiratory: Effort normal, non-labored breathing GI: Soft.  No distension. There is no tenderness.  Skin: WDI  Neuro: Mental Status: Patient is awake, alert, oriented to person, place, month, year, and situation. Patient is able to give a clear and coherent history. No signs of aphasia or neglect Cranial Nerves: II: Visual Fields are full.  III,IV, VI: EOMI without ptosis or diploplia. Pupils equal, round and reactive to light V: Facial sensation is symmetric to temperature VII: Facial movement is symmetric.  VIII: hearing is intact to voice X: Palat elevates symmetrically XI: Shoulder shrug is symmetric. XII: tongue is midline without atrophy or fasciculations.  Motor: Tone is normal. Bulk is normal. 5/5 strength was present in all four extremities.  Sensory: Sensation is symmetric to light touch and temperature in the arms and legs. Deep Tendon Reflexes: 1+ and symmetric in the biceps and patellae.  Plantars: Toes are downgoing bilaterally.  Cerebellar: FNF and HKS are intact bilaterally  Labs I have reviewed labs in epic and the results pertinent to this consultation are:   CBC    Component Value Date/Time   WBC 4.3 08/11/2019 0949   RBC 3.74 (L) 08/11/2019 0949   HGB 12.6 08/11/2019 0957   HGB 12.5 07/07/2019 1040   HGB 11.2 (L) 07/08/2007 1446   HCT 37.0 08/11/2019 0957   HCT 37.1 07/07/2019 1040   HCT 33.4 (L) 07/08/2007 1446   PLT 170 08/11/2019 0949   PLT 212 07/07/2019 1040   MCV 100.3 (H) 08/11/2019 0949   MCV 94 07/07/2019 1040   MCV 97.3 07/08/2007 1446   MCH 32.1 08/11/2019 0949   MCHC 32.0 08/11/2019 0949   RDW 12.2 08/11/2019 0949   RDW 11.6 (L) 07/07/2019 1040   RDW 14.8 (H) 07/08/2007 1446   LYMPHSABS 0.7 08/11/2019 0949   LYMPHSABS 1.2 02/26/2018 1354   LYMPHSABS 1.0 07/08/2007 1446   MONOABS 0.4 08/11/2019 0949   MONOABS  0.3 07/08/2007 1446   EOSABS 0.1 08/11/2019 0949   EOSABS 0.3 02/26/2018 1354   BASOSABS 0.0 08/11/2019 0949   BASOSABS 0.0 02/26/2018 1354   BASOSABS 0.0 07/08/2007 1446    CMP     Component Value Date/Time   NA 144 08/11/2019 0957   NA 141 07/07/2019 1040   K 3.4 (L) 08/11/2019 0957   CL 107 08/11/2019 0957   CO2 27 08/11/2019 0949   GLUCOSE 92 08/11/2019 0957   BUN 10 08/11/2019 0957   BUN 12 07/07/2019 1040   CREATININE 0.70 08/11/2019 0957   CALCIUM 9.2 08/11/2019 0949   PROT 7.1 08/11/2019 0949   PROT 7.1 07/07/2019 1040   ALBUMIN 4.2 08/11/2019 0949  ALBUMIN 4.6 07/07/2019 1040   AST 21 08/11/2019 0949   ALT 19 08/11/2019 0949   ALKPHOS 75 08/11/2019 0949   BILITOT 0.8 08/11/2019 0949   BILITOT 0.5 07/07/2019 1040   GFRNONAA >60 08/11/2019 0949   GFRAA >60 08/11/2019 0949    Lipid Panel     Component Value Date/Time   CHOL 184 06/14/2019 0300   CHOL 176 02/17/2019 1640   TRIG 67 06/14/2019 0300   HDL 64 06/14/2019 0300   HDL 62 02/17/2019 1640   CHOLHDL 2.9 06/14/2019 0300   VLDL 13 06/14/2019 0300   LDLCALC 107 (H) 06/14/2019 0300   LDLCALC 100 (H) 02/17/2019 1640     Imaging I have reviewed the images obtained:  CT-scan of the brain-mild small vessel chronic ischemic changes of deep cerebral white matter.  Question tiny age-indeterminate lacunar infarct on the right basal ganglia.  Small old left cerebellar infarct.  MRI examination of the brain-to be obtained  Etta Quill PA-C [facility] [phone]  M-F  (9:00 am- 5:00 PM)  08/11/2019, 12:31 PM   ASSESSMENT AND PLAN   68 y.o. female with history of cryptogenic stroke, hypertension, bilateral breast cancer presents with sudden onset motor apraxia that began at 8 PM yesterday.  MRI brain reveals multiple small infarcts in the right posterior parietal lobe consistent with patient's presentation. Patient was on [place] trial and was randomized to receive aspirin versus Eliquis for  cryptogenic stroke. (ARCADIA : AtRial Cardiopathy and Antithrombotic Drugs In Prevention After Cryptogenic Stroke ).  As the patient has had a recurrent stroke, she has met her primary endpoint and we will need to discontinue her study drug.   Acute Ischemic Stroke   Risk factors: Bilateral breast cancer, hypertension, previous stroke Etiology: Embolic stroke of unknown source (ESUS)  Recommend # repeat transthoracic Echo # D/C study drug #Start patient on aspirin 81 mg and Plavix 75 mg daily, will need to have detailed conversation regarding risk versus benefit of starting anticoagulation #Start or continue Atorvastatin 40 mg/other high intensity statin # BP goal: permissive HTN upto 185/110 mmHg # HBAIC and Lipid profile # Telemetry monitoring # Frequent neuro checks #  stroke swallow screen  Please page stroke NP  Or  PA  Or MD from 8am -4 pm  as this patient from this time will be  followed by the stroke.   You can look them up on [email or web address]  Password [email or web address]

## 2019-08-11 NOTE — ED Notes (Signed)
Dinner Tray Ordered @ 1557-per [employer], RN called by Levada Dy

## 2019-08-11 NOTE — ED Notes (Signed)
Pt found walking up and down the hallway across the department from her room, pt states she is just trying to get warm. Redirected to her room

## 2019-08-11 NOTE — H&P (Signed)
[facility]                                                                                    Patient Demographics  Connie Meyer, is a 68 y.o. female  CSN: [id number]  MRN: [id number]  DOB - [birth year]  Admit Date - 08/11/2019  Outpatient Primary MD for the patient is [name], MD   With History of -  Past Medical History:  Diagnosis Date  . Cancer ([employer])    bilateral breast  . HTN (hypertension)   . Stroke [facility])       Past Surgical History:  Procedure Laterality Date  . BREAST SURGERY    . BUBBLE STUDY  06/16/2019   Procedure: BUBBLE STUDY;  Surgeon: Fay Records, MD;  Location: [facility] ENDOSCOPY;  Service: Cardiovascular;;  . LOOP RECORDER INSERTION N/A 06/16/2019   Procedure: LOOP RECORDER INSERTION;  Surgeon: Evans Lance, MD;  Location: [place] CV LAB;  Service: Cardiovascular;  Laterality: N/A;  . TEE WITHOUT CARDIOVERSION N/A 06/16/2019   Procedure: TRANSESOPHAGEAL ECHOCARDIOGRAM (TEE);  Surgeon: Fay Records, MD;  Location: [facility] ENDOSCOPY;  Service: Cardiovascular;  Laterality: N/A;    in for   Chief Complaint  Patient presents with  . Altered Mental Status     HPI  Connie Meyer  is a 68 y.o. 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  Patient recently recovered from a stroke however for the last 2 days she has been noticing some abnormalities with regular activities of daily living.  Yesterday she was unable to open the door although she knew however she could not put the 2 tasks together.  She was unable to use her phone.  Patient became very anxious and came to the emergency room. Work-up in the emergency room showed small cortical and subcortical infarcts in the right parietal lobe.  CT of the head showed questionable acute basal ganglia infarct as well.  Blood work was  unremarkable.   Review of Systems    In addition to the HPI above,  No Fever-chills, No Headache, No changes with Vision or hearing, No problems swallowing food or Liquids, No Chest pain, Cough or Shortness of Breath, No Abdominal pain, No Nausea or Vommitting, Bowel movements are regular, No Blood in stool or Urine, No dysuria, No new skin rashes or bruises, No new joints pains-aches,  No recent weight gain or loss, No polyuria, polydypsia or polyphagia, No significant Mental Stressors. Her review of system was positive only for difficulties with ADLs   Social History Social History   Tobacco Use  . Smoking status: Never Smoker  . Smokeless tobacco: Never Used  Substance Use Topics  . Alcohol use: Yes    Alcohol/week: 1.0 standard drinks    Types: 1 Glasses of wine per week     Family History Family History  Problem Relation Age of Onset  . Hypertension Mother   . Hypertension Father      Prior to Admission medications   Medication Sig Start Date End Date  Taking? Authorizing Provider  amLODipine (NORVASC) 5 MG tablet Take 5 mg by mouth daily.   Yes [provider]  atorvastatin (LIPITOR) 40 MG tablet Take 1 tablet (40 mg total) by mouth daily at 6 PM. 06/16/19  Yes Vann, Jessica U, DO  bimatoprost (LATISSE) 0.03 % ophthalmic solution 1 application daily. 07/25/19  Yes [provider]  fluticasone (FLONASE) 50 MCG/ACT nasal spray Place 1 spray into both nostrils 2 (two) times daily. 07/07/19  Yes [name], MD  STUDY - [place] - apixaban 2.5mg  or placebo (PI - Sethi) Take 2.5 mg by mouth 2 (two) times daily.   Yes [provider]  aspirin EC 81 MG EC tablet Take 1 tablet (81 mg total) by mouth daily. Patient not taking: Reported on 07/23/2019 06/17/19   Geradine Girt, DO    No Known Allergies  Physical Exam  Vitals  Blood pressure 134/68, pulse 69, temperature 98.4 F (36.9 C), temperature source Oral, resp. rate 20, SpO2 100  %.  General appearance well-developed, well kempt female, looks anxious.  She was                           looking at her cell phone, unable to send a message to her daughter. HEENT no jaundice or pallor, no facial deviation no oral thrush Neck supple, no neck vein distention Chest clear and resonant Heart normal S1-S2, no murmurs gallops or rubs Abdomen soft, nontender, bowel sounds are present Extremities no clubbing cyanosis or edema Neuro grossly nonfocal, patient has normal motor power and sensations Skin no rashes or ulcers Psych not homicidal or suicidal    Data Review  CBC Recent Labs  Lab 08/11/19 0949 08/11/19 0957  WBC 4.3  --   HGB 12.0 12.6  HCT 37.5 37.0  PLT 170  --   MCV 100.3*  --   MCH 32.1  --   MCHC 32.0  --   RDW 12.2  --   LYMPHSABS 0.7  --   MONOABS 0.4  --   EOSABS 0.1  --   BASOSABS 0.0  --    ------------------------------------------------------------------------------------------------------------------  Chemistries  Recent Labs  Lab 08/11/19 0949 08/11/19 0957  NA 142 144  K 3.7 3.4*  CL 108 107  CO2 27  --   GLUCOSE 99 92  BUN 9 10  CREATININE 0.80 0.70  CALCIUM 9.2  --   AST 21  --   ALT 19  --   ALKPHOS 75  --   BILITOT 0.8  --    ------------------------------------------------------------------------------------------------------------------ CrCl cannot be calculated (Unknown ideal weight.). ------------------------------------------------------------------------------------------------------------------ No results for input(s): TSH, T4TOTAL, T3FREE, THYROIDAB in the last 72 hours.  Invalid input(s): FREET3   Coagulation profile Recent Labs  Lab 08/11/19 0949  INR 1.0   ------------------------------------------------------------------------------------------------------------------- No results for input(s): DDIMER in the last 72  hours. -------------------------------------------------------------------------------------------------------------------  Cardiac Enzymes No results for input(s): CKMB, TROPONINI, MYOGLOBIN in the last 168 hours.  Invalid input(s): CK ------------------------------------------------------------------------------------------------------------------ Invalid input(s): POCBNP   ---------------------------------------------------------------------------------------------------------------  Urinalysis    Component Value Date/Time   COLORURINE YELLOW 07/18/2017 [place] 07/18/2017 1047   LABSPEC 1.013 07/18/2017 1047   PHURINE 6.0 07/18/2017 1047   GLUCOSEU NEGATIVE 07/18/2017 1047   HGBUR SMALL (A) 07/18/2017 1047   BILIRUBINUR NEGATIVE 07/18/2017 1047   KETONESUR 5 (A) 07/18/2017 1047   PROTEINUR NEGATIVE 07/18/2017 1047   NITRITE NEGATIVE 07/18/2017 1047   LEUKOCYTESUR NEGATIVE  07/18/2017 1047    ----------------------------------------------------------------------------------------------------------------   Imaging results:   Dg Chest 2 View  Result Date: 07/16/2019 CLINICAL DATA:  Assess loop recorder location. EXAM: CHEST - 2 VIEW COMPARISON:  August 30, 2016 FINDINGS: Scarring is seen in the apices. Haziness over the bases consistent with overlapping soft tissues. No nodules or masses. No focal infiltrates. Surgical clips are seen bilaterally, stable. No loop recorder is identified. IMPRESSION: No loop recorder is identified. Electronically Signed   By: Dorise Bullion III M.D   On: 07/16/2019 09:37   Ct Head Wo Contrast  Result Date: 08/11/2019 CLINICAL DATA:  Difficulty remembering how to do simple tasks, history stroke, hypertension, breast cancer EXAM: CT HEAD WITHOUT CONTRAST TECHNIQUE: Contiguous axial images were obtained from the base of the skull through the vertex without intravenous contrast. COMPARISON:  None Correlation: CT head 06/14/2019  FINDINGS: Brain: Normal ventricular morphology. No midline shift or mass effect. Mild small vessel chronic ischemic changes of deep cerebral white matter. Small old deep infarct in LEFT cerebellar hemisphere. Question tiny lacunar infarct in RIGHT basal ganglia. No intracranial hemorrhage, mass lesion, or evidence of additional infarction. No extra-axial fluid collections. Vascular: No hyperdense vessels Skull: Intact Sinuses/Orbits: Clear Other: N/A IMPRESSION: Mild small vessel chronic ischemic changes of deep cerebral white matter. Question tiny age indeterminate lacunar infarct RIGHT basal ganglia. Small old LEFT cerebellar infarct. No intracranial hemorrhage. Electronically Signed   By: Lavonia Dana M.D.   On: 08/11/2019 10:59   Mr Brain Wo Contrast  Result Date: 08/11/2019 CLINICAL DATA:  Transient difficulty with tasks EXAM: MRI HEAD WITHOUT CONTRAST TECHNIQUE: Multiplanar, multiecho pulse sequences of the brain and surrounding structures were obtained without intravenous contrast. COMPARISON:  CT head 08/11/2019.  MRI head 06/13/2019 FINDINGS: Brain: Scattered small cortical subcortical infarcts in the right parietal lobe. Chronic microvascular ischemic change in the white matter, similar to the prior MRI. Ventricle size normal. Negative for hemorrhage or mass. Vascular: Normal arterial flow voids. Skull and upper cervical spine: Negative Sinuses/Orbits: Mild mucosal edema paranasal sinuses. Bilateral mastoid effusion. Bilateral cataract surgery Other: None IMPRESSION: Scattered small cortical and subcortical infarcts in the right parietal lobe. Negative for hemorrhage Mild chronic microvascular ischemic change in the white matter stable since the prior MRI. Prior MRI demonstrated a small acute infarct left occipital lobe which has resolved. Consider cerebral emboli. Electronically Signed   By: Franchot Gallo M.D.   On: 08/11/2019 13:55    My personal review of EKG: Rhythm NSR, Rate 71 bpm with no  acute changes    Assessment & Plan  CVA/acute-right parietal , with mixed psychomotor dysfunction Admit to telemetry Neurology is following, patient is part of [place] trial Continue with aspirin/statins Neurochecks  Hypertension Blood pressure stable  History of bilateral breast cancer Status post surgery    DVT Prophylaxis Lovenox  AM Labs Ordered, also please review Full Orders  Family Communication: Discussed with [place] her daughter who asked to be informed on daily basis about the progress of her mom.  Code Status full  Disposition Plan: Home  Time spent in minutes : 44 minutes  Condition GUARDED   @SIGNATURE @

## 2019-08-11 NOTE — ED Notes (Signed)
Per Neuro MD, pt can have a regular diet meal tray ordered.

## 2019-08-11 NOTE — ED Notes (Signed)
Attempted report 

## 2019-08-11 NOTE — ED Notes (Signed)
Patient transported to MRI 

## 2019-08-11 NOTE — ED Provider Notes (Signed)
[name]   CSN: [id number] Arrival date & time: 08/11/19  0856    History   Chief Complaint Chief Complaint  Patient presents with   Altered Mental Status    HPI Connie Meyer is a 68 y.o. female with history of recent ischemic stroke in June 2020 presents the ED complaining of confusion and difficulty performing tasks since 8 PM yesterday evening.  Patient reports she went for a jog and then when she got home was unable to figure out how to open her door and had difficulty unlocking her phone and figuring out how to flush the toilet.  She reports she had a headache at the base of her skull/upper neck when she went to bed last night.  Patient reports when she woke up this morning she still felt confused and had difficulty figuring out how to call 911.  She reports her symptoms have mostly improved on arrival to the ED.  She denies any focal weakness during the episode and denies any numbness, tingling, dizziness, balance issues, gait disturbance, or vision changes.  She reports she is on aspirin and is enrolled in the [place] study where she is receiving either apixaban 2.5 mg BID or placebo since her stroke in June.  She reports her stroke at that time was in the occipital lobe and presented with left eye vision loss which has since resolved.  She denies any recent illness, fever, chills, cough, shortness of breath, chest pain, abdominal pain, nausea, vomiting, diarrhea, or any other complaints.     The history is provided by the patient.    Past Medical History:  Diagnosis Date   Cancer [facility])    bilateral breast   HTN (hypertension)    Stroke [facility])     Patient Active Problem List   Diagnosis Date Noted   CVA (cerebral vascular accident) ([place]) 08/11/2019   Acute CVA (cerebrovascular accident) ([place]) 06/14/2019   HTN (hypertension) 06/14/2019   Hypokalemia 06/14/2019   H/O bilateral mastectomy 02/17/2019    Rib pain on left side 09/18/2016   Hiatal hernia 09/05/2016   History of breast cancer in female-2000, 2003 03/17/2016    Past Surgical History:  Procedure Laterality Date   BREAST SURGERY     BUBBLE STUDY  06/16/2019   Procedure: BUBBLE STUDY;  Surgeon: Fay Records, MD;  Location: [place];  Service: Cardiovascular;;   LOOP RECORDER INSERTION N/A 06/16/2019   Procedure: LOOP RECORDER INSERTION;  Surgeon: Evans Lance, MD;  Location: [place] CV LAB;  Service: Cardiovascular;  Laterality: N/A;   TEE WITHOUT CARDIOVERSION N/A 06/16/2019   Procedure: TRANSESOPHAGEAL ECHOCARDIOGRAM (TEE);  Surgeon: Fay Records, MD;  Location: [facility] ENDOSCOPY;  Service: Cardiovascular;  Laterality: N/A;     OB History   No obstetric history on file.      Home Medications    Prior to Admission medications   Medication Sig Start Date End Date Taking? Authorizing Provider  amLODipine (NORVASC) 5 MG tablet Take 5 mg by mouth daily.   Yes [provider]  atorvastatin (LIPITOR) 40 MG tablet Take 1 tablet (40 mg total) by mouth daily at 6 PM. 06/16/19  Yes Vann, Jessica U, DO  bimatoprost (LATISSE) 0.03 % ophthalmic solution 1 application daily. 07/25/19  Yes [provider]  fluticasone (FLONASE) 50 MCG/ACT nasal spray Place 1 spray into both nostrils 2 (two) times daily. 07/07/19  Yes [name], MD  [place] - apixaban  2.5mg  or placebo (PI - Sethi) Take 2.5 mg by mouth 2 (two) times daily.   Yes [provider]  aspirin EC 81 MG EC tablet Take 1 tablet (81 mg total) by mouth daily. Patient not taking: Reported on 07/23/2019 06/17/19   Geradine Girt, DO    Family History Family History  Problem Relation Age of Onset   Hypertension Mother    Hypertension Father     Social History Social History   Tobacco Use   Smoking status: Never Smoker   Smokeless tobacco: Never Used  Substance Use Topics   Alcohol use: Yes    Alcohol/week: 1.0  standard drinks    Types: 1 Glasses of wine per week   Drug use: No     Allergies   Patient has no known allergies.   Review of Systems Review of Systems  Constitutional: Negative for chills and fever.  HENT: Negative for ear pain and sore throat.   Eyes: Negative for pain and visual disturbance.  Respiratory: Negative for cough and shortness of breath.   Cardiovascular: Negative for chest pain and palpitations.  Gastrointestinal: Negative for abdominal pain and vomiting.  Genitourinary: Negative for dysuria and hematuria.  Musculoskeletal: Negative for arthralgias and back pain.  Skin: Negative for color change and rash.  Neurological: Positive for headaches. Negative for dizziness, tremors, seizures, syncope, facial asymmetry, speech difficulty, weakness, light-headedness and numbness.  All other systems reviewed and are negative.    Physical Exam Updated Vital Signs BP 134/68 (BP Location: Left Arm)    Pulse 69    Temp 98.4 F (36.9 C) (Oral)    Resp 20    SpO2 100%   Physical Exam Vitals signs and nursing note reviewed.  Constitutional:      General: She is not in acute distress.    Appearance: She is well-developed. She is not ill-appearing, toxic-appearing or diaphoretic.  HENT:     Head: Normocephalic and atraumatic.     Nose: Nose normal. No congestion or rhinorrhea.     Mouth/Throat:     Mouth: Mucous membranes are moist.     Pharynx: Oropharynx is clear. No oropharyngeal exudate or posterior oropharyngeal erythema.  Eyes:     Extraocular Movements: Extraocular movements intact.     Conjunctiva/sclera: Conjunctivae normal.     Pupils: Pupils are equal, round, and reactive to light.  Neck:     Musculoskeletal: Normal range of motion and neck supple. No neck rigidity or muscular tenderness.  Cardiovascular:     Rate and Rhythm: Normal rate and regular rhythm.     Pulses: Normal pulses.     Heart sounds: Normal heart sounds. No murmur. No friction rub. No  gallop.   Pulmonary:     Effort: Pulmonary effort is normal. No respiratory distress.     Breath sounds: Normal breath sounds. No stridor. No wheezing, rhonchi or rales.  Chest:     Chest wall: No tenderness.  Abdominal:     General: Abdomen is flat. There is no distension.     Palpations: Abdomen is soft.     Tenderness: There is no abdominal tenderness. There is no guarding or rebound.  Musculoskeletal: Normal range of motion.        General: No swelling, tenderness, deformity or signs of injury.  Skin:    General: Skin is warm and dry.  Neurological:     General: No focal deficit present.     Mental Status: She is alert and oriented to person,  place, and time. Mental status is at baseline.     Cranial Nerves: No cranial nerve deficit.     Sensory: No sensory deficit.     Motor: No weakness.     Coordination: Coordination normal.  Psychiatric:        Mood and Affect: Mood normal.        Behavior: Behavior normal.      ED Treatments / Results  Labs (all labs ordered are listed, but only abnormal results are displayed) Labs Reviewed  CBC - Abnormal; Notable for the following components:      Result Value   RBC 3.74 (*)    MCV 100.3 (*)    All other components within normal limits  I-STAT CHEM 8, ED - Abnormal; Notable for the following components:   Potassium 3.4 (*)    All other components within normal limits  SARS CORONAVIRUS 2  PROTIME-INR  APTT  DIFFERENTIAL  COMPREHENSIVE METABOLIC PANEL  CBG MONITORING, ED    EKG EKG Interpretation  Date/Time:  Monday August 11 2019 09:12:53 EDT Ventricular Rate:  71 PR Interval:  164 QRS Duration: 78 QT Interval:  394 QTC Calculation: 428 R Axis:   70 Text Interpretation:  Sinus rhythm Artifact Otherwise within normal limits Confirmed by Carmin Muskrat [phone]) on 08/11/2019 11:24:38 AM   Radiology Ct Head Wo Contrast  Result Date: 08/11/2019 CLINICAL DATA:  Difficulty remembering how to do simple tasks, history  stroke, hypertension, breast cancer EXAM: CT HEAD WITHOUT CONTRAST TECHNIQUE: Contiguous axial images were obtained from the base of the skull through the vertex without intravenous contrast. COMPARISON:  None Correlation: CT head 06/14/2019 FINDINGS: Brain: Normal ventricular morphology. No midline shift or mass effect. Mild small vessel chronic ischemic changes of deep cerebral white matter. Small old deep infarct in LEFT cerebellar hemisphere. Question tiny lacunar infarct in RIGHT basal ganglia. No intracranial hemorrhage, mass lesion, or evidence of additional infarction. No extra-axial fluid collections. Vascular: No hyperdense vessels Skull: Intact Sinuses/Orbits: Clear Other: N/A IMPRESSION: Mild small vessel chronic ischemic changes of deep cerebral white matter. Question tiny age indeterminate lacunar infarct RIGHT basal ganglia. Small old LEFT cerebellar infarct. No intracranial hemorrhage. Electronically Signed   By: Lavonia Dana M.D.   On: 08/11/2019 10:59   Mr Brain Wo Contrast  Result Date: 08/11/2019 CLINICAL DATA:  Transient difficulty with tasks EXAM: MRI HEAD WITHOUT CONTRAST TECHNIQUE: Multiplanar, multiecho pulse sequences of the brain and surrounding structures were obtained without intravenous contrast. COMPARISON:  CT head 08/11/2019.  MRI head 06/13/2019 FINDINGS: Brain: Scattered small cortical subcortical infarcts in the right parietal lobe. Chronic microvascular ischemic change in the white matter, similar to the prior MRI. Ventricle size normal. Negative for hemorrhage or mass. Vascular: Normal arterial flow voids. Skull and upper cervical spine: Negative Sinuses/Orbits: Mild mucosal edema paranasal sinuses. Bilateral mastoid effusion. Bilateral cataract surgery Other: None IMPRESSION: Scattered small cortical and subcortical infarcts in the right parietal lobe. Negative for hemorrhage Mild chronic microvascular ischemic change in the white matter stable since the prior MRI. Prior MRI  demonstrated a small acute infarct left occipital lobe which has resolved. Consider cerebral emboli. Electronically Signed   By: Franchot Gallo M.D.   On: 08/11/2019 13:55    Procedures Procedures (including critical care time)  Medications Ordered in ED Medications  sodium chloride flush (NS) 0.9 % injection 3 mL (3 mLs Intravenous Given 08/11/19 0948)     Initial Impression / Assessment and Plan / ED Course  I have reviewed  the triage vital signs and the nursing notes.  Pertinent labs & imaging results that were available during my care of the patient were reviewed by me and considered in my medical decision making (see chart for details).        Connie Meyer is a 68 y.o. female with history of recent ischemic stroke in June 2020 presents the ED complaining of apraxia to tasks since around 8 PM last night that has somewhat improved.  Patient did have difficulty dialing 911 to come to the ED.  On exam, patient has no focal neurologic deficits.  Patient reports she was placed on a loop recorder after her prior stroke but lost it.  CMP and CBC were unremarkable.  CT of the head shows questionable age-indeterminate right basal ganglia infarct.  Neurology was consulted who recommended MRI brain.  MRI brain was obtained which showed scattered small cortical and subcortical infarcts in the right parietal lobe.  Neurology evaluated patient in the ED and recommended admission.  Patient will be admitted to the hospitalist for further stroke workup.   Final Clinical Impressions(s) / ED Diagnoses   Final diagnoses:  Ischemic stroke [facility])    ED Discharge Orders    None       Candie Chroman, MD 08/11/19 1603    Carmin Muskrat, MD 08/14/19 [phone]

## 2019-08-12 ENCOUNTER — Other Ambulatory Visit (HOSPITAL_COMMUNITY): Payer: Medicare Other

## 2019-08-12 ENCOUNTER — Encounter (HOSPITAL_COMMUNITY)
Admission: EM | Disposition: A | Discharge status: Home / Self Care | Payer: Self-pay | Source: Home / Self Care | Attending: Internal Medicine

## 2019-08-12 DIAGNOSIS — Z79899 Other long term (current) drug therapy: Secondary | ICD-10-CM | POA: Diagnosis not present

## 2019-08-12 DIAGNOSIS — R29700 NIHSS score 0: Secondary | ICD-10-CM | POA: Diagnosis present

## 2019-08-12 DIAGNOSIS — Z8249 Family history of ischemic heart disease and other diseases of the circulatory system: Secondary | ICD-10-CM | POA: Diagnosis not present

## 2019-08-12 DIAGNOSIS — Z7951 Long term (current) use of inhaled steroids: Secondary | ICD-10-CM | POA: Diagnosis not present

## 2019-08-12 DIAGNOSIS — Z20828 Contact with and (suspected) exposure to other viral communicable diseases: Secondary | ICD-10-CM | POA: Diagnosis present

## 2019-08-12 DIAGNOSIS — I6389 Other cerebral infarction: Secondary | ICD-10-CM | POA: Diagnosis not present

## 2019-08-12 DIAGNOSIS — E785 Hyperlipidemia, unspecified: Secondary | ICD-10-CM | POA: Diagnosis not present

## 2019-08-12 DIAGNOSIS — I10 Essential (primary) hypertension: Secondary | ICD-10-CM | POA: Diagnosis present

## 2019-08-12 DIAGNOSIS — I6300 Cerebral infarction due to thrombosis of unspecified precerebral artery: Secondary | ICD-10-CM | POA: Diagnosis not present

## 2019-08-12 DIAGNOSIS — Z9013 Acquired absence of bilateral breasts and nipples: Secondary | ICD-10-CM | POA: Diagnosis not present

## 2019-08-12 DIAGNOSIS — J329 Chronic sinusitis, unspecified: Secondary | ICD-10-CM | POA: Diagnosis present

## 2019-08-12 DIAGNOSIS — Z7901 Long term (current) use of anticoagulants: Secondary | ICD-10-CM | POA: Diagnosis not present

## 2019-08-12 DIAGNOSIS — I6340 Cerebral infarction due to embolism of unspecified cerebral artery: Secondary | ICD-10-CM | POA: Diagnosis present

## 2019-08-12 DIAGNOSIS — R482 Apraxia: Secondary | ICD-10-CM | POA: Diagnosis present

## 2019-08-12 DIAGNOSIS — Z853 Personal history of malignant neoplasm of breast: Secondary | ICD-10-CM | POA: Diagnosis not present

## 2019-08-12 DIAGNOSIS — Z8673 Personal history of transient ischemic attack (TIA), and cerebral infarction without residual deficits: Secondary | ICD-10-CM | POA: Diagnosis not present

## 2019-08-12 DIAGNOSIS — G453 Amaurosis fugax: Secondary | ICD-10-CM | POA: Diagnosis present

## 2019-08-12 DIAGNOSIS — D6859 Other primary thrombophilia: Secondary | ICD-10-CM | POA: Diagnosis present

## 2019-08-12 HISTORY — PX: LOOP RECORDER INSERTION: EP1214

## 2019-08-12 HISTORY — DX: Hyperlipidemia, unspecified: E78.5

## 2019-08-12 LAB — LIPID PANEL
Cholesterol: 133 mg/dL (ref 0–200)
HDL: 52 mg/dL (ref >40)
LDL Cholesterol: 67 mg/dL (ref 0–99)
Total CHOL/HDL Ratio: 2.6 RATIO
Triglycerides: 72 mg/dL (ref <150)
VLDL: 14 mg/dL (ref 0–40)

## 2019-08-12 LAB — HEMOGLOBIN A1C
Hgb A1c MFr Bld: 5.5 % (ref 4.8–5.6)
Mean Plasma Glucose: 111.15 mg/dL

## 2019-08-12 SURGERY — LOOP RECORDER INSERTION

## 2019-08-12 MED ORDER — LIDOCAINE-EPINEPHRINE 1 %-1:100000 IJ SOLN
INTRAMUSCULAR | Status: AC
  Filled 2019-08-12: qty 1

## 2019-08-12 MED ORDER — LIDOCAINE-EPINEPHRINE 1 %-1:100000 IJ SOLN
INTRAMUSCULAR | Status: DC | PRN
  Administered 2019-08-12: 30 mL

## 2019-08-12 MED ORDER — APIXABAN 5 MG PO TABS: 5.0000 mg | ORAL_TABLET | Freq: Two times a day (BID) | ORAL | 0 refills | Status: DC

## 2019-08-12 SURGICAL SUPPLY — 2 items
PACK LOOP INSERTION (CUSTOM PROCEDURE TRAY) ×2 IMPLANT
SYSTEM MONITOR REVEAL LINQ II (Prosthesis & Implant Heart) ×2 IMPLANT

## 2019-08-12 NOTE — TOC Benefit Eligibility Note (Signed)
Transition of Care [facility]) Benefit Eligibility Note    Patient Details  Name: Connie Meyer MRN: [id number] Date of Birth: 1951-06-26   Medication/Dose: Elquis 5 mg BID  Covered?: Yes  Tier: 3 Drug  Prescription Coverage Preferred Pharmacy: [facility]  Spoke with Person/Company/Phone Number:: Elta [place]  Co-Pay: 47.00 retail or Mail Order  Prior Approval: No  Deductible: Unmet(365.00)       Orbie Pyo Phone Number: 08/12/2019, 1:10 PM

## 2019-08-12 NOTE — TOC Transition Note (Signed)
Transition of Care [facility]) - CM/SW Discharge Note   Patient Details  Name: RASHELLE IRELAND MRN: [id number] Date of Birth: 1951/05/09  Transition of Care [facility]) CM/SW Contact:  Pollie Friar, RN Phone Number: 08/12/2019, 3:00 PM   Clinical Narrative:    Pt is discharging home with self care. No f/u per OT and ST.  Pt is comfortable with the $47/month Copay for the Eliquis. CM provided her 30 day free card.  Pt has transportation home.    Final next level of care: Home/Self Care Barriers to Discharge: No Barriers Identified   Patient Goals and CMS Choice        Discharge Placement                       Discharge Plan and Services                                     Social Determinants of Health (SDOH) Interventions     Readmission Risk Interventions No flowsheet data found.

## 2019-08-12 NOTE — Consult Note (Addendum)
ELECTROPHYSIOLOGY CONSULT NOTE  Patient ID: Connie Meyer MRN: [id number], DOB/AGE: [birth year]   Admit date: 08/11/2019 Date of Consult: 08/12/2019  Primary Physician: [name], MD Electrophysiologist: Dr.Taylor, loop implant 06/16/2019 Reason for Consultation: Cryptogenic stroke ; recommendations regarding Implantable Loop Recorder, requested by Dr. Leonie Man  History of Present Illness Connie Meyer was admitted on 08/11/2019 with difficulty doing simple tasks, and found with recurrent stroke.  She first developed symptoms while at home.    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  Subsequently the patient noted she could no longer palpate the device, and in-clinic unable to communicate with loop.  CXR was done noting no sign of the device, unclear, though apparently device was expelled by some means after her wound check visit  At which time the wound appeared to have been healed.  She was planned for re-implant with Dr. Lovena Le of a [employer] device.   Neurology noted multiple small infarcts in the right posterior parietal lobe.  she had with her prior stroke undergone workup for stroke including echocardiogram and carotid dopplers and TEE.  Neurology NP has reached out to re-implant loop today with no need to repeat w/u.   06/14/2019 TTE   IMPRESSIONS  1. The left ventricle has normal systolic function, with an ejection fraction of 55-60%. The cavity size was normal. Left ventricular diastolic Doppler parameters are consistent with impaired relaxation. Indeterminate filling pressures The E/e' is 8-15.  2. The right ventricle has normal systolic function. The cavity was normal. There is no increase in right ventricular wall thickness.  3. The mitral valve is grossly normal.  4. The tricuspid valve is grossly normal.  5. The aortic valve is grossly  normal. No stenosis of the aortic valve.   06/16/2019 : TEE IMPRESSIONS 1. The left ventricle has normal systolic function, with an ejection fraction of 55-60%. The cavity size was normal.  2. The right ventricle has normal systolc function.  3. LA, LAA without masses.  4. With injection of agitated saline a few bubbles appeared late in left atrium consistent with intrapulmonary shunting (small).  5. The aortic valve is tricuspid Aortic valve regurgitation is trivial by color flow Doppler.  6. Minimal fixed plaquing in the thoracic aorta.   06/14/2019, Carotid US Summary: Right Carotid: Velocities in the right ICA are consistent with a 1-39% stenosis.  Left Carotid: Velocities in the left ICA are consistent with a 1-39% stenosis.  Vertebrals:  Bilateral vertebral arteries demonstrate antegrade flow. Subclavians: Normal flow hemodynamics were seen in bilateral subclavian              arteries.   Lab work is reviewed.    Prior to admission, the patient denies chest pain, shortness of breath, dizziness, or syncope.  She does feel palpitations, they are usual brief/fleeting sometimes weekly, sometimes more and less frequent.  She is recovered from herstroke with plans to home at discharge.    Past Medical History:  Diagnosis Date   Cancer [facility])    bilateral breast   HTN (hypertension)    Stroke [facility])      Surgical History:  Past Surgical History:  Procedure Laterality Date   BREAST SURGERY     BUBBLE STUDY  06/16/2019   Procedure: BUBBLE STUDY;  Surgeon: Fay Records, MD;  Location: [place];  Service: Cardiovascular;;   LOOP RECORDER INSERTION N/A 06/16/2019  Procedure: LOOP RECORDER INSERTION;  Surgeon: Evans Lance, MD;  Location: [place] CV LAB;  Service: Cardiovascular;  Laterality: N/A;   TEE WITHOUT CARDIOVERSION N/A 06/16/2019   Procedure: TRANSESOPHAGEAL ECHOCARDIOGRAM (TEE);  Surgeon: Fay Records, MD;  Location: [facility] ENDOSCOPY;  Service:  Cardiovascular;  Laterality: N/A;     Medications Prior to Admission  Medication Sig Dispense Refill Last Dose   amLODipine (NORVASC) 5 MG tablet Take 5 mg by mouth daily.   08/11/2019 at Unknown time   atorvastatin (LIPITOR) 40 MG tablet Take 1 tablet (40 mg total) by mouth daily at 6 PM. 30 tablet 1 08/10/2019 at Unknown time   bimatoprost (LATISSE) 0.03 % ophthalmic solution 1 application daily.   08/10/2019 at Unknown time   fluticasone (FLONASE) 50 MCG/ACT nasal spray Place 1 spray into both nostrils 2 (two) times daily. 16 g 6 Past Week at Unknown time   STUDY - [place] - apixaban 2.5mg  or placebo (PI - Sethi) Take 2.5 mg by mouth 2 (two) times daily.   08/10/2019 at Unknown time   aspirin EC 81 MG EC tablet Take 1 tablet (81 mg total) by mouth daily. (Patient not taking: Reported on 07/23/2019) 30 tablet 0 Not Taking at Unknown time    Inpatient Medications:   amLODipine  5 mg Oral Daily   aspirin EC  81 mg Oral Daily   atorvastatin  40 mg Oral q1800   clopidogrel  75 mg Oral Daily   enoxaparin (LOVENOX) injection  40 mg Subcutaneous Q24H   fluticasone  1 spray Each Nare BID   latanoprost  1 drop Both Eyes QHS    Allergies: No Known Allergies  Social History   Socioeconomic History   Marital status: Divorced    Spouse name: Not on file   Number of children: 4   Years of education: Not on file   Highest education level: Not on file  Occupational History   Not on file  Social Needs   Financial resource strain: Not on file   Food insecurity    Worry: Not on file    Inability: Not on file   Transportation needs    Medical: Not on file    Non-medical: Not on file  Tobacco Use   Smoking status: Never Smoker   Smokeless tobacco: Never Used  Substance and Sexual Activity   Alcohol use: Yes    Alcohol/week: 1.0 standard drinks    Types: 1 Glasses of wine per week   Drug use: No   Sexual activity: Never  Lifestyle   Physical activity    Days  per week: Not on file    Minutes per session: Not on file   Stress: Not on file  Relationships   Social connections    Talks on phone: Not on file    Gets together: Not on file    Attends religious service: Not on file    Active member of club or organization: Not on file    Attends meetings of clubs or organizations: Not on file    Relationship status: Not on file   Intimate partner violence    Fear of current or ex partner: Not on file    Emotionally abused: Not on file    Physically abused: Not on file    Forced sexual activity: Not on file  Other Topics Concern   Not on file  Social History Narrative   Not on file     Family History  Problem Relation  Age of Onset   Hypertension Mother    Hypertension Father       Review of Systems: All other systems reviewed and are otherwise negative except as noted above.  Physical Exam: Vitals:   08/11/19 1947 08/12/19 0017 08/12/19 0432 08/12/19 0756  BP: (!) 149/85 127/77 139/74 135/66  Pulse: 70 67 61 63  Resp: (!) 24 (!) 24 (!) 24 16  Temp: 97.7 F (36.5 C) 97.6 F (36.4 C) 97.6 F (36.4 C) 98.2 F (36.8 C)  TempSrc: Oral Oral Oral Oral  SpO2: 94% 94% 100% 99%  Weight:      Height:        GEN- The patient is well appearing, alert and oriented x 3 today.   Head- normocephalic, atraumatic Eyes-  Sclera clear, conjunctiva pink Ears- hearing intact Oropharynx- clear Neck- supple Lungs- CTA b/l, normal work of breathing Heart- RRR, no murmurs, rubs or gallops  GI- soft, NT, ND Extremities- no clubbing, cyanosis, or edema MS- no significant deformity or atrophy Skin- no rash or lesion Psych- euthymic mood, full affect   Labs:   Lab Results  Component Value Date   WBC 4.3 08/11/2019   HGB 12.6 08/11/2019   HCT 37.0 08/11/2019   MCV 100.3 (H) 08/11/2019   PLT 170 08/11/2019    Recent Labs  Lab 08/11/19 0949 08/11/19 0957  NA 142 144  K 3.7 3.4*  CL 108 107  CO2 27  --   BUN 9 10  CREATININE  0.80 0.70  CALCIUM 9.2  --   PROT 7.1  --   BILITOT 0.8  --   ALKPHOS 75  --   ALT 19  --   AST 21  --   GLUCOSE 99 92   Lab Results  Component Value Date   CKTOTAL 122 06/14/2019   Lab Results  Component Value Date   CHOL 133 08/12/2019   CHOL 184 06/14/2019   CHOL 176 02/17/2019   Lab Results  Component Value Date   HDL 52 08/12/2019   HDL 64 06/14/2019   HDL 62 02/17/2019   Lab Results  Component Value Date   LDLCALC 67 08/12/2019   LDLCALC 107 (H) 06/14/2019   LDLCALC 100 (H) 02/17/2019   Lab Results  Component Value Date   TRIG 72 08/12/2019   TRIG 67 06/14/2019   TRIG 69 02/17/2019   Lab Results  Component Value Date   CHOLHDL 2.6 08/12/2019   CHOLHDL 2.9 06/14/2019   CHOLHDL 2.8 02/17/2019   No results found for: LDLDIRECT  No results found for: DDIMER   Radiology/Studies:   Ct Head Wo Contrast Result Date: 08/11/2019 CLINICAL DATA:  Difficulty remembering how to do simple tasks, history stroke, hypertension, breast cancer EXAM: CT HEAD WITHOUT CONTRAST TECHNIQUE: Contiguous axial images were obtained from the base of the skull through the vertex without intravenous contrast. COMPARISON:  None Correlation: CT head 06/14/2019 FINDINGS: Brain: Normal ventricular morphology. No midline shift or mass effect. Mild small vessel chronic ischemic changes of deep cerebral white matter. Small old deep infarct in LEFT cerebellar hemisphere. Question tiny lacunar infarct in RIGHT basal ganglia. No intracranial hemorrhage, mass lesion, or evidence of additional infarction. No extra-axial fluid collections. Vascular: No hyperdense vessels Skull: Intact Sinuses/Orbits: Clear Other: N/A IMPRESSION: Mild small vessel chronic ischemic changes of deep cerebral white matter. Question tiny age indeterminate lacunar infarct RIGHT basal ganglia. Small old LEFT cerebellar infarct. No intracranial hemorrhage. Electronically Signed   By: Crist Infante.D.  On: 08/11/2019 10:59    Mr  Brain Wo Contrast Result Date: 08/11/2019 CLINICAL DATA:  Transient difficulty with tasks EXAM: MRI HEAD WITHOUT CONTRAST TECHNIQUE: Multiplanar, multiecho pulse sequences of the brain and surrounding structures were obtained without intravenous contrast. COMPARISON:  CT head 08/11/2019.  MRI head 06/13/2019 FINDINGS: Brain: Scattered small cortical subcortical infarcts in the right parietal lobe. Chronic microvascular ischemic change in the white matter, similar to the prior MRI. Ventricle size normal. Negative for hemorrhage or mass. Vascular: Normal arterial flow voids. Skull and upper cervical spine: Negative Sinuses/Orbits: Mild mucosal edema paranasal sinuses. Bilateral mastoid effusion. Bilateral cataract surgery Other: None IMPRESSION: Scattered small cortical and subcortical infarcts in the right parietal lobe. Negative for hemorrhage Mild chronic microvascular ischemic change in the white matter stable since the prior MRI. Prior MRI demonstrated a small acute infarct left occipital lobe which has resolved. Consider cerebral emboli. Electronically Signed   By: Franchot Gallo M.D.   On: 08/11/2019 13:55    12-lead ECG SR All prior EKG's in EPIC reviewed with no documented atrial fibrillation  Telemetry SR, occ PACs, PVCs  Assessment and Plan:  1. Cryptogenic stroke The patient presents with cryptogenic stroke.  I have revisited loop implant rational with the patient, she very much wants to proceed, especially now after another stroke.  Risks, benefits, and alteratives to implantable loop recorder were discussed with the patient today.    Wound care was reviewed with the patient (keep incision clean and dry for 3 days).  Wound check will be scheduled for the patient.  Please call with questions.   Renee Dyane Dustman, PA-C 08/12/2019   I have seen, examined the patient, and reviewed the above assessment and plan.  Changes to above are made where necessary.  On exam, RRR.  Pt with prior ILR  that dehisced and is no longer implanted.  I agree with neurology that long term monitoring would be beneficial in further evaluation of her recurrent cryptogenic strokes. Risks and benefits to ILR were discussed with the patient who wishes to proceed.  Co Sign: Thompson Grayer, MD 08/12/2019 1:25 PM

## 2019-08-12 NOTE — Progress Notes (Addendum)
STROKE TEAM PROGRESS NOTE   INTERVAL HISTORY I have personally reviewed history of presenting illness, electronic medical records and imaging films in PACS.  She had a cryptogenic left hemispheric infarct in June 2020 following which he undergo loop recorder insertion and TEE with a negative work-up.  She apparently inadvertently expelled her loop recorder at some point as at last device clinic visit loop recorder could not be interrogated and chest x-ray showed that it was not there.  There were plans to reimplant new device as per EP team.  She presented presented this time with symptoms suggestive of apraxia with not being able to do things like opening the door with a key in the lock flushing the toilet since yesterday.  MRI confirms a new embolic right parietal cortical and subcortical infarct.  She feels that this morning she is a lot better and almost back to her baseline.  Patient was randomized to the [place] trial and had received the study medication only for 7 to 10 days.  She has met the study and point for the travel and has been advised to stop the trial medication  Vitals:   08/11/19 1947 08/12/19 0017 08/12/19 0432 08/12/19 0756  BP: (!) 149/85 127/77 139/74 135/66  Pulse: 70 67 61 63  Resp: (!) 24 (!) 24 (!) 24 16  Temp: 97.7 F (36.5 C) 97.6 F (36.4 C) 97.6 F (36.4 C) 98.2 F (36.8 C)  TempSrc: Oral Oral Oral Oral  SpO2: 94% 94% 100% 99%  Weight:      Height:        CBC:  Recent Labs  Lab 08/11/19 0949 08/11/19 0957  WBC 4.3  --   NEUTROABS 3.1  --   HGB 12.0 12.6  HCT 37.5 37.0  MCV 100.3*  --   PLT 170  --     Basic Metabolic Panel:  Recent Labs  Lab 08/11/19 0949 08/11/19 0957  NA 142 144  K 3.7 3.4*  CL 108 107  CO2 27  --   GLUCOSE 99 92  BUN 9 10  CREATININE 0.80 0.70  CALCIUM 9.2  --    Lipid Panel:     Component Value Date/Time   CHOL 133 08/12/2019 0350   CHOL 176 02/17/2019 1640   TRIG 72 08/12/2019 0350   HDL 52 08/12/2019 0350    HDL 62 02/17/2019 1640   CHOLHDL 2.6 08/12/2019 0350   VLDL 14 08/12/2019 0350   LDLCALC 67 08/12/2019 0350   LDLCALC 100 (H) 02/17/2019 1640   HgbA1c:  Lab Results  Component Value Date   HGBA1C 5.5 08/12/2019   IMAGING Ct Head Wo Contrast  Result Date: 08/11/2019 CLINICAL DATA:  Difficulty remembering how to do simple tasks, history stroke, hypertension, breast cancer EXAM: CT HEAD WITHOUT CONTRAST TECHNIQUE: Contiguous axial images were obtained from the base of the skull through the vertex without intravenous contrast. COMPARISON:  None Correlation: CT head 06/14/2019 FINDINGS: Brain: Normal ventricular morphology. No midline shift or mass effect. Mild small vessel chronic ischemic changes of deep cerebral white matter. Small old deep infarct in LEFT cerebellar hemisphere. Question tiny lacunar infarct in RIGHT basal ganglia. No intracranial hemorrhage, mass lesion, or evidence of additional infarction. No extra-axial fluid collections. Vascular: No hyperdense vessels Skull: Intact Sinuses/Orbits: Clear Other: N/A IMPRESSION: Mild small vessel chronic ischemic changes of deep cerebral white matter. Question tiny age indeterminate lacunar infarct RIGHT basal ganglia. Small old LEFT cerebellar infarct. No intracranial hemorrhage. Electronically Signed   By:  Lavonia Dana M.D.   On: 08/11/2019 10:59   Mr Brain Wo Contrast  Result Date: 08/11/2019 CLINICAL DATA:  Transient difficulty with tasks EXAM: MRI HEAD WITHOUT CONTRAST TECHNIQUE: Multiplanar, multiecho pulse sequences of the brain and surrounding structures were obtained without intravenous contrast. COMPARISON:  CT head 08/11/2019.  MRI head 06/13/2019 FINDINGS: Brain: Scattered small cortical subcortical infarcts in the right parietal lobe. Chronic microvascular ischemic change in the white matter, similar to the prior MRI. Ventricle size normal. Negative for hemorrhage or mass. Vascular: Normal arterial flow voids. Skull and upper cervical  spine: Negative Sinuses/Orbits: Mild mucosal edema paranasal sinuses. Bilateral mastoid effusion. Bilateral cataract surgery Other: None IMPRESSION: Scattered small cortical and subcortical infarcts in the right parietal lobe. Negative for hemorrhage Mild chronic microvascular ischemic change in the white matter stable since the prior MRI. Prior MRI demonstrated a small acute infarct left occipital lobe which has resolved. Consider cerebral emboli. Electronically Signed   By: Franchot Gallo M.D.   On: 08/11/2019 13:55    PHYSICAL EXAM Pleasant middle-aged Caucasian lady not in distress. . Afebrile. Head is nontraumatic. Neck is supple without bruit.    Cardiac exam no murmur or gallop. Lungs are clear to auscultation. Distal pulses are well felt. Neurological Exam ;  Awake  Alert oriented x 3. Normal speech and language.eye movements full without nystagmus.fundi were not visualized. Vision acuity and fields appear normal. Hearing is normal. Palatal movements are normal. Face symmetric. Tongue midline. Normal strength, tone, reflexes and coordination. Normal sensation. Gait deferred.  ASSESSMENT/PLAN Ms. Joshlynn Alfonzo Yzaguirre is a 68 y.o. female with history of previous breast cancer and cryptogenic stroke in June presenting with 2 transient episodes of confusion.   Stroke:   Recurrent cryptogenic stroke - R parietal embolic in setting of L parietal embolic infarct 2 mos ago -  secondary to unknown source  CT head mild small vessel disease. Atrophy. Old R basal ganglia and L cerebellar infarct.   MRI  Scattered small cortical and subcoritcal R parietal lobe infarcts. Mild Small vessel disease. Old L occipital lobe infarct resolved from June 2020.  LDL 67  HgbA1c 5.5  Lovenox 40 mg sq daily for VTE prophylaxis  Enrolled in [place] prior to admission, has met study end point, now on aspirin 81 mg daily and clopidogrel 75 mg daily. Given her recent qualification for the [place] trial and 2 embolic  events, would like to place her on Eliquis. Will ask care management to evaluate for insurance coverage. If they cover, will use. If they do not cover, will continue aspirin and plavix.   Loop recorder placed during June hospitalization out. Plans in place for OP replacement. Given rehospitalization, will get it replaced today prior to d/c. EP agreeable  Given hx breast cancer, check CT amd/chest/pelvis to rule out cancer/mets -    Therapy recommendations:  No anticipated therapy needs  Disposition:  D/c home later today after loop replacement and imaging  Follow up [facility] in 4 - 6 weeks  Hypertension  Stable . Permissive hypertension (OK if < 220/120) but gradually normalize in 5-7 days . Long-term BP goal normotensive  Hyperlipidemia  Home meds:  lipitor 40, resumed in hospital  LDL 67, goal < 70  Continue statin at discharge  Other Stroke Risk Factors  Advanced age  ETOH use, alcohol level No results found, advised to drink no more than 1 drink(s) a day  Hx stroke/TIA  05/2009 - punctate acute/early subacute infarctions within the left  occipital lobe - embolic - source unknown - loop placed. Put on aspirin 325 and zocor 80. No therapy needs.  Other Problems  Hx breast cancer  Hospital day # 1  I have personally obtained history,examined this patient, reviewed notes, independently viewed imaging studies, participated in medical decision making and plan of care.ROS completed by me personally and pertinent positives fully documented  I have made any additions or clarifications directly to the above note.  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  She had a loop recorder inserted in June but it inadvertently came out.  Recommend reinsertion of loop recorder by EP team.  Patient was also recently randomized into the [place] trial and has met this study endpoint hence was advised use  stop the [place] medication.  We will try to see if her insurance will cover Eliquis if not she may need to be on aspirin Plavix for 3 weeks followed by Plavix alone.  Patient has remote history of breast cancer but no currently active symptoms.  Consider checking CT scan of the chest abdomen and pelvis to rule out malignancy and hypercoagulability as a cause of recurrent strokes.  Long discussion with patient, Dr. Delphia Grates and EP team and answered questions.  Greater than 50% time during this 35-minute visit was spent on counseling and coordination of care about her recurrent cryptogenic strokes and answering questions Follow-up in the clinic as per [place] trial end of study visit Antony Contras, [place] Pager: [phone] 08/12/2019 1:30 PM   To contact Stroke Continuity provider, please refer to [email or web address] After hours, contact General Neurology

## 2019-08-12 NOTE — TOC Initial Note (Signed)
Transition of Care [facility]) - Initial/Assessment Note    Patient Details  Name: Connie Meyer MRN: [id number] Date of Birth: January 05, 1951  Transition of Care [facility]) CM/SW Contact:    Pollie Friar, RN Phone Number: 08/12/2019, 1:35 PM  Clinical Narrative:                 Pt wasn't taking any medications at home. She denies issues with transportation. TOC following for d/c needs.  Expected Discharge Plan: Home/Self Care Barriers to Discharge: Continued Medical Work up   Patient Goals and CMS Choice        Expected Discharge Plan and Services Expected Discharge Plan: Home/Self Care         Expected Discharge Date: 08/12/19                                    Prior Living Arrangements/Services   Lives with:: Self Patient language and need for interpreter reviewed:: Yes(no needs) Do you feel safe going back to the place where you live?: Yes      Need for Family Participation in Patient Care: No (Comment) Care giver support system in place?: Yes (comment)(children can provide intermittent supervision)   Criminal Activity/Legal Involvement Pertinent to Current Situation/Hospitalization: No - Comment as needed  Activities of Daily Living Home Assistive Devices/Equipment: Eyeglasses ADL Screening (condition at time of admission) Patient's cognitive ability adequate to safely complete daily activities?: Yes Is the patient deaf or have difficulty hearing?: No Does the patient have difficulty seeing, even when wearing glasses/contacts?: No Does the patient have difficulty concentrating, remembering, or making decisions?: No Patient able to express need for assistance with ADLs?: Yes Does the patient have difficulty dressing or bathing?: No Independently performs ADLs?: Yes (appropriate for developmental age) Does the patient have difficulty walking or climbing stairs?: No Weakness of Legs: None Weakness of Arms/Hands: None  Permission Sought/Granted                   Emotional Assessment Appearance:: Appears stated age Attitude/Demeanor/Rapport: Engaged Affect (typically observed): Accepting, Pleasant Orientation: : Oriented to Self, Oriented to  Time, Oriented to Situation, Oriented to Place   Psych Involvement: No (comment)  Admission diagnosis:  Ischemic stroke [facility]) [I63.9] Patient Active Problem List   Diagnosis Date Noted  . CVA (cerebral vascular accident) ([place]) 08/11/2019  . Acute CVA (cerebrovascular accident) ([place]) 06/14/2019  . HTN (hypertension) 06/14/2019  . Hypokalemia 06/14/2019  . H/O bilateral mastectomy 02/17/2019  . Rib pain on left side 09/18/2016  . Hiatal hernia 09/05/2016  . History of breast cancer in female-2000, 2003 03/17/2016   PCP:  [name], MD Pharmacy:   [facility] [place], [place] - [address] [place] [address] Phone: [phone] Fax: [phone]     Social Determinants of Health (SDOH) Interventions    Readmission Risk Interventions No flowsheet data found.

## 2019-08-12 NOTE — Evaluation (Signed)
Occupational Therapy Evaluation Patient Details Name: Connie Meyer MRN: [id number] DOB: September 30, 1951 Today's Date: 08/12/2019    History of Present Illness This 68 y.o. female admitted after having difficulty opening door, flushing toilet, using phone.  MRI showed scattered small cortical and subcortical infarcts in the Rt parietal lobe.  PMH includes: old small left occipital lobe infarct.     Clinical Impression   Patient evaluated by Occupational Therapy with no further acute OT needs identified. All education has been completed and the patient has no further questions. Pt appears back to baseline.  She is able to perform ADLs independently, and is able to perform moderately complex path finding task independently.  See below for any follow-up Occupational Therapy or equipment needs. OT is signing off. Thank you for this referral.      Follow Up Recommendations  No OT follow up    Equipment Recommendations  None recommended by OT    Recommendations for Other Services       Precautions / Restrictions Precautions Precautions: None      Mobility Bed Mobility Overal bed mobility: Independent                Transfers Overall transfer level: Independent                    Balance Overall balance assessment: Independent                                         ADL either performed or assessed with clinical judgement   ADL Overall ADL's : Independent                                             Vision Baseline Vision/History: Wears glasses Wears Glasses: At all times Patient Visual Report: No change from baseline Vision Assessment?: Yes Eye Alignment: Within Functional Limits Ocular Range of Motion: Within Functional Limits Alignment/Gaze Preference: Within Defined Limits Tracking/Visual Pursuits: Able to track stimulus in all quads without difficulty Saccades: Within functional limits Convergence: Within  functional limits Visual Fields: No apparent deficits Additional Comments: Pt initially with decreased ability to count fingers in Lt periphery, but with retesting x 2 she had no difficulty      Perception Perception Perception Tested?: Yes   Praxis Praxis Praxis tested?: Within functional limits    Pertinent Vitals/Pain Pain Assessment: No/denies pain     Hand Dominance Right   Extremity/Trunk Assessment Upper Extremity Assessment Upper Extremity Assessment: Overall WFL for tasks assessed   Lower Extremity Assessment Lower Extremity Assessment: Overall WFL for tasks assessed   Cervical / Trunk Assessment Cervical / Trunk Assessment: Normal   Communication Communication Communication: No difficulties   Cognition Arousal/Alertness: Awake/alert Behavior During Therapy: WFL for tasks assessed/performed Overall Cognitive Status: Within Functional Limits for tasks assessed                                 General Comments: Pt able to perform moderately complex path finding task using hospital signage independently    General Comments  Pt independent with BEFAST     Exercises     Shoulder Instructions      Home Living Family/patient expects to be discharged to::  Private residence Living Arrangements: Alone Available Help at Discharge: Family;Available PRN/intermittently Type of Home: House Home Access: Stairs to enter [employer] of Steps: 12   Home Layout: One level         Biochemist, clinical: Standard                Prior Functioning/Environment Level of Independence: Independent        Comments: Pt is a retired Programmer, systems Problem List: Decreased cognition      OT Treatment/Interventions:      OT Goals(Current goals can be found in the care plan section) Acute Rehab OT Goals Patient Stated Goal: to get this figured out  OT Goal Formulation: All assessment and education complete, DC therapy  OT  Frequency:     Barriers to D/C:            Co-evaluation              AM-PAC OT "6 Clicks" Daily Activity     Outcome Measure Help from another person eating meals?: None Help from another person taking care of personal grooming?: None Help from another person toileting, which includes using toliet, bedpan, or urinal?: None Help from another person bathing (including washing, rinsing, drying)?: None Help from another person to put on and taking off regular upper body clothing?: None Help from another person to put on and taking off regular lower body clothing?: None 6 Click Score: 24   End of Session    Activity Tolerance: Patient tolerated treatment well Patient left: in bed;with call bell/phone within reach  OT Visit Diagnosis: Cognitive communication deficit (R41.841) Symptoms and signs involving cognitive functions: Cerebral infarction                Time: 1151-1209 OT Time Calculation (min): 18 min Charges:  OT General Charges $OT Visit: 1 Visit OT Evaluation $OT Eval Low Complexity: 1 Low  Lucille Passy, OTR/L Acute Rehabilitation Services Pager [phone] Office [phone]   Lucille Passy M 08/12/2019, 12:54 PM

## 2019-08-12 NOTE — Evaluation (Signed)
Speech Language Pathology Evaluation Patient Details Name: Connie Meyer MRN: [id number] DOB: [birth year] Today's Date: 08/12/2019 Time: 0300-9233 SLP Time Calculation (min) (ACUTE ONLY): 25 min  Problem List:  Patient Active Problem List   Diagnosis Date Noted  . CVA (cerebral vascular accident) ([place]) 08/11/2019  . Acute CVA (cerebrovascular accident) ([place]) 06/14/2019  . HTN (hypertension) 06/14/2019  . Hypokalemia 06/14/2019  . H/O bilateral mastectomy 02/17/2019  . Rib pain on left side 09/18/2016  . Hiatal hernia 09/05/2016  . History of breast cancer in female-2000, 2003 03/17/2016   Past Medical History:  Past Medical History:  Diagnosis Date  . Cancer ([place])    bilateral breast  . HTN (hypertension)   . Stroke [facility])    Past Surgical History:  Past Surgical History:  Procedure Laterality Date  . BREAST SURGERY    . BUBBLE STUDY  06/16/2019   Procedure: BUBBLE STUDY;  Surgeon: Fay Records, MD;  Location: [facility] ENDOSCOPY;  Service: Cardiovascular;;  . LOOP RECORDER INSERTION N/A 06/16/2019   Procedure: LOOP RECORDER INSERTION;  Surgeon: Evans Lance, MD;  Location: [place] CV LAB;  Service: Cardiovascular;  Laterality: N/A;  . TEE WITHOUT CARDIOVERSION N/A 06/16/2019   Procedure: TRANSESOPHAGEAL ECHOCARDIOGRAM (TEE);  Surgeon: Fay Records, MD;  Location: [facility] ENDOSCOPY;  Service: Cardiovascular;  Laterality: N/A;   HPI:  68 yo female admitted 08/11/2019 with stroke symptoms. 2 days prior to admit, pt noted abormalities with ADLs. 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 Head CT = small (sub)cortical infarcts right parietal lobe, questionable basal ganglia infarct SLE completed in June 2020, with [facility] (29/30) no ST f/u recommended at that time.   Assessment / Plan / Recommendation Clinical Impression  Th Montreal Cognitive Assessment  (MoCA) was administered. Pt scored 27/30 (N=26+/30), indicating function within normal limits. Pt  speech and language appear intact in conversation. Pt was encouraged to contact PCP if difficulties arise after DC and return to normal routines. No further ST intervention recommended at this time.    SLP Assessment  SLP Recommendation/Assessment: Patient does not need any further Speech Language Pathology Services SLP Visit Diagnosis: Cognitive communication deficit (R41.841)    Follow Up Recommendations  None       SLP Evaluation Cognition  Overall Cognitive Status: Within Functional Limits for tasks assessed Arousal/Alertness: Awake/alert Orientation Level: Oriented X4 Attention: Focused;Sustained Focused Attention: Appears intact Sustained Attention: Appears intact Memory: Appears intact Awareness: Appears intact Problem Solving: Appears intact Executive Function: Reasoning Reasoning: Appears intact Safety/Judgment: Appears intact       Comprehension  Auditory Comprehension Overall Auditory Comprehension: Appears within functional limits for tasks assessed Yes/No Questions: Within Functional Limits Commands: Within Functional Limits Conversation: Complex    Expression Expression Primary Mode of Expression: Verbal Verbal Expression Overall Verbal Expression: Appears within functional limits for tasks assessed Initiation: No impairment Level of Generative/Spontaneous Verbalization: Conversation Repetition: No impairment Naming: No impairment Pragmatics: No impairment Non-Verbal Means of Communication: Not applicable Written Expression Dominant Hand: Right   Oral / Motor  Oral Motor/Sensory Function Overall Oral Motor/Sensory Function: Within functional limits Motor Speech Overall Motor Speech: Appears within functional limits for tasks assessed Respiration: Within functional limits Phonation: Normal Resonance: Within functional limits Articulation: Within functional limitis Intelligibility: Intelligible Motor Planning: Witnin functional limits Motor Speech  Errors: Not applicable   GO                   Connie Meyer, [place], [place] Speech Language Pathologist  [phone]  Connie Meyer 08/12/2019, 11:25 AM

## 2019-08-12 NOTE — Progress Notes (Signed)
PT Cancellation/Discharge Note  Patient Details Name: Connie Meyer MRN: [id number] DOB: September 21, 1951   Cancelled Treatment:    Reason Eval/Treat Not Completed: PT screened, no needs identified, will sign off.  OT screened for PT needs.  No needs identified at this time.  See OT note for details.  PT to sign off.  Thanks,  Barbarann Ehlers. Sanaii Caporaso, PT, DPT  Acute Rehabilitation [phone] pager [phone] office  @ [facility]: [phone]     Harvie Heck 08/12/2019, 2:20 PM

## 2019-08-12 NOTE — Interval H&P Note (Signed)
History and Physical Interval Note:  08/12/2019 1:28 PM  Connie Meyer  has presented today for surgery, with the diagnosis of stroke.  The various methods of treatment have been discussed with the patient and family. After consideration of risks, benefits and other options for treatment, the patient has consented to  Procedure(s): LOOP RECORDER INSERTION (N/A) as a surgical intervention.  The patient's history has been reviewed, patient examined, no change in status, stable for surgery.  I have reviewed the patient's chart and labs.  Questions were answered to the patient's satisfaction.     Thompson Grayer

## 2019-08-12 NOTE — H&P (View-Only) (Signed)
ELECTROPHYSIOLOGY CONSULT NOTE  Patient ID: Connie Meyer MRN: [id number], DOB/AGE: 69-Mar-1952   Admit date: 08/11/2019 Date of Consult: 08/12/2019  Primary Physician: [name], MD Electrophysiologist: Dr.Taylor, loop implant 06/16/2019 Reason for Consultation: Cryptogenic stroke ; recommendations regarding Implantable Loop Recorder, requested by Dr. Leonie Man  History of Present Illness Connie Meyer was admitted on 08/11/2019 with difficulty doing simple tasks, and found with recurrent stroke.  She first developed symptoms while at home.    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  Subsequently the patient noted she could no longer palpate the device, and in-clinic unable to communicate with loop.  CXR was done noting no sign of the device, unclear, though apparently device was expelled by some means after her wound check visit  At which time the wound appeared to have been healed.  She was planned for re-implant with Dr. Lovena Le of a [employer] device.   Neurology noted multiple small infarcts in the right posterior parietal lobe.  she had with her prior stroke undergone workup for stroke including echocardiogram and carotid dopplers and TEE.  Neurology NP has reached out to re-implant loop today with no need to repeat w/u.   06/14/2019 TTE   IMPRESSIONS  1. The left ventricle has normal systolic function, with an ejection fraction of 55-60%. The cavity size was normal. Left ventricular diastolic Doppler parameters are consistent with impaired relaxation. Indeterminate filling pressures The E/e' is 8-15.  2. The right ventricle has normal systolic function. The cavity was normal. There is no increase in right ventricular wall thickness.  3. The mitral valve is grossly normal.  4. The tricuspid valve is grossly normal.  5. The aortic valve is grossly  normal. No stenosis of the aortic valve.   06/16/2019 : TEE IMPRESSIONS 1. The left ventricle has normal systolic function, with an ejection fraction of 55-60%. The cavity size was normal.  2. The right ventricle has normal systolc function.  3. LA, LAA without masses.  4. With injection of agitated saline a few bubbles appeared late in left atrium consistent with intrapulmonary shunting (small).  5. The aortic valve is tricuspid Aortic valve regurgitation is trivial by color flow Doppler.  6. Minimal fixed plaquing in the thoracic aorta.   06/14/2019, Carotid US Summary: Right Carotid: Velocities in the right ICA are consistent with a 1-39% stenosis.  Left Carotid: Velocities in the left ICA are consistent with a 1-39% stenosis.  Vertebrals:  Bilateral vertebral arteries demonstrate antegrade flow. Subclavians: Normal flow hemodynamics were seen in bilateral subclavian              arteries.   Lab work is reviewed.    Prior to admission, the patient denies chest pain, shortness of breath, dizziness, or syncope.  She does feel palpitations, they are usual brief/fleeting sometimes weekly, sometimes more and less frequent.  She is recovered from herstroke with plans to home at discharge.    Past Medical History:  Diagnosis Date   Cancer [facility])    bilateral breast   HTN (hypertension)    Stroke [facility])      Surgical History:  Past Surgical History:  Procedure Laterality Date   BREAST SURGERY     BUBBLE STUDY  06/16/2019   Procedure: BUBBLE STUDY;  Surgeon: Fay Records, MD;  Location: [place];  Service: Cardiovascular;;   LOOP RECORDER INSERTION N/A 06/16/2019  Procedure: LOOP RECORDER INSERTION;  Surgeon: Evans Lance, MD;  Location: [place] CV LAB;  Service: Cardiovascular;  Laterality: N/A;   TEE WITHOUT CARDIOVERSION N/A 06/16/2019   Procedure: TRANSESOPHAGEAL ECHOCARDIOGRAM (TEE);  Surgeon: Fay Records, MD;  Location: [facility] ENDOSCOPY;  Service:  Cardiovascular;  Laterality: N/A;     Medications Prior to Admission  Medication Sig Dispense Refill Last Dose   amLODipine (NORVASC) 5 MG tablet Take 5 mg by mouth daily.   08/11/2019 at Unknown time   atorvastatin (LIPITOR) 40 MG tablet Take 1 tablet (40 mg total) by mouth daily at 6 PM. 30 tablet 1 08/10/2019 at Unknown time   bimatoprost (LATISSE) 0.03 % ophthalmic solution 1 application daily.   08/10/2019 at Unknown time   fluticasone (FLONASE) 50 MCG/ACT nasal spray Place 1 spray into both nostrils 2 (two) times daily. 16 g 6 Past Week at Unknown time   STUDY - [place] - apixaban 2.5mg  or placebo (PI - [place]) Take 2.5 mg by mouth 2 (two) times daily.   08/10/2019 at Unknown time   aspirin EC 81 MG EC tablet Take 1 tablet (81 mg total) by mouth daily. (Patient not taking: Reported on 07/23/2019) 30 tablet 0 Not Taking at Unknown time    Inpatient Medications:   amLODipine  5 mg Oral Daily   aspirin EC  81 mg Oral Daily   atorvastatin  40 mg Oral q1800   clopidogrel  75 mg Oral Daily   enoxaparin (LOVENOX) injection  40 mg Subcutaneous Q24H   fluticasone  1 spray Each Nare BID   latanoprost  1 drop Both Eyes QHS    Allergies: No Known Allergies  Social History   Socioeconomic History   Marital status: Divorced    Spouse name: Not on file   Number of children: 4   Years of education: Not on file   Highest education level: Not on file  Occupational History   Not on file  Social Needs   Financial resource strain: Not on file   Food insecurity    Worry: Not on file    Inability: Not on file   Transportation needs    Medical: Not on file    Non-medical: Not on file  Tobacco Use   Smoking status: Never Smoker   Smokeless tobacco: Never Used  Substance and Sexual Activity   Alcohol use: Yes    Alcohol/week: 1.0 standard drinks    Types: 1 Glasses of wine per week   Drug use: No   Sexual activity: Never  Lifestyle   Physical activity    Days  per week: Not on file    Minutes per session: Not on file   Stress: Not on file  Relationships   Social connections    Talks on phone: Not on file    Gets together: Not on file    Attends religious service: Not on file    Active member of club or organization: Not on file    Attends meetings of clubs or organizations: Not on file    Relationship status: Not on file   Intimate partner violence    Fear of current or ex partner: Not on file    Emotionally abused: Not on file    Physically abused: Not on file    Forced sexual activity: Not on file  Other Topics Concern   Not on file  Social History Narrative   Not on file     Family History  Problem Relation  Age of Onset   Hypertension Mother    Hypertension Father       Review of Systems: All other systems reviewed and are otherwise negative except as noted above.  Physical Exam: Vitals:   08/11/19 1947 08/12/19 0017 08/12/19 0432 08/12/19 0756  BP: (!) 149/85 127/77 139/74 135/66  Pulse: 70 67 61 63  Resp: (!) 24 (!) 24 (!) 24 16  Temp: 97.7 F (36.5 C) 97.6 F (36.4 C) 97.6 F (36.4 C) 98.2 F (36.8 C)  TempSrc: Oral Oral Oral Oral  SpO2: 94% 94% 100% 99%  Weight:      Height:        GEN- The patient is well appearing, alert and oriented x 3 today.   Head- normocephalic, atraumatic Eyes-  Sclera clear, conjunctiva pink Ears- hearing intact Oropharynx- clear Neck- supple Lungs- CTA b/l, normal work of breathing Heart- RRR, no murmurs, rubs or gallops  GI- soft, NT, ND Extremities- no clubbing, cyanosis, or edema MS- no significant deformity or atrophy Skin- no rash or lesion Psych- euthymic mood, full affect   Labs:   Lab Results  Component Value Date   WBC 4.3 08/11/2019   HGB 12.6 08/11/2019   HCT 37.0 08/11/2019   MCV 100.3 (H) 08/11/2019   PLT 170 08/11/2019    Recent Labs  Lab 08/11/19 0949 08/11/19 0957  NA 142 144  K 3.7 3.4*  CL 108 107  CO2 27  --   BUN 9 10  CREATININE  0.80 0.70  CALCIUM 9.2  --   PROT 7.1  --   BILITOT 0.8  --   ALKPHOS 75  --   ALT 19  --   AST 21  --   GLUCOSE 99 92   Lab Results  Component Value Date   CKTOTAL 122 06/14/2019   Lab Results  Component Value Date   CHOL 133 08/12/2019   CHOL 184 06/14/2019   CHOL 176 02/17/2019   Lab Results  Component Value Date   HDL 52 08/12/2019   HDL 64 06/14/2019   HDL 62 02/17/2019   Lab Results  Component Value Date   LDLCALC 67 08/12/2019   LDLCALC 107 (H) 06/14/2019   LDLCALC 100 (H) 02/17/2019   Lab Results  Component Value Date   TRIG 72 08/12/2019   TRIG 67 06/14/2019   TRIG 69 02/17/2019   Lab Results  Component Value Date   CHOLHDL 2.6 08/12/2019   CHOLHDL 2.9 06/14/2019   CHOLHDL 2.8 02/17/2019   No results found for: LDLDIRECT  No results found for: DDIMER   Radiology/Studies:   Ct Head Wo Contrast Result Date: 08/11/2019 CLINICAL DATA:  Difficulty remembering how to do simple tasks, history stroke, hypertension, breast cancer EXAM: CT HEAD WITHOUT CONTRAST TECHNIQUE: Contiguous axial images were obtained from the base of the skull through the vertex without intravenous contrast. COMPARISON:  None Correlation: CT head 06/14/2019 FINDINGS: Brain: Normal ventricular morphology. No midline shift or mass effect. Mild small vessel chronic ischemic changes of deep cerebral white matter. Small old deep infarct in LEFT cerebellar hemisphere. Question tiny lacunar infarct in RIGHT basal ganglia. No intracranial hemorrhage, mass lesion, or evidence of additional infarction. No extra-axial fluid collections. Vascular: No hyperdense vessels Skull: Intact Sinuses/Orbits: Clear Other: N/A IMPRESSION: Mild small vessel chronic ischemic changes of deep cerebral white matter. Question tiny age indeterminate lacunar infarct RIGHT basal ganglia. Small old LEFT cerebellar infarct. No intracranial hemorrhage. Electronically Signed   By: Crist Infante.D.  On: 08/11/2019 10:59    Mr  Brain Wo Contrast Result Date: 08/11/2019 CLINICAL DATA:  Transient difficulty with tasks EXAM: MRI HEAD WITHOUT CONTRAST TECHNIQUE: Multiplanar, multiecho pulse sequences of the brain and surrounding structures were obtained without intravenous contrast. COMPARISON:  CT head 08/11/2019.  MRI head 06/13/2019 FINDINGS: Brain: Scattered small cortical subcortical infarcts in the right parietal lobe. Chronic microvascular ischemic change in the white matter, similar to the prior MRI. Ventricle size normal. Negative for hemorrhage or mass. Vascular: Normal arterial flow voids. Skull and upper cervical spine: Negative Sinuses/Orbits: Mild mucosal edema paranasal sinuses. Bilateral mastoid effusion. Bilateral cataract surgery Other: None IMPRESSION: Scattered small cortical and subcortical infarcts in the right parietal lobe. Negative for hemorrhage Mild chronic microvascular ischemic change in the white matter stable since the prior MRI. Prior MRI demonstrated a small acute infarct left occipital lobe which has resolved. Consider cerebral emboli. Electronically Signed   By: Franchot Gallo M.D.   On: 08/11/2019 13:55    12-lead ECG SR All prior EKG's in EPIC reviewed with no documented atrial fibrillation  Telemetry SR, occ PACs, PVCs  Assessment and Plan:  1. Cryptogenic stroke The patient presents with cryptogenic stroke.  I have revisited loop implant rational with the patient, she very much wants to proceed, especially now after another stroke.  Risks, benefits, and alteratives to implantable loop recorder were discussed with the patient today.    Wound care was reviewed with the patient (keep incision clean and dry for 3 days).  Wound check will be scheduled for the patient.  Please call with questions.   Renee Dyane Dustman, PA-C 08/12/2019   I have seen, examined the patient, and reviewed the above assessment and plan.  Changes to above are made where necessary.  On exam, RRR.  Pt with prior ILR  that dehisced and is no longer implanted.  I agree with neurology that long term monitoring would be beneficial in further evaluation of her recurrent cryptogenic strokes. Risks and benefits to ILR were discussed with the patient who wishes to proceed.  Co Sign: Thompson Grayer, MD 08/12/2019 1:25 PM

## 2019-08-12 NOTE — Discharge Instructions (Signed)
Implant site/wound care instructions °Keep incision clean and dry for 3 days. °You can remove outer dressing tomorrow. °Leave steri-strips (little pieces of tape) on until seen in the office for wound check appointment. °Call the office ([phone]) for redness, drainage, swelling, or fever. ° °

## 2019-08-12 NOTE — Discharge Summary (Addendum)
Physician Discharge Summary  Connie Meyer [id number] DOB: 1951-10-08 DOA: 08/11/2019  PCP: [name], MD  Admit date: 08/11/2019 Discharge date: 08/12/2019  Admitted From: Home  Disposition:  Home   Recommendations for Outpatient Follow-up and new medication changes:  1. Follow up with Dr. Pamella Pert in 7 days.  2. Patient had a new loop recorder implanted. 3. Study drug has been discontinued and patient has been placed on apixaban.  Home Health: no   Equipment/Devices: no    Discharge Condition: stable  CODE STATUS: full  Diet recommendation: heart healthy   Brief/Interim Summary: 68 year old female who presented with altered mentation.  She does have significant past medical history for bilateral breast cancer, hypertension and cryptogenic stroke.  Patient reported 2 days of apraxia, that prompted her to come to the hospital.  On her initial physical examination blood pressure 134/68, heart rate 69, temperature 98.4, respiratory rate 20, oxygen saturation 100%.  She was nonfocal, lungs clear to auscultation bilaterally, heart S1-S2 present and rhythmic, abdomen soft, no lower extremity edema. Sodium 142, potassium 3.7, chloride 108, bicarb 27, glucose 99, BUN 9, creatinine 0.8, white count 4.3, hemoglobin 12.0, hematocrit 37.5, platelets 170.  SARS COVID-19 was negative.  Head CT showed mild small vessel chronic ischemic changes deep cerebral white matter.  Question small age-indeterminate lacunar infarct right basal ganglia, small old left cerebellar infarct.  No intracranial hemorrhage.  EKG 71 bpm, normal axis, normal intervals, sinus rhythm, no ST segment or T wave changes.  Patient was admitted to the hospital working diagnosis of focal neurologic deficit, rule out acute CVA.  1. Small cortical and subcortical infarcts in the right parietal lobe. Patient was admitted to the medical ward, placed on a remote telemetry monitor, had frequent neuro checks and further work  up with brain MRI which showed small cortical and subcortical infarcts in the right parietal lobe.  Her neurologic deficit improved, she was seen by neurology, recommendation for anticoagulation with apixaban. She was under the [place] study (apixaban 2.5 mg versus placebo), her study drug has now has been discontinued.  Continue hypotensive statin.  LDL at 67, hemoglobin A1c 5.5.   2.  Hypertension.  Continue blood pressure control with amlodipine.  3.  Dyslipidemia.  Continue atorvastatin.  4.  Chronic sinusitis.  Continue nasal fluticasone.  5. Bilateral breast cancer. She has been in remission for about 20 years. Questionable patient having hypercoagulable state with recurrent CVAs, will have Dr. Pamella Pert follow up as outpatient to consider if it is indicated, CT of chest, abdomen and pelvis for cancer recurrence screening. Noting all the possible outcomes from further imaging.   Discharge Diagnoses:  Principal Problem:   CVA (cerebral vascular accident) [facility]) Active Problems:   History of breast cancer in female-2000, 2003   H/O bilateral mastectomy   Acute CVA (cerebrovascular accident) ([place])   HTN (hypertension)   Dyslipidemia    Discharge Instructions   Allergies as of 08/12/2019   No Known Allergies     Medication List    STOP taking these medications   aspirin 81 MG EC tablet   STUDY - [place]CADIA - APIXABAN 2.5MG  OR PLACEBO Replaced by: apixaban 5 MG Tabs tablet     TAKE these medications   amLODipine 5 MG tablet Commonly known as: NORVASC Take 5 mg by mouth daily.   apixaban 5 MG Tabs tablet Commonly known as: ELIQUIS Take 1 tablet (5 mg total) by mouth 2 (two) times daily. Replaces: STUDY - [place] - APIXABAN 2.5MG   OR PLACEBO   atorvastatin 40 MG tablet Commonly known as: LIPITOR Take 1 tablet (40 mg total) by mouth daily at 6 PM.   bimatoprost 0.03 % ophthalmic solution Commonly known as: LATISSE 1 application daily.   fluticasone 50 MCG/ACT nasal  spray Commonly known as: FLONASE Place 1 spray into both nostrils 2 (two) times daily.      Follow-up Information    Baldwin Jamaica, PA-C Follow up.   Specialty: Cardiology Why: 9/10202 @ 11;45AM, wound check and f/u visit Contact information: [place] [address] [phone]          No Known Allergies  Consultations:  Neurology    Procedures/Studies: Dg Chest 2 View  Result Date: 07/16/2019 CLINICAL DATA:  Assess loop recorder location. EXAM: CHEST - 2 VIEW COMPARISON:  August 30, 2016 FINDINGS: Scarring is seen in the apices. Haziness over the bases consistent with overlapping soft tissues. No nodules or masses. No focal infiltrates. Surgical clips are seen bilaterally, stable. No loop recorder is identified. IMPRESSION: No loop recorder is identified. Electronically Signed   By: Dorise Bullion III M.D   On: 07/16/2019 09:37   Ct Head Wo Contrast  Result Date: 08/11/2019 CLINICAL DATA:  Difficulty remembering how to do simple tasks, history stroke, hypertension, breast cancer EXAM: CT HEAD WITHOUT CONTRAST TECHNIQUE: Contiguous axial images were obtained from the base of the skull through the vertex without intravenous contrast. COMPARISON:  None Correlation: CT head 06/14/2019 FINDINGS: Brain: Normal ventricular morphology. No midline shift or mass effect. Mild small vessel chronic ischemic changes of deep cerebral white matter. Small old deep infarct in LEFT cerebellar hemisphere. Question tiny lacunar infarct in RIGHT basal ganglia. No intracranial hemorrhage, mass lesion, or evidence of additional infarction. No extra-axial fluid collections. Vascular: No hyperdense vessels Skull: Intact Sinuses/Orbits: Clear Other: N/A IMPRESSION: Mild small vessel chronic ischemic changes of deep cerebral white matter. Question tiny age indeterminate lacunar infarct RIGHT basal ganglia. Small old LEFT cerebellar infarct. No intracranial hemorrhage.  Electronically Signed   By: Lavonia Dana M.D.   On: 08/11/2019 10:59   Mr Brain Wo Contrast  Result Date: 08/11/2019 CLINICAL DATA:  Transient difficulty with tasks EXAM: MRI HEAD WITHOUT CONTRAST TECHNIQUE: Multiplanar, multiecho pulse sequences of the brain and surrounding structures were obtained without intravenous contrast. COMPARISON:  CT head 08/11/2019.  MRI head 06/13/2019 FINDINGS: Brain: Scattered small cortical subcortical infarcts in the right parietal lobe. Chronic microvascular ischemic change in the white matter, similar to the prior MRI. Ventricle size normal. Negative for hemorrhage or mass. Vascular: Normal arterial flow voids. Skull and upper cervical spine: Negative Sinuses/Orbits: Mild mucosal edema paranasal sinuses. Bilateral mastoid effusion. Bilateral cataract surgery Other: None IMPRESSION: Scattered small cortical and subcortical infarcts in the right parietal lobe. Negative for hemorrhage Mild chronic microvascular ischemic change in the white matter stable since the prior MRI. Prior MRI demonstrated a small acute infarct left occipital lobe which has resolved. Consider cerebral emboli. Electronically Signed   By: Franchot Gallo M.D.   On: 08/11/2019 13:55      Procedures:   Subjective: Patient is feeling better, no further neurologic deficit, no nausea or vomiting, no chest pain or dyspnea.   Discharge Exam: Vitals:   08/12/19 0756 08/12/19 1139  BP: 135/66 136/74  Pulse: 63 69  Resp: 16 17  Temp: 98.2 F (36.8 C) 98.4 F (36.9 C)  SpO2: 99% 99%   Vitals:   08/12/19 0017 08/12/19 0432 08/12/19 0756  08/12/19 1139  BP: 127/77 139/74 135/66 136/74  Pulse: 67 61 63 69  Resp: (!) 24 (!) 24 16 17   Temp: 97.6 F (36.4 C) 97.6 F (36.4 C) 98.2 F (36.8 C) 98.4 F (36.9 C)  TempSrc: Oral Oral Oral Oral  SpO2: 94% 100% 99% 99%  Weight:      Height:        General: Not in pain or dyspnea  Neurology: Awake and alert, non focal  E ENT: no pallor, no icterus,  oral mucosa moist Cardiovascular: No JVD. S1-S2 present, rhythmic, no gallops, rubs, or murmurs. No lower extremity edema. Pulmonary:  positive breath sounds bilaterally, adequate air movement, no wheezing, rhonchi or rales. Gastrointestinal. Abdomen with, no organomegaly, non tender, no rebound or guarding Skin. No rashes Musculoskeletal: no joint deformities   The results of significant diagnostics from this hospitalization (including imaging, microbiology, ancillary and laboratory) are listed below for reference.     Microbiology: Recent Results (from the past 240 hour(s))  SARS CORONAVIRUS 2 Nasal Swab Aptima Multi Swab     Status: None   Collection Time: 08/11/19  3:56 PM   Specimen: Aptima Multi Swab; Nasal Swab  Result Value Ref Range Status   SARS Coronavirus 2 NEGATIVE NEGATIVE Final    Comment: (NOTE) SARS-CoV-2 target nucleic acids are NOT DETECTED. The SARS-CoV-2 RNA is generally detectable in upper and lower respiratory specimens during the acute phase of infection. Negative results do not preclude SARS-CoV-2 infection, do not rule out co-infections with other pathogens, and should not be used as the sole basis for treatment or other patient management decisions. Negative results must be combined with clinical observations, patient history, and epidemiological information. The expected result is Negative. Fact Sheet for Patients: SugarRoll.be Fact Sheet for Healthcare Providers: https://www.woods-mathews.com/ This test is not yet approved or cleared by the [place] FDA and  has been authorized for detection and/or diagnosis of SARS-CoV-2 by FDA under an Emergency Use Authorization (EUA). This EUA will remain  in effect (meaning this test can be used) for the duration of the COVID-19 declaration under Section 56 4(b)(1) of the Act, 21 U.S.C. section 360bbb-3(b)(1), unless the authorization is terminated or revoked  sooner. Performed at [facility], [place] [address] [place], [place] [address]      Labs: BNP (last 3 results) No results for input(s): BNP in the last 8760 hours. Basic Metabolic Panel: Recent Labs  Lab 08/11/19 0949 08/11/19 0957  NA 142 144  K 3.7 3.4*  CL 108 107  CO2 27  --   GLUCOSE 99 92  BUN 9 10  CREATININE 0.80 0.70  CALCIUM 9.2  --    Liver Function Tests: Recent Labs  Lab 08/11/19 0949  AST 21  ALT 19  ALKPHOS 75  BILITOT 0.8  PROT 7.1  ALBUMIN 4.2   No results for input(s): LIPASE, AMYLASE in the last 168 hours. No results for input(s): AMMONIA in the last 168 hours. CBC: Recent Labs  Lab 08/11/19 0949 08/11/19 0957  WBC 4.3  --   NEUTROABS 3.1  --   HGB 12.0 12.6  HCT 37.5 37.0  MCV 100.3*  --   PLT 170  --    Cardiac Enzymes: No results for input(s): CKTOTAL, CKMB, CKMBINDEX, TROPONINI in the last 168 hours. BNP: Invalid input(s): POCBNP CBG: No results for input(s): GLUCAP in the last 168 hours. D-Dimer No results for input(s): DDIMER in the last 72 hours. Hgb A1c Recent Labs  08/12/19 0350  HGBA1C 5.5   Lipid Profile Recent Labs    08/12/19 0350  CHOL 133  HDL 52  LDLCALC 67  TRIG 72  CHOLHDL 2.6   Thyroid function studies No results for input(s): TSH, T4TOTAL, T3FREE, THYROIDAB in the last 72 hours.  Invalid input(s): FREET3 Anemia work up No results for input(s): VITAMINB12, FOLATE, FERRITIN, TIBC, IRON, RETICCTPCT in the last 72 hours. Urinalysis    Component Value Date/Time   COLORURINE YELLOW 07/18/2017 1047   APPEARANCEUR CLEAR 07/18/2017 1047   LABSPEC 1.013 07/18/2017 1047   PHURINE 6.0 07/18/2017 1047   GLUCOSEU NEGATIVE 07/18/2017 1047   HGBUR SMALL (A) 07/18/2017 1047   BILIRUBINUR NEGATIVE 07/18/2017 1047   KETONESUR 5 (A) 07/18/2017 1047   PROTEINUR NEGATIVE 07/18/2017 1047   NITRITE NEGATIVE 07/18/2017 1047   LEUKOCYTESUR NEGATIVE 07/18/2017 1047   Sepsis Labs Invalid input(s):  PROCALCITONIN,  WBC,  LACTICIDVEN Microbiology Recent Results (from the past 240 hour(s))  SARS CORONAVIRUS 2 Nasal Swab Aptima Multi Swab     Status: None   Collection Time: 08/11/19  3:56 PM   Specimen: Aptima Multi Swab; Nasal Swab  Result Value Ref Range Status   SARS Coronavirus 2 NEGATIVE NEGATIVE Final    Comment: (NOTE) SARS-CoV-2 target nucleic acids are NOT DETECTED. The SARS-CoV-2 RNA is generally detectable in upper and lower respiratory specimens during the acute phase of infection. Negative results do not preclude SARS-CoV-2 infection, do not rule out co-infections with other pathogens, and should not be used as the sole basis for treatment or other patient management decisions. Negative results must be combined with clinical observations, patient history, and epidemiological information. The expected result is Negative. Fact Sheet for Patients: SugarRoll.be Fact Sheet for Healthcare Providers: https://www.woods-mathews.com/ This test is not yet approved or cleared by the [place] FDA and  has been authorized for detection and/or diagnosis of SARS-CoV-2 by FDA under an Emergency Use Authorization (EUA). This EUA will remain  in effect (meaning this test can be used) for the duration of the COVID-19 declaration under Section 56 4(b)(1) of the Act, 21 U.S.C. section 360bbb-3(b)(1), unless the authorization is terminated or revoked sooner. Performed at [facility], [place] [address] [place], [place] [address]      Time coordinating discharge: 45 minutes  SIGNED:   Tawni Millers, MD  [facility] 08/12/2019, 2:02 PM

## 2019-08-12 NOTE — Progress Notes (Signed)
Pt given discharge summary and discharge home via son as transportation.

## 2019-08-13 ENCOUNTER — Encounter (HOSPITAL_COMMUNITY): Payer: Self-pay | Admitting: Internal Medicine

## 2019-08-15 ENCOUNTER — Telehealth: Payer: Self-pay

## 2019-08-15 NOTE — Telephone Encounter (Signed)
Attempted outreach to schedule Pt for loop implant.  Pt was hospitalized 08/11/2019 and loop reimplanted during that hospitalization.  No action needed.

## 2019-08-15 NOTE — Telephone Encounter (Signed)
Left message for patient to regarding disconnected monitor 

## 2019-08-15 NOTE — Telephone Encounter (Signed)
Per Connie Meyer from [employer] the pt do not have to do anything to send a manual transmission. The pt should just sleep by her monitor and the monitor will do all the work. I will check in [facility] Monday to see if I see an initial transmission. If I do not I will give Connie Meyer a call.

## 2019-08-18 NOTE — Telephone Encounter (Signed)
I spoke with the pt and she was able to get the app set up. Last data sent 08-18-2019

## 2019-08-24 NOTE — Progress Notes (Signed)
Cardiology Office Note Date:  08/26/2019  Patient ID:  Connie Meyer, [place] [birth year], MRN [id number] PCP:  [name], MD  Cardiologist:  Dr. Rayann Heman     Chief Complaint: loop wound check  History of Present Illness: Connie Meyer is a 68 y.o. female with history of HTN, breast cancer, and cryptogenic stroke.  She suffered cryptogenic stroke June 2020, at that stay she had loop recorder implanted > strangely the loop apparently migrated out and was lost.  Unfortunately she had a second cryptogenic stroke 08/12/2019 and another loop was implanted though inferiorly noting that the skin superior to breasts was quite thin/fargil 2/2 radiation therapy.  Ultimately neurology decided to stop her enrollment in [place] study (apixaban 2.5 mg versus placebo) and started her on Eliquis.   Site looks great, no issues or concerns.  She is doing well, having trouble emotionally given yet another stroke, she is worried about getting back to her exercise (and life).  Outside if this, she is doing well without any symptoms.    She is encouraged to have follow up with neurology and her PMD to discuss her fears regarding resuming exercise, and likely some degree of depression for their recommendations and guidance.  SHe has noticed some mild bleeding of her guums Device information: MDT ILR, implanted 8/182020, cryptogenic stroke   Past Medical History:  Diagnosis Date  . Cancer ([facility])    bilateral breast  . HTN (hypertension)   . Stroke [facility])     Past Surgical History:  Procedure Laterality Date  . BREAST SURGERY    . BUBBLE STUDY  06/16/2019   Procedure: BUBBLE STUDY;  Surgeon: Fay Records, MD;  Location: [facility] ENDOSCOPY;  Service: Cardiovascular;;  . LOOP RECORDER INSERTION N/A 06/16/2019   Procedure: LOOP RECORDER INSERTION;  Surgeon: Evans Lance, MD;  Location: [place] CV LAB;  Service: Cardiovascular;  Laterality: N/A;  . LOOP RECORDER INSERTION N/A 08/12/2019   Procedure: LOOP RECORDER INSERTION;  Surgeon: Thompson Grayer, MD;  Location: [place] CV LAB;  Service: Cardiovascular;  Laterality: N/A;  . TEE WITHOUT CARDIOVERSION N/A 06/16/2019   Procedure: TRANSESOPHAGEAL ECHOCARDIOGRAM (TEE);  Surgeon: Fay Records, MD;  Location: [facility] ENDOSCOPY;  Service: Cardiovascular;  Laterality: N/A;    Current Outpatient Medications  Medication Sig Dispense Refill  . amLODipine (NORVASC) 5 MG tablet Take 5 mg by mouth daily.    [name] apixaban (ELIQUIS) 5 MG TABS tablet Take 1 tablet (5 mg total) by mouth 2 (two) times daily. 60 tablet 0  . atorvastatin (LIPITOR) 40 MG tablet Take 1 tablet (40 mg total) by mouth daily at 6 PM. 30 tablet 1  . bimatoprost (LATISSE) 0.03 % ophthalmic solution 1 application daily.    . fluticasone (FLONASE) 50 MCG/ACT nasal spray Place 1 spray into both nostrils 2 (two) times daily. 16 g 6   No current facility-administered medications for this visit.     Allergies:   Patient has no known allergies.   Social History:  The patient  reports that she has never smoked. She has never used smokeless tobacco. She reports current alcohol use of about 1.0 standard drinks of alcohol per week. She reports that she does not use drugs.   Family History:  The patient's family history includes Hypertension in her father and mother.  ROS:  Please see the history of present illness.   All other systems are reviewed and otherwise negative.     PHYSICAL EXAM:  VS:  BP  138/84   Pulse 98   Ht 5\' 6"  (1.676 m)   Wt 135 lb (61.2 kg)   BMI 21.79 kg/m  BMI: Body mass index is 21.79 kg/m. Well nourished, well developed, in no acute distress  HEENT: normocephalic, atraumatic  Neck: no JVD, carotid bruits or masses Cardiac:  RRR; no significant murmurs, no rubs, or gallops Lungs:  CTA b/l, no wheezing, rhonchi or rales  Abd: soft, nontender MS: no deformity or atrophy Ext: no edema  Skin: warm and dry, no rash Neuro:  No gross deficits  appreciated Psych: euthymic mood, full affect  ILR site (left, inframammary) is stable, well healed, no erythema or edema, no heat to the surrounding tissues.    EKG:  Not done today ILR interrogation done today and reviewed by myself: battery is good, SR today, no episodes   06/14/2019 TTE   IMPRESSIONS 1. The left ventricle has normal systolic function, with an ejection fraction of 55-60%. The cavity size was normal. Left ventricular diastolic Doppler parameters are consistent with impaired relaxation. Indeterminate filling pressures The E/e' is 8-15. 2. The right ventricle has normal systolic function. The cavity was normal. There is no increase in right ventricular wall thickness. 3. The mitral valve is grossly normal. 4. The tricuspid valve is grossly normal. 5. The aortic valve is grossly normal. No stenosis of the aortic valve.   06/16/2019 : TEE IMPRESSIONS 1. The left ventricle has normal systolic function, with an ejection fraction of 55-60%. The cavity size was normal. 2. The right ventricle has normal systolc function. 3. LA, LAA without masses. 4. With injection of agitated saline a few bubbles appeared late in left atrium consistent with intrapulmonary shunting (small). 5. The aortic valve is tricuspid Aortic valve regurgitation is trivial by color flow Doppler. 6. Minimal fixed plaquing in the thoracic aorta.   06/14/2019, Carotid US Summary: Right Carotid: Velocities in the right ICA are consistent with a 1-39% stenosis.  Left Carotid: Velocities in the left ICA are consistent with a 1-39% stenosis.  Vertebrals: Bilateral vertebral arteries demonstrate antegrade flow. Subclavians: Normal flow hemodynamics were seen in bilateral subclavian arteries.  Recent Labs: 06/14/2019: Magnesium 2.1 07/07/2019: TSH 2.440 08/11/2019: ALT 19; BUN 10; Creatinine, Ser 0.70; Hemoglobin 12.6; Platelets 170; Potassium 3.4; Sodium 144  08/12/2019:  Cholesterol 133; HDL 52; LDL Cholesterol 67; Total CHOL/HDL Ratio 2.6; Triglycerides 72; VLDL 14   Estimated Creatinine Clearance: 63 mL/min (by C-G formula based on SCr of 0.7 mg/dL).   Wt Readings from Last 3 Encounters:  08/26/19 135 lb (61.2 kg)  08/11/19 134 lb 14.7 oz (61.2 kg)  07/23/19 138 lb 9.6 oz (62.9 kg)     Other studies reviewed: Additional studies/records reviewed today include: summarized above  ASSESSMENT AND PLAN:  1. Cryptogenic strokes (recurrent) 2. ILR     No AFib to date     Neurology un-enrolled her from the study and started her on Eliquis at time of discharge    Disposition: F/u with monthly loop transmission, in clinic with EP as needed.   Current medicines are reviewed at length with the patient today.  The patient did not have any concerns regarding medicines.  Venetia Night, PA-C 08/26/2019 12:05 PM     [place] [address] [phone] (office)  [phone] (fax)

## 2019-08-25 ENCOUNTER — Other Ambulatory Visit: Payer: Self-pay

## 2019-08-25 ENCOUNTER — Encounter: Payer: Medicare Other | Admitting: *Deleted

## 2019-08-25 NOTE — Patient Outreach (Signed)
[facility]) Care Management  08/25/2019  Connie Meyer 12/13/1951 [id number]  EMMI: stroke red alert Referral date: 08/25/19 Referral reason: sad/ hopeless/ anxious/ empty: yes Insurance:  Medicare Day # 9  Outreach attempt # 1  Telephone call to patient regarding EMMI stroke red alert. Unable to reach patient. HIPAA compliant voice message left with call back phone number,.    PLAN:  RNCM will attempt 2nd telephone call to patient within 4 business days.  RNCM will send outreach letter / know before you go sheet  to patient to attempt contact   Quinn Plowman RN,BSN,CCM [facility] Telephonic  [phone]

## 2019-08-26 ENCOUNTER — Other Ambulatory Visit: Payer: Self-pay

## 2019-08-26 ENCOUNTER — Encounter: Payer: Self-pay | Admitting: Physician Assistant

## 2019-08-26 ENCOUNTER — Ambulatory Visit (INDEPENDENT_AMBULATORY_CARE_PROVIDER_SITE_OTHER): Payer: Medicare Other | Admitting: Physician Assistant

## 2019-08-26 VITALS — BP 138/84 | HR 98 | Ht 66.0 in | Wt 135.0 lb

## 2019-08-26 DIAGNOSIS — Z4509 Encounter for adjustment and management of other cardiac device: Secondary | ICD-10-CM

## 2019-08-26 DIAGNOSIS — I639 Cerebral infarction, unspecified: Secondary | ICD-10-CM | POA: Diagnosis not present

## 2019-08-26 DIAGNOSIS — Z5189 Encounter for other specified aftercare: Secondary | ICD-10-CM

## 2019-08-26 NOTE — Patient Outreach (Signed)
[facility]) Care Management  08/26/2019  Connie Meyer Jan 07, 1951 [id number]   EMMI: stroke red alert Referral date: 08/25/19 Referral reason: sad/ hopeless/ anxious/ empty: yes Insurance:  Medicare Day # 9  Telephone call to patient regarding EMMI stroke red alert. HIPAA verified with patient. RNCM explained reason for call. Patient states she has been feeling depressed and sad due to this recent hospitalization and this is her 3rd stroke. Patient states she is not on any medication for anxiety or depression. Patient tearful during conversation with RNCM.  She states she has not contacted her primary MD office for a post hospital discharge follow up. RNCM discussed with patient importance of having post hospital discharge follow up appointment with provider. Advised patient to call and schedule appointment.  RNCM discussed and offered [facility] care management services. Patient declined stating she would like to talk with her doctor first regarding her depression symptoms. RNCM informed patient she would notify her doctor of the depression screening results. Patient verbalized agreement and understanding.  RNCM provided patient with contact phone number for [facility] care management.  Patient denied any further needs at this point. RNCM advised patient to notify MD of any changes in condition prior to scheduled appointment. RNCM provided contact name and number: [phone] or main office number [phone] and 24 hour nurse advise line [phone] by mail.  RNCM verified patient aware of 911 services for urgent/ emergent needs.  PLAN; RNCM will follow up with patient within 4 business days. RNCM will in basket patient's primary depression screening results.   Quinn Plowman RN,BSN,CCM [facility] Telephonic  [phone]

## 2019-08-26 NOTE — Patient Instructions (Signed)
Medication Instructions:  Your physician recommends that you continue on your current medications as directed. Please refer to the Current Medication list given to you today.  If you need a refill on your cardiac medications before your next appointment, please call your pharmacy.   Lab work: NONE ORDERED  TODAY   If you have labs (blood work) drawn today and your tests are completely normal, you will receive your results only by: [name] MyChart Message (if you have MyChart) OR . A paper copy in the mail If you have any lab test that is abnormal or we need to change your treatment, we will call you to review the results.  Testing/Procedures: NONE ORDERED  TODAY  Follow-Up: At [facility], you and your health needs are our priority.  As part of our continuing mission to provide you with exceptional heart care, we have created designated Provider Care Teams.  These Care Teams include your primary Cardiologist (physician) and Advanced Practice Providers (APPs -  Physician Assistants and Nurse Practitioners) who all work together to provide you with the care you need, when you need it. You may see one of the following Advanced Practice Providers on your designated Care Team:   Chanetta Marshall, NP . Tommye Standard, PA-C AS NEEDED FOR  ANY CARDIAC RELATED SYMPTOMS CONTACT [facility] HEART CARE 336 [phone]   Any Other Special Instructions Will Be Listed Below (If Applicable).

## 2019-08-27 ENCOUNTER — Other Ambulatory Visit: Payer: Self-pay | Admitting: Physician Assistant

## 2019-08-27 ENCOUNTER — Telehealth: Payer: Self-pay | Admitting: Family Medicine

## 2019-08-27 MED ORDER — ATORVASTATIN CALCIUM 40 MG PO TABS: 40.0000 mg | ORAL_TABLET | Freq: Every day | ORAL | 3 refills | Status: DC

## 2019-08-27 NOTE — Telephone Encounter (Signed)
LVM to schedule hospital f/u in 1 to 2 weeks per Dr. Pamella Pert

## 2019-08-27 NOTE — Telephone Encounter (Signed)
Pt's medication was sent to pt's pharmacy as requested. Confirmation received.  °

## 2019-08-28 ENCOUNTER — Other Ambulatory Visit: Payer: Self-pay

## 2019-08-28 NOTE — Patient Outreach (Signed)
[facility]) Care Management  08/28/2019  KIMELA ADCOX 1951/03/20 [id number]  EMMI:stroke red alert Referral date:08/25/19 Referral reason:sad/ hopeless/ anxious/ empty: yes Insurance: Medicare Day #9  Telephone call to patient regarding EMMI stroke red alert. Unable to reach patient. HIPAA compliant voice message left with call back phone number.   PLAN:  RNCM will attempt 2nd telephone call to patient within 4 business days.  RNCM will send patient outreach letter to attempt contact.   Quinn Plowman RN,BSN,CCM [facility] Telephonic  [phone]

## 2019-08-29 ENCOUNTER — Telehealth: Payer: Medicare Other | Admitting: Family Medicine

## 2019-08-29 ENCOUNTER — Other Ambulatory Visit: Payer: Self-pay

## 2019-08-29 ENCOUNTER — Ambulatory Visit: Payer: Self-pay

## 2019-09-02 ENCOUNTER — Encounter: Payer: Self-pay | Admitting: Family Medicine

## 2019-09-02 ENCOUNTER — Other Ambulatory Visit: Payer: Self-pay

## 2019-09-02 NOTE — Patient Outreach (Signed)
[facility]) Care Management  09/02/2019  MAISLYN MICHIE 01-04-1951 [id number]   EMMI:stroke red alert Referral date:08/25/19 Referral reason:sad/ hopeless/ anxious/ empty: yes Insurance: Medicare Day #9  Telephone call to patient for EMMI stroke follow up. HIPAA verified with patient. Patient reports she was scheduled to have a virtual phone appointment with her primary MD on 08/29/2019 at 5:00pm but states she did not received.  RNCM informed patient that per chart review of her appointment status message states "cell was not receiving calls."  RNCM informed patient she will call patients primary MD office and assist with rescheduling appointment. Patient voiced appreciation.  RNCM attempted to contact patients primary MD office x 2.  Automated message states," We are currently experiencing system problems and are unable to process your call."   PLAN; RNCM will attempt follow up call with patients primary MD office and patient within 4 business days.   Quinn Plowman RN,BSN,CCM [facility] Telephonic  [phone]

## 2019-09-03 ENCOUNTER — Other Ambulatory Visit: Payer: Self-pay

## 2019-09-03 NOTE — Patient Outreach (Signed)
[place] [facility]) Care Management  09/03/2019  TKIYAH REICHMAN 12-Jun-1951 [id number]  EMMI:stroke red alert Referral date:08/25/19 Referral reason:sad/ hopeless/ anxious/ empty: yes Insurance: Medicare Day #9  Telephone call to patients primary MD to assist patient with rescheduling post hospital discharge follow up visit.  RNCM spoke with Sherron.  Follow up appointment rescheduled for patient for 09/05/19 at 5:20pm.  RNCM informed Sherron she would call and inform patient of appointment.   RNCM attempted call to patient. Unable to reach. HIPAA compliant voice message left with call back phone number.   PLAN; RNCM will attempt 2nd telephone call to patient within 4 business days.  RNCM will send outreach letter to attempt contact.   Quinn Plowman RN,BSN,CCM [facility] Telephonic  [phone]

## 2019-09-04 ENCOUNTER — Other Ambulatory Visit: Payer: Self-pay

## 2019-09-04 NOTE — Patient Outreach (Signed)
[place] [facility]) Care Management  09/04/2019  Connie Meyer 10-20-1951 [id number]  EMMI:stroke red alert Referral date:08/25/19 Referral reason:sad/ hopeless/ anxious/ empty: yes Insurance: Medicare Day #9  Outreach attempt #2  Telephone call to patient regarding EMMI stroke follow up. Unable to reach patient. HIPAA compliant voice message left with call back number.   PLAN; RNCM will attempt 3rd telephone call to patient within 4 business days.  RNCM will send patient outreach letter to attempt contact.   Quinn Plowman RN,BSN,CCM [facility] Telephonic  [phone]

## 2019-09-05 ENCOUNTER — Other Ambulatory Visit: Payer: Self-pay

## 2019-09-05 ENCOUNTER — Telehealth (INDEPENDENT_AMBULATORY_CARE_PROVIDER_SITE_OTHER): Payer: Medicare Other | Admitting: Family Medicine

## 2019-09-05 DIAGNOSIS — G4700 Insomnia, unspecified: Secondary | ICD-10-CM | POA: Diagnosis not present

## 2019-09-05 DIAGNOSIS — I6930 Unspecified sequelae of cerebral infarction: Secondary | ICD-10-CM | POA: Diagnosis not present

## 2019-09-05 DIAGNOSIS — F4323 Adjustment disorder with mixed anxiety and depressed mood: Secondary | ICD-10-CM

## 2019-09-05 DIAGNOSIS — I6300 Cerebral infarction due to thrombosis of unspecified precerebral artery: Secondary | ICD-10-CM

## 2019-09-05 MED ORDER — ZOLPIDEM TARTRATE 5 MG PO TABS: 5.0000 mg | ORAL_TABLET | Freq: Every evening | ORAL | 0 refills | Status: DC | PRN

## 2019-09-05 NOTE — Progress Notes (Signed)
Follow up for stroke. Says she has been feeling fatigue, depressed and anxious. Completed both gad 7 and phq9 screening. Gad 7 score is 16. Medication and pharmacy verified.

## 2019-09-05 NOTE — Patient Outreach (Signed)
[facility]) Care Management  09/05/2019  LAKSHMI LESEMAN 01-Sep-1951 [id number]   EMMI:stroke red alert Referral date:08/25/19 Referral reason:sad/ hopeless/ anxious/ empty: yes Insurance: Medicare Day #9  Care coordination:   Telephone call to patient for care coordination follow up. Contact answering phone states she is patients daughter and requested reason for call. RNCM informed contact that under HIPAA regulations she would have to speak with the patient who would then have to give verbal authorization to speak with daughter.   HIPAA compliant message left with call back phone number.    RNCM attempted 2nd telephone call to patient. Unable to reach patient. HIPAA compliant voice message left with call back phone number.    RNCM attempted 3rd telephone call to patient.  HIPAA verified with patient. RNCM informed patient she is scheduled to have a virtual health phone appointment with her primary MD this evening at 5:20pm.  Patient states," My phone ended up in the wash so that's why I was unable to answer it."  RNCM again informed patient of her 5:20pm appointment on 09/05/19 with her primary MD, Dr. Grant Fontana.  Patient verbalized understanding.    PLAN;  RNCM will follow up with patient at next scheduled outreach   Quinn Plowman RN,BSN,CCM [facility] Telephonic  [phone]

## 2019-09-05 NOTE — Progress Notes (Signed)
Virtual Visit Note  I connected with patient on 09/05/19 at 538pm by phone and verified that I am speaking with the correct person using two identifiers. Connie Meyer is currently located at home and patient is currently with them during visit. The provider, [name], MD is located in their office at time of visit.  I discussed the limitations, risks, security and privacy concerns of performing an evaluation and management service by telephone and the availability of in person appointments. I also discussed with the patient that there may be a patient responsible charge related to this service. The patient expressed understanding and agreed to proceed.   CC: depression and anxiety  HPI ? Has had several CVA for the past several months, most recent aug 2020 Most recent CVA she was part of a study, unsure if she was on placebo or anticoagulant, her loop recorder had fallen out Did yoga, went for a yoga and when she came home she could not figure out how to open the door Has had new loop recorder in place Patient currently on eliquis H/o breast cancer - declines any further investigation Patient denies any neurological sequela  since then having issues with depression and anxiety Not sleeping, eating ok Depression started prior to CVA, worse since CVA "I feel like a ticking time bomb" Sleep is really an issue In the past trazodone made her very groggy In the hospital tried [place] and did ok    No Known Allergies  Prior to Admission medications   Medication Sig Start Date End Date Taking? Authorizing Provider  amLODipine (NORVASC) 5 MG tablet Take 5 mg by mouth daily.   Yes [provider]  apixaban (ELIQUIS) 5 MG TABS tablet Take 1 tablet (5 mg total) by mouth 2 (two) times daily. 08/12/19 09/11/19 Yes Arrien, Jimmy Picket, MD  atorvastatin (LIPITOR) 40 MG tablet Take 1 tablet (40 mg total) by mouth daily at 6 PM. 08/27/19  Yes Baldwin Jamaica, PA-C   bimatoprost (LATISSE) 0.03 % ophthalmic solution 1 application daily. 07/25/19  Yes [provider]  fluticasone (FLONASE) 50 MCG/ACT nasal spray Place 1 spray into both nostrils 2 (two) times daily. 07/07/19  Yes [name], MD    Past Medical History:  Diagnosis Date  . Cancer ([place])    bilateral breast  . HTN (hypertension)   . Stroke [facility])     Past Surgical History:  Procedure Laterality Date  . BREAST SURGERY    . BUBBLE STUDY  06/16/2019   Procedure: BUBBLE STUDY;  Surgeon: Fay Records, MD;  Location: [facility] ENDOSCOPY;  Service: Cardiovascular;;  . LOOP RECORDER INSERTION N/A 06/16/2019   Procedure: LOOP RECORDER INSERTION;  Surgeon: Evans Lance, MD;  Location: [place] CV LAB;  Service: Cardiovascular;  Laterality: N/A;  . LOOP RECORDER INSERTION N/A 08/12/2019   Procedure: LOOP RECORDER INSERTION;  Surgeon: Thompson Grayer, MD;  Location: [place] CV LAB;  Service: Cardiovascular;  Laterality: N/A;  . TEE WITHOUT CARDIOVERSION N/A 06/16/2019   Procedure: TRANSESOPHAGEAL ECHOCARDIOGRAM (TEE);  Surgeon: Fay Records, MD;  Location: [facility] ENDOSCOPY;  Service: Cardiovascular;  Laterality: N/A;    Social History   Tobacco Use  . Smoking status: Never Smoker  . Smokeless tobacco: Never Used  Substance Use Topics  . Alcohol use: Yes    Alcohol/week: 1.0 standard drinks    Types: 1 Glasses of wine per week    Family History  Problem Relation Age of Onset  .  Hypertension Mother   . Hypertension Father     ROS Per hpi  Objective  Vitals as reported by the patient: none   ASSESSMENT and PLAN  1. Insomnia, unspecified type 2. Adjustment disorder with mixed anxiety and depressed mood 3. Cerebrovascular accident (CVA) due to thrombosis of precerebral artery [facility])  Patient with multiple CVAs in past several months. Feeling as she has no control. Afraid to exercise. Not sleeping at all. No SI. We discussed treatment options. She wants to focus on sleep for  now and see if mood follows. She tolerated ambien well in the hospital. pmp reviewed. Will start Lorrin Mais as it worked for her. reviewed r/se/b. Declines counseling and antidepressants at this time, open to them if needed.   Other orders - zolpidem (AMBIEN) 5 MG tablet; Take 1 tablet (5 mg total) by mouth at bedtime as needed for sleep.  FOLLOW-UP:  3 weeks   The above assessment and management plan was discussed with the patient. The patient verbalized understanding of and has agreed to the management plan. Patient is aware to call the clinic if symptoms persist or worsen. Patient is aware when to return to the clinic for a follow-up visit. Patient educated on when it is appropriate to go to the emergency department.    I provided 14 minutes of non-face-to-face time during this encounter.  [name], MD Primary Care at [facility] [place], [place] [address] Ph.  [phone] Fax [phone]

## 2019-09-08 ENCOUNTER — Ambulatory Visit: Payer: Self-pay

## 2019-09-11 ENCOUNTER — Other Ambulatory Visit: Payer: Self-pay

## 2019-09-11 NOTE — Patient Outreach (Signed)
[facility]) Care Management  09/11/2019  Connie Meyer Feb 28, 1951 [id number]  EMMI:stroke red alert Referral date:08/25/19 Referral reason:sad/ hopeless/ anxious/ empty: yes Insurance: Medicare Day #9  Outreach attempt #1  Telephone call to patient regarding EMMI stroke red alert. Unable to reach patient. HIPAA compliant voice message left with call back phone number.   PLAN:  RNCM will attempt 2nd telephone call to patient within 4 business days.  RNCM will send outreach letter to patient to attempt contact  Quinn Plowman RN,BSN,CCM [facility] Telephonic  [phone]

## 2019-09-15 ENCOUNTER — Encounter: Payer: Self-pay | Admitting: Family Medicine

## 2019-09-15 ENCOUNTER — Telehealth (INDEPENDENT_AMBULATORY_CARE_PROVIDER_SITE_OTHER): Payer: Medicare Other | Admitting: Family Medicine

## 2019-09-15 ENCOUNTER — Other Ambulatory Visit: Payer: Self-pay

## 2019-09-15 ENCOUNTER — Telehealth: Payer: Self-pay | Admitting: Adult Health

## 2019-09-15 VITALS — Ht 66.0 in | Wt 135.0 lb

## 2019-09-15 DIAGNOSIS — G4700 Insomnia, unspecified: Secondary | ICD-10-CM | POA: Diagnosis not present

## 2019-09-15 DIAGNOSIS — F4323 Adjustment disorder with mixed anxiety and depressed mood: Secondary | ICD-10-CM | POA: Diagnosis not present

## 2019-09-15 DIAGNOSIS — I6300 Cerebral infarction due to thrombosis of unspecified precerebral artery: Secondary | ICD-10-CM

## 2019-09-15 DIAGNOSIS — I6930 Unspecified sequelae of cerebral infarction: Secondary | ICD-10-CM

## 2019-09-15 HISTORY — DX: Adjustment disorder with mixed anxiety and depressed mood: F43.23

## 2019-09-15 HISTORY — DX: Insomnia, unspecified: G47.00

## 2019-09-15 MED ORDER — SERTRALINE HCL 50 MG PO TABS: 50.0000 mg | ORAL_TABLET | Freq: Every day | ORAL | 3 refills | Status: DC

## 2019-09-15 MED ORDER — APIXABAN 5 MG PO TABS: 5.0000 mg | ORAL_TABLET | Freq: Two times a day (BID) | ORAL | 5 refills | Status: DC

## 2019-09-15 NOTE — Progress Notes (Signed)
Eliquis and med refill for sleep med.   Depression f/u

## 2019-09-15 NOTE — Progress Notes (Signed)
Virtual Visit Note  I connected with patient on 09/15/19 at 605pm by phone and verified that I am speaking with the correct person using two identifiers. Connie Meyer is currently located at home and patient is currently with them during visit. The provider, [name], MD is located in their office at time of visit.  I discussed the limitations, risks, security and privacy concerns of performing an evaluation and management service by telephone and the availability of in person appointments. I also discussed with the patient that there may be a patient responsible charge related to this service. The patient expressed understanding and agreed to proceed.   CC: followup insomnia, depression, post CVA  HPI ? Last OV 2 weeks ago - started on [employer] really well, no side effects Mood however not improved, tearful, no enerygy Denies SI  No Known Allergies  Prior to Admission medications   Medication Sig Start Date End Date Taking? Authorizing Provider  amLODipine (NORVASC) 5 MG tablet Take 5 mg by mouth daily.   Yes [provider]  apixaban (ELIQUIS) 5 MG TABS tablet Take 1 tablet (5 mg total) by mouth 2 (two) times daily. 09/15/19 10/15/19 Yes McCue, Janett Billow, NP  atorvastatin (LIPITOR) 40 MG tablet Take 1 tablet (40 mg total) by mouth daily at 6 PM. 08/27/19  Yes Baldwin Jamaica, PA-C  bimatoprost (LATISSE) 0.03 % ophthalmic solution 1 application daily. 07/25/19  Yes [provider]  fluticasone (FLONASE) 50 MCG/ACT nasal spray Place 1 spray into both nostrils 2 (two) times daily. 07/07/19  Yes [name], MD  zolpidem (AMBIEN) 5 MG tablet Take 1 tablet (5 mg total) by mouth at bedtime as needed for sleep. 09/05/19  Yes [name], MD    Past Medical History:  Diagnosis Date  . Cancer ([place])    bilateral breast  . HTN (hypertension)   . Stroke [facility])     Past Surgical History:  Procedure Laterality Date  . BREAST SURGERY    . BUBBLE  STUDY  06/16/2019   Procedure: BUBBLE STUDY;  Surgeon: Fay Records, MD;  Location: [facility] ENDOSCOPY;  Service: Cardiovascular;;  . LOOP RECORDER INSERTION N/A 06/16/2019   Procedure: LOOP RECORDER INSERTION;  Surgeon: Evans Lance, MD;  Location: [place] CV LAB;  Service: Cardiovascular;  Laterality: N/A;  . LOOP RECORDER INSERTION N/A 08/12/2019   Procedure: LOOP RECORDER INSERTION;  Surgeon: Thompson Grayer, MD;  Location: [place] CV LAB;  Service: Cardiovascular;  Laterality: N/A;  . TEE WITHOUT CARDIOVERSION N/A 06/16/2019   Procedure: TRANSESOPHAGEAL ECHOCARDIOGRAM (TEE);  Surgeon: Fay Records, MD;  Location: [facility] ENDOSCOPY;  Service: Cardiovascular;  Laterality: N/A;    Social History   Tobacco Use  . Smoking status: Never Smoker  . Smokeless tobacco: Never Used  Substance Use Topics  . Alcohol use: Yes    Alcohol/week: 1.0 standard drinks    Types: 1 Glasses of wine per week    Family History  Problem Relation Age of Onset  . Hypertension Mother   . Hypertension Father     ROS Per hpi  Objective  Vitals as reported by the patient: none   ASSESSMENT and PLAN  1. Adjustment disorder with mixed anxiety and depressed mood Starting medication, reviewed r/se/b of sertraline. Referring for counseling. RTC precautions reviewed. - Ambulatory referral to Psychology  2. Insomnia, unspecified type Stable. Cont current regime - Ambulatory referral to Psychology  3. Cerebrovascular accident (CVA) due to thrombosis of precerebral  artery ([place])   Other orders - sertraline (ZOLOFT) 50 MG tablet; Take 1 tablet (50 mg total) by mouth daily. - apixaban (ELIQUIS) 5 MG TABS tablet; Take 1 tablet (5 mg total) by mouth 2 (two) times daily.  FOLLOW-UP: 4 weeks   The above assessment and management plan was discussed with the patient. The patient verbalized understanding of and has agreed to the management plan. Patient is aware to call the clinic if symptoms persist or worsen.  Patient is aware when to return to the clinic for a follow-up visit. Patient educated on when it is appropriate to go to the emergency department.    I provided 10 minutes of non-face-to-face time during this encounter.  [name], MD Primary Care at [facility] [place], [place] [address] Ph.  [phone] Fax [phone]

## 2019-09-15 NOTE — Telephone Encounter (Signed)
Please refill as Eliquis initiated by stroke team, Dr. Leonie Man, for secondary stroke prevention.

## 2019-09-15 NOTE — Patient Instructions (Signed)
° ° ° °  If you have lab work done today you will be contacted with your lab results within the next 2 weeks.  If you have not heard from us then please contact us. The fastest way to get your results is to register for My Chart. ° ° °IF you received an x-ray today, you will receive an invoice from [facility]. Please contact [facility] at [phone] with questions or concerns regarding your invoice.  ° °IF you received labwork today, you will receive an invoice from [facility]. Please contact [facility] at [phone] with questions or concerns regarding your invoice.  ° °Our billing staff will not be able to assist you with questions regarding bills from these companies. ° °You will be contacted with the lab results as soon as they are available. The fastest way to get your results is to activate your My Chart account. Instructions are located on the last page of this paperwork. If you have not heard from us regarding the results in 2 weeks, please contact this office. °  ° ° ° °

## 2019-09-15 NOTE — Telephone Encounter (Signed)
Pt called wanting to know when her apixaban (ELIQUIS) 5 MG TABS tablet(Expired) will be filled. Pt has completely run out as of today. Please advise.

## 2019-09-15 NOTE — Telephone Encounter (Signed)
Called pt and relayed that prescription filled for her for eliquis to her pharmacy.

## 2019-09-16 ENCOUNTER — Other Ambulatory Visit: Payer: Self-pay

## 2019-09-16 ENCOUNTER — Ambulatory Visit: Payer: Self-pay

## 2019-09-16 ENCOUNTER — Telehealth: Payer: Self-pay | Admitting: Family Medicine

## 2019-09-16 NOTE — Progress Notes (Signed)
Called pt and lvmtcb and schedule appt

## 2019-09-16 NOTE — Patient Outreach (Signed)
[facility]) Care Management  09/16/2019  Connie Meyer 1951-04-29 [id number]  EMMI:stroke red alert Referral date:08/25/19 Referral reason:sad/ hopeless/ anxious/ empty: yes Insurance: Medicare Day #9  Telephone call to patient regarding EMMI stroke follow up. HIPAA verified. Patient states she had a follow up visit with her primary MD on yesterday.  She reports her doctor prescribed her medication for sleep approximately 1 week ago. She states the medication is helping her to sleep better.   Patient states she discussed with her doctor she is still having some depression. She reports her doctor is referring her for counseling follow up and has called in a prescription for medication.  Patient states she has to pick the medication up from the pharmacy.   Patient verbalized appreciation to [facility] for the assistance and follow up.  Denies any further needs at this time.  RNCM advised patient to notify MD of any changes in condition prior to scheduled appointment. RNCM provided contact name and number:  24 hour nurse advise line [phone].  RNCM verified patient aware of 911 services for urgent/ emergent needs.  PLAN; RNCM will close case due to patient being assessed and having no further needs.  RNCM will resend patient [facility] care management brochure/ 24 hour nurse line magnet.  Quinn Plowman RN,BSN,CCM [facility] Telephonic  [phone]           a

## 2019-09-16 NOTE — Telephone Encounter (Signed)
lvmtcb and schedule 4 week office f/u

## 2019-09-17 ENCOUNTER — Other Ambulatory Visit: Payer: Self-pay | Admitting: Family Medicine

## 2019-09-17 NOTE — Telephone Encounter (Signed)
Controlled medication  Last seen:09/15/19- telemed Future appt: 10/16/19- in office   Last filled 09/05/19 with #15 w/ 0 refills

## 2019-09-17 NOTE — Telephone Encounter (Signed)
Requested medication (s) are due for refill today: yes  Requested medication (s) are on the active medication list: yes  Last refill:  09/05/2019  Future visit scheduled: yes  Notes to clinic:  Refill cannot be delegated   Requested Prescriptions  Pending Prescriptions Disp Refills   zolpidem (AMBIEN) 5 MG tablet [Pharmacy Med Name: ZOLPIDEM 5MG  TABLETS] 15 tablet     Sig: TAKE 1 TABLET(5 MG) BY MOUTH AT BEDTIME AS NEEDED FOR SLEEP     Not Delegated - Psychiatry:  Anxiolytics/Hypnotics Failed - 09/17/2019 11:49 AM      Failed - This refill cannot be delegated      Failed - Urine Drug Screen completed in last 360 days.      Passed - Valid encounter within last 6 months    Recent Outpatient Visits          2 days ago Adjustment disorder with mixed anxiety and depressed mood   [facility] at [facility], Lilia Argue, MD   1 week ago Insomnia, unspecified type   [facility] at [facility], Lilia Argue, MD   2 months ago Ischemic stroke [facility])   Primary [facility] at [facility], Lilia Argue, MD   2 months ago Ischemic stroke [facility])   Primary Care at [facility] A, MD   3 months ago Chronic right shoulder pain   [facility] at [facility], Lilia Argue, MD      Future Appointments            In 4 weeks [name], MD Primary Care at [place], [facility]

## 2019-09-22 ENCOUNTER — Ambulatory Visit (INDEPENDENT_AMBULATORY_CARE_PROVIDER_SITE_OTHER): Payer: Medicare Other | Admitting: *Deleted

## 2019-09-22 DIAGNOSIS — I6300 Cerebral infarction due to thrombosis of unspecified precerebral artery: Secondary | ICD-10-CM

## 2019-09-22 LAB — CUP PACEART REMOTE DEVICE CHECK
Date Time Interrogation Session: 20200928084134
Implantable Pulse Generator Implant Date: 20200818

## 2019-10-01 NOTE — Progress Notes (Signed)
Carelink Summary Report / Loop Recorder 

## 2019-10-10 ENCOUNTER — Telehealth: Payer: Self-pay | Admitting: Emergency Medicine

## 2019-10-10 NOTE — Telephone Encounter (Addendum)
LMOM to call DC.  Linq II transmission showed what appears to be possible  AF on presenting ECG on 10/10/19. Episode of  what appears to be  AF recorded 10/09/19 @ 1844 that lasted 4 minutes, the max v-rate was 122 bpm. + Eliquis. Implanted for cryptogenic stroke. Will refer to AF clinic.

## 2019-10-14 NOTE — Telephone Encounter (Signed)
EGMs reviewed with Dr Lovena Le. Appears that patient is in SR with PVCs. Continue to monitor and have patient to continue taking her Eliquis.

## 2019-10-15 NOTE — Telephone Encounter (Signed)
Lets review in the office. GT

## 2019-10-16 ENCOUNTER — Ambulatory Visit (INDEPENDENT_AMBULATORY_CARE_PROVIDER_SITE_OTHER): Payer: Medicare Other | Admitting: Family Medicine

## 2019-10-16 ENCOUNTER — Other Ambulatory Visit: Payer: Self-pay

## 2019-10-16 ENCOUNTER — Encounter: Payer: Self-pay | Admitting: Family Medicine

## 2019-10-16 VITALS — BP 140/85 | HR 75 | Temp 98.6°F | Ht 66.0 in | Wt 133.8 lb

## 2019-10-16 DIAGNOSIS — Z23 Encounter for immunization: Secondary | ICD-10-CM | POA: Diagnosis not present

## 2019-10-16 DIAGNOSIS — F5104 Psychophysiologic insomnia: Secondary | ICD-10-CM

## 2019-10-16 DIAGNOSIS — F4323 Adjustment disorder with mixed anxiety and depressed mood: Secondary | ICD-10-CM | POA: Diagnosis not present

## 2019-10-16 MED ORDER — APIXABAN 5 MG PO TABS: 5.0000 mg | ORAL_TABLET | Freq: Two times a day (BID) | ORAL | 5 refills | Status: DC

## 2019-10-16 NOTE — Progress Notes (Signed)
10/22/202011:56 AM  Connie Meyer [birth year], 68 y.o., female [id number]  Chief Complaint  Patient presents with  . Follow-up    cva, insomnia, taking the ambien nightly still wakes in the middle of the night  . Depression    medication is helping  . Medication Refill    eloquis    HPI:   Patient is a 68 y.o. female with past medical history significant for HTN, CVA, depression who presents today for routine followup  Last OV Sept 2020 Started sertraline, referred to counseling ambien for insomnia Had negative loop recorder - plan per telephone note was to cont on eliquis, discuss further in clinic  Patient feels that zoloft is working Agricultural consultant counseling as she has been feeling much better Taking ambien for sleep but wakes up 4-5 hours later, not able to fall back asleep, "not comfortable with my own thoughts", turns on her TV   Depression screen [facility] 2/9 10/16/2019 09/15/2019 09/05/2019  Decreased Interest 1 1 1   Down, Depressed, Hopeless 1 1 1   PHQ - 2 Score 2 2 2   Altered sleeping 3 0 3  Tired, decreased energy 2 3 3   Change in appetite 0 0 2  Feeling bad or failure about yourself  0 0 0  Trouble concentrating 0 1 2  Moving slowly or fidgety/restless 0 0 1  Suicidal thoughts 0 0 1  PHQ-9 Score 7 6 14   Difficult doing work/chores Not difficult at all Somewhat difficult Not difficult at all   GAD 7 : Generalized Anxiety Score 10/16/2019  Nervous, Anxious, on Edge 1  Control/stop worrying 0  Worry too much - different things 0  Trouble relaxing 0  Restless 0  Easily annoyed or irritable 1  Afraid - awful might happen 3  Total GAD 7 Score 5  Anxiety Difficulty Not difficult at all     Fall Risk  10/16/2019 09/15/2019 09/05/2019 07/07/2019 06/06/2019  Falls in the past year? 0 0 0 0 1  Number falls in past yr: 0 0 0 0 0  Injury with Fall? 0 0 0 0 1  Risk Factor Category  - - - - -  Risk for fall due to : - History of fall(s) - - -  Follow up - -  - - -     No Known Allergies  Prior to Admission medications   Medication Sig Start Date End Date Taking? Authorizing Provider  amLODipine (NORVASC) 5 MG tablet Take 5 mg by mouth daily.   Yes [provider]  apixaban (ELIQUIS) 5 MG TABS tablet Take 1 tablet (5 mg total) by mouth 2 (two) times daily. 09/15/19 10/16/19 Yes [name], MD  atorvastatin (LIPITOR) 40 MG tablet Take 1 tablet (40 mg total) by mouth daily at 6 PM. 08/27/19  Yes Baldwin [place], PA-C  bimatoprost (LATISSE) 0.03 % ophthalmic solution 1 application daily. 07/25/19  Yes [provider]  fluticasone (FLONASE) 50 MCG/ACT nasal spray Place 1 spray into both nostrils 2 (two) times daily. 07/07/19  Yes [name], MD  sertraline (ZOLOFT) 50 MG tablet Take 1 tablet (50 mg total) by mouth daily. 09/15/19  Yes [name], MD  zolpidem (AMBIEN) 5 MG tablet TAKE 1 TABLET(5 MG) BY MOUTH AT BEDTIME AS NEEDED FOR SLEEP 09/17/19  Yes [name], MD    Past Medical History:  Diagnosis Date  . Cancer ([place])    bilateral breast  . HTN (hypertension)   . Stroke [facility])  Past Surgical History:  Procedure Laterality Date  . BREAST SURGERY    . BUBBLE STUDY  06/16/2019   Procedure: BUBBLE STUDY;  Surgeon: Fay Records, MD;  Location: [facility] ENDOSCOPY;  Service: Cardiovascular;;  . LOOP RECORDER INSERTION N/A 06/16/2019   Procedure: LOOP RECORDER INSERTION;  Surgeon: Evans Lance, MD;  Location: [place] CV LAB;  Service: Cardiovascular;  Laterality: N/A;  . LOOP RECORDER INSERTION N/A 08/12/2019   Procedure: LOOP RECORDER INSERTION;  Surgeon: Thompson Grayer, MD;  Location: [place] CV LAB;  Service: Cardiovascular;  Laterality: N/A;  . TEE WITHOUT CARDIOVERSION N/A 06/16/2019   Procedure: TRANSESOPHAGEAL ECHOCARDIOGRAM (TEE);  Surgeon: Fay Records, MD;  Location: [facility] ENDOSCOPY;  Service: Cardiovascular;  Laterality: N/A;    Social History   Tobacco Use  . Smoking status: Never Smoker   . Smokeless tobacco: Never Used  Substance Use Topics  . Alcohol use: Yes    Alcohol/week: 1.0 standard drinks    Types: 1 Glasses of wine per week    Family History  Problem Relation Age of Onset  . Hypertension Mother   . Hypertension Father     ROS Per hpi  OBJECTIVE:  Today's Vitals   10/16/19 1150  BP: 140/85  Pulse: 75  Temp: 98.6 F (37 C)  SpO2: 98%  Weight: 133 lb 12.8 oz (60.7 kg)  Height: 5\' 6"  (1.676 m)   Body mass index is 21.6 kg/m.   Physical Exam Vitals signs and nursing note reviewed.  Constitutional:      Appearance: She is well-developed.  HENT:     Head: Normocephalic and atraumatic.  Eyes:     General: No scleral icterus.    Conjunctiva/sclera: Conjunctivae normal.     Pupils: Pupils are equal, round, and reactive to light.  Neck:     Musculoskeletal: Neck supple.  Pulmonary:     Effort: Pulmonary effort is normal.  Skin:    General: Skin is warm and dry.  Neurological:     Mental Status: She is alert and oriented to person, place, and time.     No results found for this or any previous visit (from the past 24 hour(s)).  No results found.   ASSESSMENT and PLAN  1. Adjustment disorder with mixed anxiety and depressed mood Improved. Cont with current dose of zoloft. Discussed importance of counseling. Strongly recommended she reschedule.  2. Psychophysiological insomnia More than 50% of this 25 minute visit was spent on counseling and education. Discussed importance of sleep hygiene.  Discussed mindfulness, provided patient with handouts.   3. Need for prophylactic vaccination and inoculation against influenza - Flu Vaccine QUAD High Dose(Fluad)  Other orders - apixaban (ELIQUIS) 5 MG TABS tablet; Take 1 tablet (5 mg total) by mouth 2 (two) times daily.  Return in about 2 months (around 12/16/2019).    [name], MD Primary Care at [place], [place] [address] Ph.  [phone] Fax [phone]

## 2019-10-16 NOTE — Patient Instructions (Signed)
° ° ° °  If you have lab work done today you will be contacted with your lab results within the next 2 weeks.  If you have not heard from us then please contact us. The fastest way to get your results is to register for My Chart. ° ° °IF you received an x-ray today, you will receive an invoice from [facility]. Please contact [facility] at [phone] with questions or concerns regarding your invoice.  ° °IF you received labwork today, you will receive an invoice from [facility]. Please contact [facility] at [phone] with questions or concerns regarding your invoice.  ° °Our billing staff will not be able to assist you with questions regarding bills from these companies. ° °You will be contacted with the lab results as soon as they are available. The fastest way to get your results is to activate your My Chart account. Instructions are located on the last page of this paperwork. If you have not heard from us regarding the results in 2 weeks, please contact this office. °  ° ° ° °

## 2019-10-17 ENCOUNTER — Ambulatory Visit: Payer: Medicare Other | Admitting: Psychology

## 2019-10-23 ENCOUNTER — Ambulatory Visit (INDEPENDENT_AMBULATORY_CARE_PROVIDER_SITE_OTHER): Payer: Medicare Other | Admitting: *Deleted

## 2019-10-23 DIAGNOSIS — I6300 Cerebral infarction due to thrombosis of unspecified precerebral artery: Secondary | ICD-10-CM

## 2019-10-23 LAB — CUP PACEART REMOTE DEVICE CHECK
Date Time Interrogation Session: 20201029115130
Implantable Pulse Generator Implant Date: 20200818

## 2019-10-28 ENCOUNTER — Ambulatory Visit: Payer: Medicare Other | Admitting: Adult Health

## 2019-11-15 NOTE — Progress Notes (Signed)
Carelink Summary Report / Loop Recorder 

## 2019-11-24 ENCOUNTER — Encounter: Payer: Self-pay | Admitting: Adult Health

## 2019-11-24 ENCOUNTER — Ambulatory Visit (INDEPENDENT_AMBULATORY_CARE_PROVIDER_SITE_OTHER): Payer: Medicare Other | Admitting: Adult Health

## 2019-11-24 ENCOUNTER — Other Ambulatory Visit: Payer: Self-pay

## 2019-11-24 ENCOUNTER — Ambulatory Visit (INDEPENDENT_AMBULATORY_CARE_PROVIDER_SITE_OTHER): Payer: Medicare Other | Admitting: *Deleted

## 2019-11-24 VITALS — BP 140/84 | HR 84 | Temp 97.9°F | Ht 66.0 in | Wt 139.0 lb

## 2019-11-24 DIAGNOSIS — I10 Essential (primary) hypertension: Secondary | ICD-10-CM | POA: Diagnosis not present

## 2019-11-24 DIAGNOSIS — I639 Cerebral infarction, unspecified: Secondary | ICD-10-CM

## 2019-11-24 DIAGNOSIS — F411 Generalized anxiety disorder: Secondary | ICD-10-CM

## 2019-11-24 DIAGNOSIS — F409 Phobic anxiety disorder, unspecified: Secondary | ICD-10-CM | POA: Diagnosis not present

## 2019-11-24 DIAGNOSIS — I6300 Cerebral infarction due to thrombosis of unspecified precerebral artery: Secondary | ICD-10-CM | POA: Diagnosis not present

## 2019-11-24 DIAGNOSIS — F5105 Insomnia due to other mental disorder: Secondary | ICD-10-CM

## 2019-11-24 DIAGNOSIS — E785 Hyperlipidemia, unspecified: Secondary | ICD-10-CM

## 2019-11-24 DIAGNOSIS — F329 Major depressive disorder, single episode, unspecified: Secondary | ICD-10-CM | POA: Diagnosis not present

## 2019-11-24 NOTE — Progress Notes (Signed)
[facility] [address] [place]. [place] [address] [phone]       OFFICE FOLLOW UP NOTE  [name] Date of Birth:  1951/01/15 Medical Record Number:  [id number]   Reason for Referral: Cryptogenic stroke follow up    CHIEF COMPLAINT:  Chief Complaint  Patient presents with  . Follow-up    3 mon f/u. Alone. Rm 9. No new concerns at this time.     HPI:  Hospital admission 06/13/2019: [name] is a 68 y.o. female with no significant past medical hx other than previous breast cancer presenting with transient visual changes in the setting of recently diagnosed hypertension. She did not receive IV t-PA due to late presentation (>4.5 hours from time of onset).  Stroke work-up revealed punctate acute/early subacute infarctions within the left occipital lobe embolic pattern secondary to unknown source with residual superior homonymous quadrantanopia.  MRI showed punctate acute/early subacute infarctions within the left occipital lobe.  TCD and carotid Dopplers unremarkable.  2D echo unremarkable without cardiac source is identified.  TEE no cardiac source of embolus or PFO therefore loop recorder placed to assess for atrial fibrillation.  LDL 107 A1c 5.4.  Recommended DAPT for 3 weeks and aspirin alone.  Initiated atorvastatin 80 mg daily for HTN management.  HTN stable.  Other stroke risk factors include advanced age and EtOH use but no prior history of stroke.  Discharged home in stable condition without residual deficits.  Initial visit 07/23/2019: No residual deficits - she does question if vision slightly worse such as film over eyes -she plans on scheduling visit with ophthalmology Completed 3 weeks DAPT with recommendation of continuing aspirin alone but discontinued both aspirin and Plavix as she did not have additional refills Continues on atorvastatin without myalgias Blood pressure 150/90 - at home typically 130s Loop recorder  apparently fell out shortly after implanted.  Cardiology aware and plans on replacing soon. No further concerns at this time.  Update 11/24/2019: Connie Meyer is a 68 year old female who is being seen today for stroke follow-up.  Since prior visit, on 08/11/2019, she presented to ED with 2 transient episodes of confusion along with apraxia.  MRI showed small cortical and subcortical right parietal lobe infarcts new from prior imaging with cryptogenic etiology.  Loop recorder planned on being reimplanted as it was inadvertently expelled but had not done so prior to new strokes.  Loop recorder replaced during admission.  She was recently enrolled in the [place] trial but unfortunately met study endpoint due to new strokes.  Due to recent qualification with Remi Haggard trial and 2 embolic strokes, recommended initiating Eliquis.  HTN stable.  LDL 67 and continued on atorvastatin 40 mg daily.  Prior history of breast cancer in remission for 20 years and recommended outpatient follow-up with Dr. Pamella Pert outpatient for cancer recurrence screening with questionable hypercoagulable state with recurrent strokes.  Discharged home in stable condition. Since discharge, she has been doing well from a stroke standpoint without residual deficits or reoccurring of symptoms.  She was started on sertraline by PCP with improvement of depression but reports today that she self discontinued sertraline approximately 2 weeks ago due to feeling of flat affect and lack of emotion.  Since discontinuing, she has experienced slight worsening of depression/anxiety.  She was referred to therapy by PCP with evaluation scheduled but canceled visit as she felt depression stable.  She is interested at this time of pursuing therapy and plans on calling to schedule evaluation.  She also reports greater difficulty with short-term memory and delayed processing/recall.  She is also been having difficulty with insomnia currently on Ambien 5 mg nightly  with mild benefit.  She also feels increased depression/anxiety related to current COVID-19 concerns and restrictions.  She also previously exercise frequently but has been fearful of exercising as she had reoccurring stroke after going for a run.  She has continued on apixaban 5 mg twice daily without bleeding or bruising.  Continues on atorvastatin without myalgias.  Blood pressure today 140/84.  Loop recorder showed 10 new false AF episodes without true AF reported.  No further concerns at this time.        ROS:   14 system review of systems performed and negative with exception of depression/anxiety  PMH:  Past Medical History:  Diagnosis Date  . Cancer ([place])    bilateral breast  . HTN (hypertension)   . Stroke [facility])     PSH:  Past Surgical History:  Procedure Laterality Date  . BREAST SURGERY    . BUBBLE STUDY  06/16/2019   Procedure: BUBBLE STUDY;  Surgeon: Fay Records, MD;  Location: [facility] ENDOSCOPY;  Service: Cardiovascular;;  . LOOP RECORDER INSERTION N/A 06/16/2019   Procedure: LOOP RECORDER INSERTION;  Surgeon: Evans Lance, MD;  Location: [place] CV LAB;  Service: Cardiovascular;  Laterality: N/A;  . LOOP RECORDER INSERTION N/A 08/12/2019   Procedure: LOOP RECORDER INSERTION;  Surgeon: Thompson Grayer, MD;  Location: [place] CV LAB;  Service: Cardiovascular;  Laterality: N/A;  . TEE WITHOUT CARDIOVERSION N/A 06/16/2019   Procedure: TRANSESOPHAGEAL ECHOCARDIOGRAM (TEE);  Surgeon: Fay Records, MD;  Location: [facility] ENDOSCOPY;  Service: Cardiovascular;  Laterality: N/A;    Social History:  Social History   Socioeconomic History  . Marital status: Divorced    Spouse name: Not on file  . Number of children: 4  . Years of education: Not on file  . Highest education level: Not on file  Occupational History  . Not on file  Social Needs  . Financial resource strain: Not on file  . Food insecurity    Worry: Not on file    Inability: Not on file  . Transportation  needs    Medical: Not on file    Non-medical: Not on file  Tobacco Use  . Smoking status: Never Smoker  . Smokeless tobacco: Never Used  Substance and Sexual Activity  . Alcohol use: Yes    Alcohol/week: 1.0 standard drinks    Types: 1 Glasses of wine per week  . Drug use: No  . Sexual activity: Never  Lifestyle  . Physical activity    Days per week: Not on file    Minutes per session: Not on file  . Stress: Not on file  Relationships  . Social Herbalist on phone: Not on file    Gets together: Not on file    Attends religious service: Not on file    Active member of club or organization: Not on file    Attends meetings of clubs or organizations: Not on file    Relationship status: Not on file  . Intimate partner violence    Fear of current or ex partner: Not on file    Emotionally abused: Not on file    Physically abused: Not on file    Forced sexual activity: Not on file  Other Topics Concern  . Not on file  Social History Narrative  . Not on  file    Family History:  Family History  Problem Relation Age of Onset  . Hypertension Mother   . Hypertension Father     Medications:   Current Outpatient Medications on File Prior to Visit  Medication Sig Dispense Refill  . amLODipine (NORVASC) 5 MG tablet Take 5 mg by mouth daily.    [name] apixaban (ELIQUIS) 5 MG TABS tablet Take 5 mg by mouth 2 (two) times daily.    [name] atorvastatin (LIPITOR) 40 MG tablet Take 1 tablet (40 mg total) by mouth daily at 6 PM. 90 tablet 3  . bimatoprost (LATISSE) 0.03 % ophthalmic solution 1 application daily.    . fluticasone (FLONASE) 50 MCG/ACT nasal spray Place 1 spray into both nostrils 2 (two) times daily. 16 g 6  . zolpidem (AMBIEN) 5 MG tablet TAKE 1 TABLET(5 MG) BY MOUTH AT BEDTIME AS NEEDED FOR SLEEP 30 tablet 1   No current facility-administered medications on file prior to visit.     Allergies:  No Known Allergies   Physical Exam  Vitals:   11/24/19 0727  BP:  140/84  Pulse: 84  Temp: 97.9 F (36.6 C)  TempSrc: Oral  Weight: 139 lb (63 kg)  Height: 5' 6"  (1.676 m)   Body mass index is 22.44 kg/m. No exam data present  Depression screen [facility] 2/9 11/24/2019  Decreased Interest 1  Down, Depressed, Hopeless 1  PHQ - 2 Score 2  Altered sleeping 3  Tired, decreased energy 1  Change in appetite 1  Feeling bad or failure about yourself  1  Trouble concentrating 1  Moving slowly or fidgety/restless 0  Suicidal thoughts 0  PHQ-9 Score 9  Difficult doing work/chores -    GAD 7 : Generalized Anxiety Score 11/24/2019 10/16/2019  Nervous, Anxious, on Edge 1 1  Control/stop worrying 1 0  Worry too much - different things 1 0  Trouble relaxing 1 0  Restless 1 0  Easily annoyed or irritable 1 1  Afraid - awful might happen 1 3  Total GAD 7 Score 7 5  Anxiety Difficulty - Not difficult at all     General: well developed, well nourished,  pleasant middle-age Caucasian female, seated, tearful during visit Head: head normocephalic and atraumatic.   Neck: supple with no carotid or supraclavicular bruits Cardiovascular: regular rate and rhythm, no murmurs Musculoskeletal: no deformity Skin:  no rash/petichiae Vascular:  Normal pulses all extremities   Neurologic Exam Mental Status: Awake and fully alert. Oriented to place and time. Recent and remote memory slightly diminished. Attention span, concentration and fund of knowledge appropriate during visit.  Tearful with increased anxiety during visit Cranial Nerves: Fundoscopic exam reveals sharp disc margins. Pupils equal, briskly reactive to light. Extraocular movements full without nystagmus. Visual fields full to confrontation. Hearing intact. Facial sensation intact. Face, tongue, palate moves normally and symmetrically.  Motor: Normal bulk and tone. Normal strength in all tested extremity muscles. Sensory.: intact to touch , pinprick , position and vibratory sensation.  Coordination: Rapid  alternating movements normal in all extremities. Finger-to-nose and heel-to-shin performed accurately bilaterally. Gait and Station: Arises from chair without difficulty. Stance is normal. Gait demonstrates normal stride length and balance Reflexes: 1+ and symmetric. Toes downgoing.     NIHSS  0 Modified Rankin  0   Diagnostic Data (Labs, Imaging, Testing)  MR BRAIN WO CONTRAST 08/11/2019 IMPRESSION: Scattered small cortical and subcortical infarcts in the right parietal lobe. Negative for hemorrhage Mild chronic microvascular ischemic change in  the white matter stable since the prior MRI. Prior MRI demonstrated a small acute infarct left occipital lobe which has resolved. Consider cerebral emboli.    Mr Brain Wo Contrast 06/14/2019 IMPRESSION:  1. Small group of punctate acute/early subacute infarctions within the left occipital lobe. No hemorrhage or mass effect.  2. Mild-to-moderate chronic microvascular ischemic changes and volume loss of the brain.  3. Opacification of the left mastoid air cells and partial opacification of the right mastoid air cells.   Transthoracic Echocardiogram  06/14/2019 IMPRESSIONS 1. The left ventricle has normal systolic function, with an ejection fraction of 55-60%. The cavity size was normal. Left ventricular diastolic Doppler parameters are consistent with impaired relaxation. Indeterminate filling pressures The E/e' is 8-15. 2. The right ventricle has normal systolic function. The cavity was normal. There is no increase in right ventricular wall thickness. 3. The mitral valve is grossly normal. 4. The tricuspid valve is grossly normal. 5. The aortic valve is grossly normal. No stenosis of the aortic valve.   Bilateral Carotid Dopplers  No significant bilateral extracranial carotid stenosis.  Transcranial Dopplers - Technically limited due to possible poor windows.Low mean flow velocities in posterior circulation vessels of unclear  significance   EKG - SR rate 71 BPM. (See cardiology reading for complete details)   ASSESSMENT: Connie Meyer is a 68 y.o. year old female here with reoccurring cryptogenic strokes recently 08/11/2019 small cortical and subcortical infarcts in the right parietal lobe and punctate acute/early subacute infarctions within the left occipital lobe embolic pattern on 0/27/2536.  Loop recorder inadvertently expelled and had plans on following up with cardiology for replacement but not completed prior to August admission.  She was enrolled in [place] trial but due to reoccurring stroke, but study endpoint met due to reoccurring stroke.  Initiated Eliquis due to reoccurring stroke.  Loop recorder replaced during admission.  Vascular risk factors include HTN, HLD and prior EtOH use.  She has been having difficulty with post stroke/reactive depression and anxiety as well as subjective short-term memory loss.  Experienced improvement on depression on sertraline but self discontinued due to reports of lack of emotion and flat affect.  No residual stroke deficits.    PLAN:  1. Reoccurring cryptogenic stroke: Continue Eliquis (apixaban) daily  and continue atorvastatin 80 mg daily for secondary stroke prevention.  Qualified for Eliquis due to meeting criteria for Remi Haggard trial and reoccurring cortical strokes with undetermined etiology.  Maintain strict control of hypertension with blood pressure goal below 130/90, diabetes with hemoglobin A1c goal below 6.5% and cholesterol with LDL cholesterol (bad cholesterol) goal below 70 mg/dL.  I also advised the patient to eat a healthy diet with plenty of whole grains, cereals, fruits and vegetables, exercise regularly with at least 30 minutes of continuous activity daily and maintain ideal body weight.  Ongoing monitoring of loop recorder for potential atrial fibrillation. 2. HTN: Advised to continue current treatment regimen.  Today's BP stable.  Advised to continue  to monitor at home along with continued follow-up with PCP for management 3. HLD: Advised to continue current treatment regimen along with continued follow-up with PCP for future prescribing and monitoring of lipid panel 4. History of breast cancer: Remission for 20 years.  Recommend following with PCP for possible repeat imaging to assess for recurrence with questionable hypercoagulable state during recent admission.  She declines interest in follow-up imaging at this time as she was told she would experience symptoms and is asymptomatic at this time.  She  is aware to follow with her PCP/oncology with any symptoms or she wishes to pursue screening imaging 5. Depression/anxiety: Likely combination of post stroke and reactive.  PHQ 9 score 9.  GAD-7 score 7.  Discussion regarding use of antidepressants but declines interest at this time.  Highly encouraged use of therapy/counseling which she plans on pursuing.  She is aware to follow with her PCP in the future if interested in restarting antidepressant.  Also discussed slowly restarting yoga and daily exercise as this will greatly improve overall mood and decrease risk of reoccurring stroke.  Also advise short-term memory concerns, delayed processing/recall and insomnia likely secondary to untreated depression/anxiety.  Recommended memory exercises which were discussed during visit    Follow up in 4 months or call earlier if needed   Greater than 50% of time during this 25 minute visit was spent on counseling, explanation of diagnosis of cryptogenic stroke, reviewing risk factor management of HTN, HLD, discussion regarding depression/anxiety high risk post stroke and discussion regarding further treatment options, planning of further management along with potential future management, and answered all questions to patient satisfaction    Frann Rider, AGNP-BC  [facility] [address] [place], [place] [address]   Phone [phone] Fax [phone] Note: This document was prepared with digital dictation and possible smart phrase technology. Any transcriptional errors that result from this process are unintentional.

## 2019-11-24 NOTE — Patient Instructions (Addendum)
Continue Eliquis (apixaban) daily  and Lipitor for secondary stroke prevention  Continue to follow up with PCP regarding blood pressure, depression and cholesterol management   Continue to monitor blood pressure at home  Loop recorder will continue to be monitored for potential atrial fibrillation  Maintain strict control of hypertension with blood pressure goal below 130/90, diabetes with hemoglobin A1c goal below 6.5% and cholesterol with LDL cholesterol (bad cholesterol) goal below 70 mg/dL. I also advised the patient to eat a healthy diet with plenty of whole grains, cereals, fruits and vegetables, exercise regularly and maintain ideal body weight.  Followup in the future with me in 4 months or call earlier if needed       Thank you for coming to see [place] at [facility]. I hope we have been able to provide you high quality care today.  You may receive a patient satisfaction survey over the next few weeks. We would appreciate your feedback and comments so that we may continue to improve ourselves and the health of our patients.

## 2019-11-25 LAB — CUP PACEART REMOTE DEVICE CHECK
Date Time Interrogation Session: 20201201074708
Implantable Pulse Generator Implant Date: 20200818

## 2019-12-01 ENCOUNTER — Telehealth: Payer: Self-pay

## 2019-12-01 ENCOUNTER — Other Ambulatory Visit: Payer: Self-pay

## 2019-12-01 MED ORDER — ZOLPIDEM TARTRATE 5 MG PO TABS: ORAL_TABLET | ORAL | 1 refills | Status: DC

## 2019-12-01 NOTE — Telephone Encounter (Signed)
Pmp reviwed Med refilled

## 2019-12-01 NOTE — Telephone Encounter (Signed)
Pt requesting medication 

## 2019-12-29 ENCOUNTER — Ambulatory Visit (INDEPENDENT_AMBULATORY_CARE_PROVIDER_SITE_OTHER): Payer: Medicare Other | Admitting: *Deleted

## 2019-12-29 DIAGNOSIS — I639 Cerebral infarction, unspecified: Secondary | ICD-10-CM | POA: Diagnosis not present

## 2019-12-29 LAB — CUP PACEART REMOTE DEVICE CHECK
Date Time Interrogation Session: 20210103074710
Implantable Pulse Generator Implant Date: 20200818

## 2019-12-31 NOTE — Progress Notes (Signed)
ILR remote 

## 2020-01-26 ENCOUNTER — Other Ambulatory Visit: Payer: Self-pay | Admitting: Family Medicine

## 2020-01-27 ENCOUNTER — Ambulatory Visit (INDEPENDENT_AMBULATORY_CARE_PROVIDER_SITE_OTHER): Payer: Medicare Other | Admitting: Orthopaedic Surgery

## 2020-01-27 ENCOUNTER — Ambulatory Visit: Payer: Medicare Other | Admitting: Orthopaedic Surgery

## 2020-01-27 ENCOUNTER — Other Ambulatory Visit: Payer: Self-pay

## 2020-01-27 ENCOUNTER — Encounter: Payer: Self-pay | Admitting: Orthopaedic Surgery

## 2020-01-27 DIAGNOSIS — M7541 Impingement syndrome of right shoulder: Secondary | ICD-10-CM | POA: Diagnosis not present

## 2020-01-27 DIAGNOSIS — I639 Cerebral infarction, unspecified: Secondary | ICD-10-CM

## 2020-01-27 HISTORY — DX: Impingement syndrome of right shoulder: M75.41

## 2020-01-27 MED ORDER — BUPIVACAINE HCL 0.5 % IJ SOLN
2.0000 mL | INTRAMUSCULAR | Status: AC | PRN
  Administered 2020-01-27: 2 mL via INTRA_ARTICULAR

## 2020-01-27 MED ORDER — METHYLPREDNISOLONE ACETATE 40 MG/ML IJ SUSP
80.0000 mg | INTRAMUSCULAR | Status: AC | PRN
  Administered 2020-01-27: 80 mg via INTRA_ARTICULAR

## 2020-01-27 MED ORDER — LIDOCAINE HCL 2 % IJ SOLN
2.0000 mL | INTRAMUSCULAR | Status: AC | PRN
  Administered 2020-01-27: 2 mL

## 2020-01-27 NOTE — Progress Notes (Signed)
Office Visit Note   Patient: Connie Meyer           Date of Birth: [birth year]           MRN: [id number] Visit Date: 01/27/2020              Requested by: [name], MD [address] [place],  [place] [address] PCP: [name], MD   Assessment & Plan: Visit Diagnoses:  1. Impingement syndrome of right shoulder     Plan: Recurrent symptoms of impingement syndrome right shoulder.  Long discussion regarding diagnostic possibilities as an update from prior office visits.  Certainly is a possibility of a small rotator cuff tear.  Connie Meyer would like to have another cortisone injection before proceeding with further diagnostic testing i.e. MRI scan  Follow-Up Instructions: Return if symptoms worsen or fail to improve.   Orders:  Orders Placed This Encounter  Procedures  . Large Joint Inj: R subacromial bursa   No orders of the defined types were placed in this encounter.     Procedures: Large Joint Inj: R subacromial bursa on 01/27/2020 11:42 AM Indications: pain and diagnostic evaluation Details: 25 G 1.5 in needle, anterolateral approach  Arthrogram: No  Medications: 2 mL lidocaine 2 %; 2 mL bupivacaine 0.5 %; 80 mg methylPREDNISolone acetate 40 MG/ML Outcome: tolerated well, no immediate complications Consent was given by the patient. Immediately prior to procedure a time out was called to verify the correct patient, procedure, equipment, support staff and site/side marked as required. Patient was prepped and draped in the usual sterile fashion.       Clinical Data: No additional findings.   Subjective: Chief Complaint  Patient presents with  . Right Shoulder - Pain  Patient presents today with recurrent right shoulder pain. She was last evaluated in July of 2020 and received a cortisone injection. Patient states that her shoulder just recently started hurting again, but hurting more than it did before. She does not take anything for pain.  No  numbness or tingling.  No recurrent injury or trauma.  Pain is localized to the right shoulder.  No neck pain.  Has chronic lymphedema of the left upper extremity from prior breast surgery.  Pain worse with motion and little if any discomfort at rest  HPI  Review of Systems   Objective: Vital Signs: Ht 5\' 6"  (1.676 m)   Wt 138 lb (62.6 kg)   BMI 22.27 kg/m   Physical Exam Constitutional:      Appearance: She is well-developed.  Eyes:     Pupils: Pupils are equal, round, and reactive to light.  Pulmonary:     Effort: Pulmonary effort is normal.  Skin:    General: Skin is warm and dry.  Neurological:     Mental Status: She is alert and oriented to person, place, and time.  Psychiatric:        Behavior: Behavior normal.     Ortho Exam awake alert and oriented x3.  Comfortable sitting.  Positive impingement and empty can testing right shoulder.  Good strength.  Biceps intact.  Lymphedema in the arm and forearm.  No grinding or grating.  Some discomfort along the anterior subacromial region.  No pain along the deltopectoral groove  Specialty Comments:  No specialty comments available.  Imaging: No results found.   PMFS History: Patient Active Problem List   Diagnosis Date Noted  . Impingement syndrome of right shoulder 01/27/2020  . Adjustment disorder with  mixed anxiety and depressed mood 09/15/2019  . Insomnia 09/15/2019  . Dyslipidemia 08/12/2019  . CVA (cerebral vascular accident) ([place]) 08/11/2019  . Acute CVA (cerebrovascular accident) ([place]) 06/14/2019  . HTN (hypertension) 06/14/2019  . Hypokalemia 06/14/2019  . H/O bilateral mastectomy 02/17/2019  . Rib pain on left side 09/18/2016  . Hiatal hernia 09/05/2016  . History of breast cancer in female-2000, 2003 03/17/2016   Past Medical History:  Diagnosis Date  . Cancer ([place])    bilateral breast  . HTN (hypertension)   . Stroke [facility])     Family History  Problem Relation Age of Onset  . Hypertension Mother    . Hypertension Father     Past Surgical History:  Procedure Laterality Date  . BREAST SURGERY    . BUBBLE STUDY  06/16/2019   Procedure: BUBBLE STUDY;  Surgeon: Fay Records, MD;  Location: [facility] ENDOSCOPY;  Service: Cardiovascular;;  . LOOP RECORDER INSERTION N/A 06/16/2019   Procedure: LOOP RECORDER INSERTION;  Surgeon: Evans Lance, MD;  Location: [place] CV LAB;  Service: Cardiovascular;  Laterality: N/A;  . LOOP RECORDER INSERTION N/A 08/12/2019   Procedure: LOOP RECORDER INSERTION;  Surgeon: Thompson Grayer, MD;  Location: [place] CV LAB;  Service: Cardiovascular;  Laterality: N/A;  . TEE WITHOUT CARDIOVERSION N/A 06/16/2019   Procedure: TRANSESOPHAGEAL ECHOCARDIOGRAM (TEE);  Surgeon: Fay Records, MD;  Location: [facility] ENDOSCOPY;  Service: Cardiovascular;  Laterality: N/A;   Social History   Occupational History  . Not on file  Tobacco Use  . Smoking status: Never Smoker  . Smokeless tobacco: Never Used  Substance and Sexual Activity  . Alcohol use: Yes    Alcohol/week: 1.0 standard drinks    Types: 1 Glasses of wine per week  . Drug use: No  . Sexual activity: Never

## 2020-01-28 ENCOUNTER — Other Ambulatory Visit: Payer: Self-pay | Admitting: Family Medicine

## 2020-01-28 NOTE — Telephone Encounter (Signed)
Patient is requesting a refill of the following medications: Requested Prescriptions   Pending Prescriptions Disp Refills   zolpidem (AMBIEN) 5 MG tablet [Pharmacy Med Name: ZOLPIDEM 5MG  TABLETS] 30 tablet     Sig: TAKE 1 TABLET(5 MG) BY MOUTH AT BEDTIME AS NEEDED FOR SLEEP    Date of patient request: 01/28/2020 Last office visit: 10/16/19 Date of last refill: 12/01/19 Last refill amount: 30 1rf Follow up time period per chart:

## 2020-01-28 NOTE — Telephone Encounter (Signed)
Requested medication (s) are due for refill today - yes  Requested medication (s) are on the active medication list - yes  Future visit scheduled -no  Last refill: - 12/27/19  Notes to clinic: Patient is requesting non delegated Rx  Requested Prescriptions  Pending Prescriptions Disp Refills   zolpidem (AMBIEN) 5 MG tablet [Pharmacy Med Name: ZOLPIDEM 5MG  TABLETS] 30 tablet     Sig: TAKE 1 TABLET(5 MG) BY MOUTH AT BEDTIME AS NEEDED FOR SLEEP      Not Delegated - Psychiatry:  Anxiolytics/Hypnotics Failed - 01/28/2020  4:04 PM      Failed - This refill cannot be delegated      Failed - Urine Drug Screen completed in last 360 days.      Passed - Valid encounter within last 6 months    Recent Outpatient Visits           3 months ago Adjustment disorder with mixed anxiety and depressed mood   [facility], Lilia Argue, MD   4 months ago Adjustment disorder with mixed anxiety and depressed mood   [facility], Lilia Argue, MD   4 months ago Insomnia, unspecified type   [facility], Lilia Argue, MD   6 months ago Ischemic stroke [facility])   [facility], Lilia Argue, MD   7 months ago Ischemic stroke [facility])   [facility] at [place], MD                  Requested Prescriptions  Pending Prescriptions Disp Refills   zolpidem (AMBIEN) 5 MG tablet [Pharmacy Med Name: ZOLPIDEM 5MG  TABLETS] 30 tablet     Sig: TAKE 1 TABLET(5 MG) BY MOUTH AT BEDTIME AS NEEDED FOR SLEEP      Not Delegated - Psychiatry:  Anxiolytics/Hypnotics Failed - 01/28/2020  4:04 PM      Failed - This refill cannot be delegated      Failed - Urine Drug Screen completed in last 360 days.      Passed - Valid encounter within last 6 months    Recent Outpatient Visits           3 months ago Adjustment disorder with mixed anxiety and depressed mood   [facility] at [facility], Lilia Argue, MD   4 months ago Adjustment disorder with mixed  anxiety and depressed mood   [facility], Lilia Argue, MD   4 months ago Insomnia, unspecified type   [facility], Lilia Argue, MD   6 months ago Ischemic stroke [facility])   [facility], Lilia Argue, MD   7 months ago Ischemic stroke [facility])   Primary Care at [facility], Arlie Solomons, MD

## 2020-01-28 NOTE — Telephone Encounter (Signed)
Please advise on refill.

## 2020-01-29 ENCOUNTER — Ambulatory Visit (INDEPENDENT_AMBULATORY_CARE_PROVIDER_SITE_OTHER): Payer: Medicare Other | Admitting: *Deleted

## 2020-01-29 DIAGNOSIS — I639 Cerebral infarction, unspecified: Secondary | ICD-10-CM | POA: Diagnosis not present

## 2020-01-29 LAB — CUP PACEART REMOTE DEVICE CHECK
Date Time Interrogation Session: 20210203230140
Implantable Pulse Generator Implant Date: 20200817200000

## 2020-01-29 NOTE — Telephone Encounter (Signed)
pmp reviewed  Med refilled 

## 2020-01-30 NOTE — Progress Notes (Signed)
ILR Remote 

## 2020-02-01 ENCOUNTER — Ambulatory Visit: Payer: Medicare Other | Attending: Internal Medicine

## 2020-02-01 DIAGNOSIS — Z23 Encounter for immunization: Secondary | ICD-10-CM | POA: Insufficient documentation

## 2020-02-01 NOTE — Progress Notes (Signed)
   Covid-19 Vaccination Clinic  Name:  Connie Meyer    MRN: [id number] DOB: January 16, 1951  02/01/2020  [name] was observed post Covid-19 immunization for 15 minutes without incidence. She was provided with Vaccine Information Sheet and instruction to access the V-Safe system.   [name] was instructed to call 911 with any severe reactions post vaccine: [name] Difficulty breathing  . Swelling of your face and throat  . A fast heartbeat  . A bad rash all over your body  . Dizziness and weakness    Immunizations Administered    Name Date Dose VIS Date Route   Pfizer COVID-19 Vaccine 02/01/2020  3:47 PM 0.3 mL 12/05/2019 Intramuscular   Manufacturer: [place]   Lot: [id number]   [place]: [id number]

## 2020-02-20 ENCOUNTER — Ambulatory Visit: Payer: Medicare Other

## 2020-02-24 IMAGING — DX CHEST - 2 VIEW
2 series · 2 of 2 positions shown · non-contrast
Comparison: August 30, 2016

CLINICAL DATA: Assess loop recorder location.

EXAM:
CHEST - 2 VIEW

[dg chest 2 view (1 of 2)]
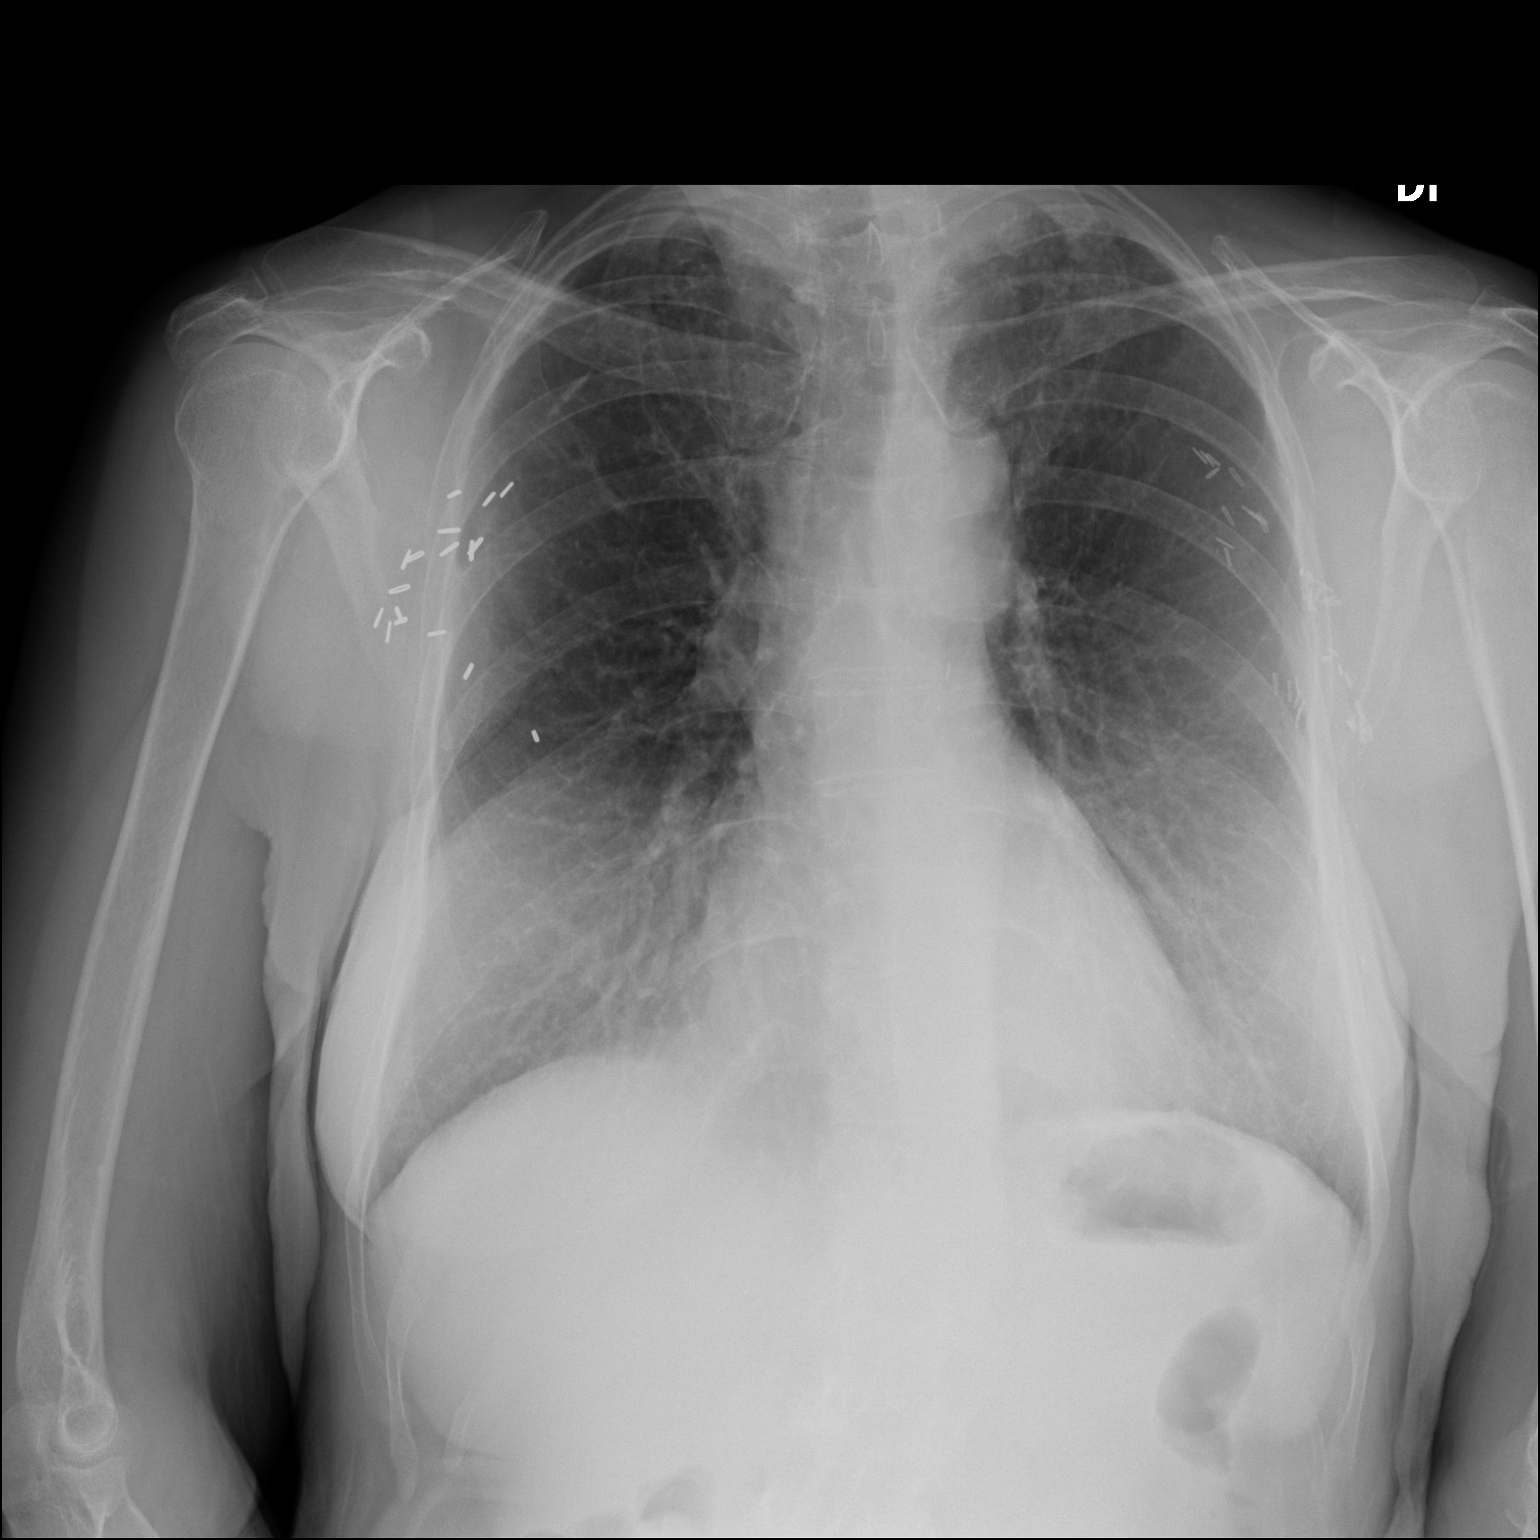

[dg chest 2 view (2 of 2)]
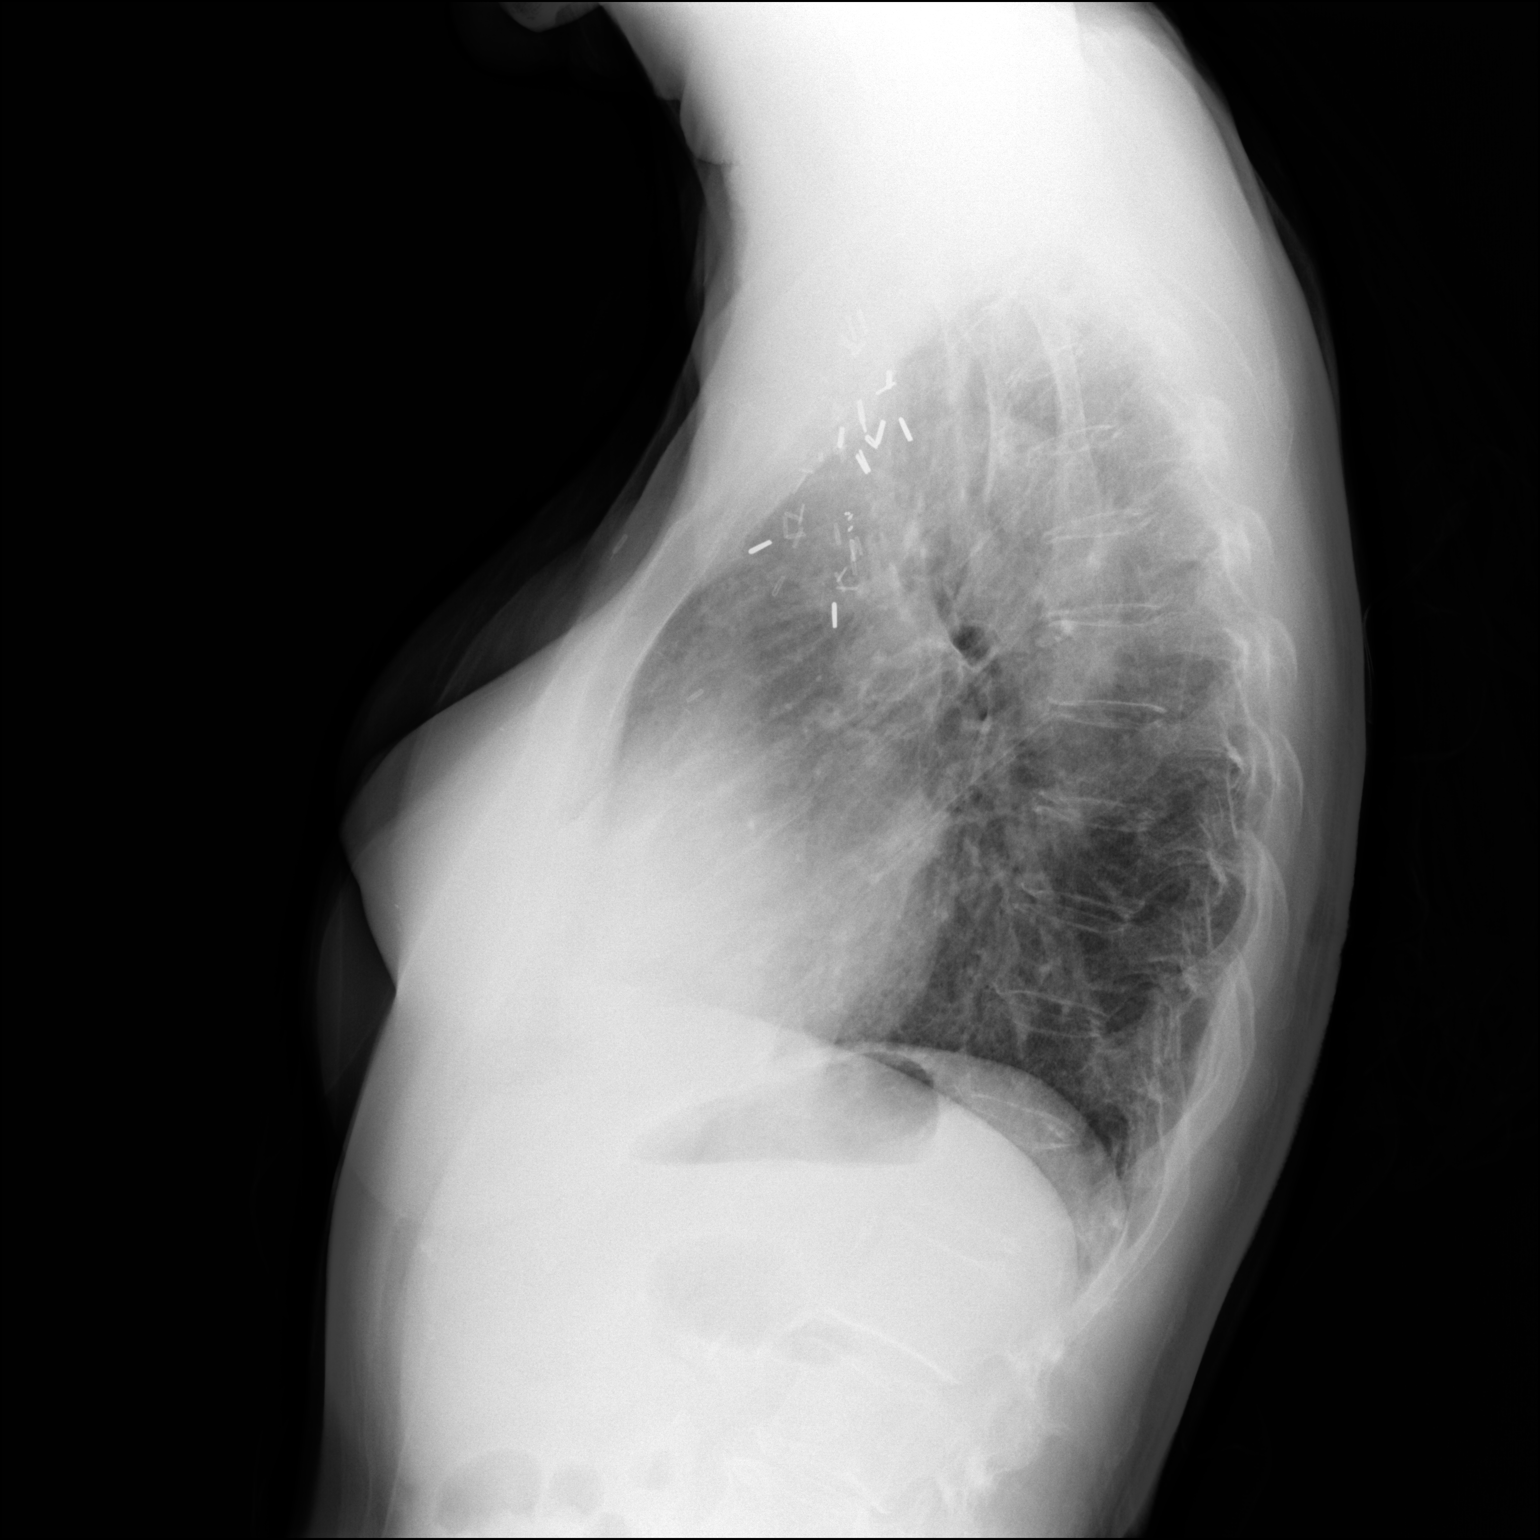

[2 of 2 positions shown; findings below may reference images not displayed]

FINDINGS: Scarring is seen in the apices. Haziness over the bases consistent
with overlapping soft tissues. No nodules or masses. No focal
infiltrates. Surgical clips are seen bilaterally, stable. No loop
recorder is identified.
IMPRESSION: No loop recorder is identified.

## 2020-02-26 ENCOUNTER — Ambulatory Visit: Payer: Medicare Other | Attending: Internal Medicine

## 2020-02-26 DIAGNOSIS — Z23 Encounter for immunization: Secondary | ICD-10-CM | POA: Insufficient documentation

## 2020-02-26 NOTE — Progress Notes (Signed)
   Covid-19 Vaccination Clinic  Name:  EMY TARANTINO    MRN: [id number] DOB: December 08, 1951  02/26/2020  Ms. Molloy was observed post Covid-19 immunization for 15 minutes without incident. She was provided with Vaccine Information Sheet and instruction to access the V-Safe system.   Ms. Ellifritz was instructed to call 911 with any severe reactions post vaccine: [name] Difficulty breathing  . Swelling of face and throat  . A fast heartbeat  . A bad rash all over body  . Dizziness and weakness   Immunizations Administered    Name Date Dose VIS Date Route   Pfizer COVID-19 Vaccine 02/26/2020 10:26 AM 0.3 mL 12/05/2019 Intramuscular   Manufacturer: [place]   Lot: [id number]   [place]: [id number]

## 2020-03-01 ENCOUNTER — Ambulatory Visit (INDEPENDENT_AMBULATORY_CARE_PROVIDER_SITE_OTHER): Payer: Medicare Other | Admitting: *Deleted

## 2020-03-01 DIAGNOSIS — I639 Cerebral infarction, unspecified: Secondary | ICD-10-CM | POA: Diagnosis not present

## 2020-03-01 LAB — CUP PACEART REMOTE DEVICE CHECK
Date Time Interrogation Session: 20210307230425
Implantable Pulse Generator Implant Date: 20200818

## 2020-03-02 NOTE — Progress Notes (Signed)
ILR Remote 

## 2020-03-23 ENCOUNTER — Other Ambulatory Visit: Payer: Self-pay

## 2020-03-23 ENCOUNTER — Encounter: Payer: Self-pay | Admitting: Adult Health

## 2020-03-23 ENCOUNTER — Ambulatory Visit (INDEPENDENT_AMBULATORY_CARE_PROVIDER_SITE_OTHER): Payer: Medicare Other | Admitting: Adult Health

## 2020-03-23 VITALS — BP 152/76 | HR 90 | Temp 97.8°F | Ht 66.0 in | Wt 137.0 lb

## 2020-03-23 DIAGNOSIS — F0631 Mood disorder due to known physiological condition with depressive features: Secondary | ICD-10-CM

## 2020-03-23 DIAGNOSIS — I10 Essential (primary) hypertension: Secondary | ICD-10-CM

## 2020-03-23 DIAGNOSIS — I69398 Other sequelae of cerebral infarction: Secondary | ICD-10-CM

## 2020-03-23 DIAGNOSIS — E785 Hyperlipidemia, unspecified: Secondary | ICD-10-CM | POA: Diagnosis not present

## 2020-03-23 DIAGNOSIS — F4323 Adjustment disorder with mixed anxiety and depressed mood: Secondary | ICD-10-CM

## 2020-03-23 DIAGNOSIS — I639 Cerebral infarction, unspecified: Secondary | ICD-10-CM

## 2020-03-23 NOTE — Progress Notes (Signed)
[facility] [address] [place]. [place] [address] [phone]       OFFICE FOLLOW UP NOTE  [name] Date of Birth:  08-05-1951 Medical Record Number:  [id number]   Reason for Referral: Cryptogenic stroke follow up    CHIEF COMPLAINT:  Chief Complaint  Patient presents with  . Follow-up    stroke f/u.  No residual functional deficits.  Ongoing poststroke depression/anxiety    HPI:   Today, 03/23/2020, [name] is here for follow-up regarding cryptogenic stroke.  She continues to be stable from a stroke standpoint without new or worsening stroke/TIA symptoms.  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  She continues on apixaban 5 mg twice daily without bleeding or bruising.  Continues on atorvastatin without myalgias.  Blood pressure today 152/76.  loop recorder is not shown atrial fibrillation thus far.  No further concerns at this time.      History: Provided for reference purposes only Update 11/24/2019 JM: [name] is a 69 year old female who is being seen today for stroke follow-up.  Since prior visit, on 08/11/2019, she presented to ED with 2 transient episodes of confusion along with apraxia.  MRI showed small cortical and subcortical right parietal lobe infarcts new from prior imaging with cryptogenic etiology.  Loop recorder planned on being reimplanted as it was inadvertently expelled but had not done so prior to new strokes.  Loop recorder replaced during admission.  She was recently enrolled in the Connie Meyer trial but unfortunately met study endpoint due to new strokes.  Due to recent qualification with Connie Meyer trial and 2 embolic strokes, recommended initiating Eliquis.  HTN stable.  LDL 67 and continued on atorvastatin 40 mg daily.  Prior history of breast cancer in remission for 20  years and recommended outpatient follow-up with Dr. Pamella Meyer outpatient for cancer recurrence screening with questionable hypercoagulable state with recurrent strokes.  Discharged home in stable condition. Since discharge, she has been doing well from a stroke standpoint without residual deficits or reoccurring of symptoms.  She was started on sertraline by PCP with improvement of depression but reports today that she self discontinued sertraline approximately 2 weeks ago due to feeling of flat affect and lack of emotion.  Since discontinuing, she has experienced slight worsening of depression/anxiety.  She was referred to therapy by PCP with evaluation scheduled but canceled visit as she felt depression stable.  She is interested at this time of pursuing therapy and plans on calling to schedule evaluation.  She also reports greater difficulty with short-term memory and delayed processing/recall.  She is also been having difficulty with insomnia currently on Ambien 5 mg nightly with mild benefit.  She also feels increased depression/anxiety related to current COVID-19 concerns and restrictions.  She also previously exercise frequently but has been fearful of exercising as she had reoccurring stroke after going for a run.  She has continued on apixaban 5 mg twice daily without bleeding or bruising.  Continues on atorvastatin without myalgias.  Blood pressure today 140/84.  Loop recorder showed 10 new false AF episodes without true AF reported.  No further concerns at this time.  Hospital admission 06/13/2019: [name] is a 69 y.o. female with no significant past medical hx other than previous breast cancer presenting with transient visual changes in the setting of recently diagnosed hypertension. She did not receive IV  t-PA due to late presentation (>4.5 hours from time of onset).  Stroke work-up revealed punctate acute/early subacute infarctions within the left occipital lobe embolic pattern  secondary to unknown source with residual superior homonymous quadrantanopia.  MRI showed punctate acute/early subacute infarctions within the left occipital lobe.  TCD and carotid Dopplers unremarkable.  2D echo unremarkable without cardiac source is identified.  TEE no cardiac source of embolus or PFO therefore loop recorder placed to assess for atrial fibrillation.  LDL 107 A1c 5.4.  Recommended DAPT for 3 weeks and aspirin alone.  Initiated atorvastatin 80 mg daily for HTN management.  HTN stable.  Other stroke risk factors include advanced age and EtOH use but no prior history of stroke.  Discharged home in stable condition without residual deficits.  Initial visit 07/23/2019: No residual deficits - she does question if vision slightly worse such as film over eyes -she plans on scheduling visit with ophthalmology Completed 3 weeks DAPT with recommendation of continuing aspirin alone but discontinued both aspirin and Plavix as she did not have additional refills Continues on atorvastatin without myalgias Blood pressure 150/90 - at home typically 130s Loop recorder apparently fell out shortly after implanted.  Cardiology aware and plans on replacing soon. No further concerns at this time.     ROS:   14 system review of systems performed and negative with exception of depression/anxiety, insomnia and memory loss  PMH:  Past Medical History:  Diagnosis Date  . Cancer ([place])    bilateral breast  . HTN (hypertension)   . Stroke [facility])     PSH:  Past Surgical History:  Procedure Laterality Date  . BREAST SURGERY    . BUBBLE STUDY  06/16/2019   Procedure: BUBBLE STUDY;  Surgeon: Fay Records, MD;  Location: [facility] ENDOSCOPY;  Service: Cardiovascular;;  . LOOP RECORDER INSERTION N/A 06/16/2019   Procedure: LOOP RECORDER INSERTION;  Surgeon: Evans Lance, MD;  Location: [place] CV LAB;  Service: Cardiovascular;  Laterality: N/A;  . LOOP RECORDER INSERTION N/A 08/12/2019   Procedure: LOOP  RECORDER INSERTION;  Surgeon: Thompson Grayer, MD;  Location: [place] CV LAB;  Service: Cardiovascular;  Laterality: N/A;  . TEE WITHOUT CARDIOVERSION N/A 06/16/2019   Procedure: TRANSESOPHAGEAL ECHOCARDIOGRAM (TEE);  Surgeon: Fay Records, MD;  Location: [facility] ENDOSCOPY;  Service: Cardiovascular;  Laterality: N/A;    Social History:  Social History   Socioeconomic History  . Marital status: Divorced    Spouse name: Not on file  . Number of children: 4  . Years of education: Not on file  . Highest education level: Not on file  Occupational History  . Not on file  Tobacco Use  . Smoking status: Never Smoker  . Smokeless tobacco: Never Used  Substance and Sexual Activity  . Alcohol use: Yes    Alcohol/week: 1.0 standard drinks    Types: 1 Glasses of wine per week  . Drug use: No  . Sexual activity: Never  Other Topics Concern  . Not on file  Social History Narrative  . Not on file   Social Determinants of Health   Financial Resource Strain:   . Difficulty of Paying Living Expenses:   Food Insecurity:   . Worried About Charity fundraiser in the Last Year:   . Arboriculturist in the Last Year:   Transportation Needs:   . Film/video editor (Medical):   [name] Lack of Transportation (Non-Medical):   Physical Activity:   . Days  of Exercise per Week:   . Minutes of Exercise per Session:   Stress:   . Feeling of Stress :   Social Connections:   . Frequency of Communication with Friends and Family:   . Frequency of Social Gatherings with Friends and Family:   . Attends Religious Services:   . Active Member of Clubs or Organizations:   . Attends Archivist Meetings:   [name] Marital Status:   Intimate Partner Violence:   . Fear of Current or Ex-Partner:   . Emotionally Abused:   [name] Physically Abused:   . Sexually Abused:     Family History:  Family History  Problem Relation Age of Onset  . Hypertension Mother   . Hypertension Father     Medications:    Current Outpatient Medications on File Prior to Visit  Medication Sig Dispense Refill  . amLODipine (NORVASC) 5 MG tablet Take 5 mg by mouth daily.    [name] apixaban (ELIQUIS) 5 MG TABS tablet Take 5 mg by mouth 2 (two) times daily.    [name] atorvastatin (LIPITOR) 40 MG tablet Take 1 tablet (40 mg total) by mouth daily at 6 PM. 90 tablet 3  . bimatoprost (LATISSE) 0.03 % ophthalmic solution 1 application daily.    . fluticasone (FLONASE) 50 MCG/ACT nasal spray SHAKE LIQUID AND USE 1 SPRAY IN EACH NOSTRIL TWICE DAILY 16 g 6  . zolpidem (AMBIEN) 5 MG tablet TAKE 1 TABLET(5 MG) BY MOUTH AT BEDTIME AS NEEDED FOR SLEEP 30 tablet 3   No current facility-administered medications on file prior to visit.    Allergies:  No Known Allergies   Physical Exam  Vitals:   03/23/20 1329  BP: (!) 152/76  Pulse: 90  Temp: 97.8 F (36.6 C)  Weight: 137 lb (62.1 kg)  Height: 5' 6"  (1.676 m)   Body mass index is 22.11 kg/m. No exam data present   General: well developed, well nourished,  pleasant middle-age Caucasian female, seated, tearful during visit Head: head normocephalic and atraumatic.   Neck: supple with no carotid or supraclavicular bruits Cardiovascular: regular rate and rhythm, no murmurs Musculoskeletal: no deformity Skin:  no rash/petichiae Vascular:  Normal pulses all extremities   Neurologic Exam Mental Status: Awake and fully alert.   Normal speech and language.  Oriented to place and time. Recent and remote memory slightly diminished. Attention span, concentration and fund of knowledge appropriate during visit.  Tearful with increased anxiety during visit Cranial Nerves: Pupils equal, briskly reactive to light. Extraocular movements full without nystagmus. Visual fields full to confrontation. Hearing intact. Facial sensation intact. Face, tongue, palate moves normally and symmetrically.  Motor: Normal bulk and tone. Normal strength in all tested extremity muscles. Sensory.: intact to  touch , pinprick , position and vibratory sensation.  Coordination: Rapid alternating movements normal in all extremities. Finger-to-nose and heel-to-shin performed accurately bilaterally. Gait and Station: Arises from chair without difficulty. Stance is normal. Gait demonstrates normal stride length and balance Reflexes: 1+ and symmetric. Toes downgoing.       ASSESSMENT: Connie Meyer is a 69 y.o. year old female here with reoccurring cryptogenic strokes recently 08/11/2019 small cortical and subcortical infarcts in the right parietal lobe and punctate acute/early subacute infarctions within the left occipital lobe embolic pattern on 0/16/5537.  Loop recorder inadvertently expelled and had plans on following up with cardiology for replacement but not completed prior to August admission.  She was enrolled in Connie Meyer trial but due to reoccurring stroke, study  endpoint met due to reoccurring stroke.  Initiated Eliquis due to reoccurring likely cardioembolic etiology stroke.  Loop recorder replaced during admission which has not shown atrial fibrillation thus far.  Vascular risk factors include HTN, HLD and prior EtOH use.  She continues to experience difficulty with poststroke depression/anxiety, worsening chronic insomnia and subjective short-term memory impairment    PLAN:  1. Cryptogenic stroke:  -Continue Eliquis (apixaban) daily  and continue atorvastatin 80 mg daily for secondary stroke prevention.  Qualified for Eliquis due to meeting criteria for Connie Meyer trial and reoccurring cortical strokes with undetermined etiology.   -Continue to monitor loop recorder for possible atrial fibrillation -Maintain strict control of hypertension with blood pressure goal below 130/90, diabetes with hemoglobin A1c goal below 6.5% and cholesterol with LDL cholesterol (bad cholesterol) goal below 70 mg/dL.  I also advised the patient to eat a healthy diet with plenty of whole grains, cereals, fruits and  vegetables, exercise regularly with at least 30 minutes of continuous activity daily and maintain ideal body weight.   2. HTN: Stable.  Managed by PCP 3. HLD: Ongoing use of atorvastatin management prescribed by PCP 4. Adjustment/reactive depression/anxiety, post stroke -Continues to decline interested in antidepressants as previously on sertraline and self discontinued due to side effects -Previously declined interest in psychology/psychiatry but is interested in evaluation by neuropsychology for ongoing difficulty adjusting post stroke as well as evaluation of short-term memory concerns questionable stroke related vs depression/anxiety related   Follow up in 4 months or call earlier if needed   I spent 26 minutes of face-to-face and non-face-to-face time with patient.  This included previsit chart review, lab review, study review, order entry, electronic health record documentation, patient education regarding prior cryptogenic strokes and ongoing difficulty with depression/anxiety   Frann Rider, AGNP-BC  [facility] [address] [place], [place] [address]  Phone [phone] Fax [phone] Note: This document was prepared with digital dictation and possible smart phrase technology. Any transcriptional errors that result from this process are unintentional.

## 2020-03-23 NOTE — Patient Instructions (Signed)
Continue Eliquis (apixaban) daily  and atorvastatin  for secondary stroke prevention  Continue to follow up with PCP regarding cholesterol and blood pressure management   Loop recorder will continued to be monitored for atrial fibrillation   Continue to monitor blood pressure at home  Maintain strict control of hypertension with blood pressure goal below 130/90, diabetes with hemoglobin A1c goal below 6.5% and cholesterol with LDL cholesterol (bad cholesterol) goal below 70 mg/dL. I also advised the patient to eat a healthy diet with plenty of whole grains, cereals, fruits and vegetables, exercise regularly and maintain ideal body weight.  Followup in the future with me in 4 months or call earlier if needed       Thank you for coming to see [place] at [facility]. I hope we have been able to provide you high quality care today.  You may receive a patient satisfaction survey over the next few weeks. We would appreciate your feedback and comments so that we may continue to improve ourselves and the health of our patients.

## 2020-03-26 NOTE — Progress Notes (Signed)
I agree with the above plan 

## 2020-04-01 ENCOUNTER — Ambulatory Visit (INDEPENDENT_AMBULATORY_CARE_PROVIDER_SITE_OTHER): Payer: Medicare Other | Admitting: *Deleted

## 2020-04-01 DIAGNOSIS — I639 Cerebral infarction, unspecified: Secondary | ICD-10-CM

## 2020-04-01 LAB — CUP PACEART REMOTE DEVICE CHECK
Date Time Interrogation Session: 20210407230223
Implantable Pulse Generator Implant Date: 20200818

## 2020-04-01 NOTE — Progress Notes (Signed)
ILR Remote 

## 2020-05-02 LAB — CUP PACEART REMOTE DEVICE CHECK
Date Time Interrogation Session: 20210508230038
Implantable Pulse Generator Implant Date: 20200818

## 2020-05-04 ENCOUNTER — Ambulatory Visit (INDEPENDENT_AMBULATORY_CARE_PROVIDER_SITE_OTHER): Payer: Medicare Other | Admitting: *Deleted

## 2020-05-04 DIAGNOSIS — I6300 Cerebral infarction due to thrombosis of unspecified precerebral artery: Secondary | ICD-10-CM | POA: Diagnosis not present

## 2020-05-05 NOTE — Progress Notes (Signed)
Carelink Summary Report / Loop Recorder 

## 2020-05-12 ENCOUNTER — Encounter: Payer: Self-pay | Admitting: Psychology

## 2020-05-12 ENCOUNTER — Ambulatory Visit (INDEPENDENT_AMBULATORY_CARE_PROVIDER_SITE_OTHER): Payer: Medicare Other | Admitting: Psychology

## 2020-05-12 ENCOUNTER — Ambulatory Visit: Payer: Medicare Other | Admitting: Psychology

## 2020-05-12 ENCOUNTER — Other Ambulatory Visit: Payer: Self-pay

## 2020-05-12 DIAGNOSIS — F5104 Psychophysiologic insomnia: Secondary | ICD-10-CM

## 2020-05-12 DIAGNOSIS — F4323 Adjustment disorder with mixed anxiety and depressed mood: Secondary | ICD-10-CM

## 2020-05-12 DIAGNOSIS — R4189 Other symptoms and signs involving cognitive functions and awareness: Secondary | ICD-10-CM

## 2020-05-12 DIAGNOSIS — I6300 Cerebral infarction due to thrombosis of unspecified precerebral artery: Secondary | ICD-10-CM

## 2020-05-12 NOTE — Progress Notes (Signed)
NEUROPSYCHOLOGICAL EVALUATION [place] [place] Department of Neurology  Date of Evaluation: May 12, 2020  Reason for Referral:   Connie Meyer is a 69 y.o. 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   Assessment and Plan:   Clinical Impression(s): Connie Meyer pattern of performance is suggestive of neuropsychological functioning largely within normal limits. Weaknesses were exhibited across encoding (i.e., learning) aspects of verbal memory measures. However, behavioral observations were noteworthy for her being somewhat overwhelmed by the presentation of large amounts of information and she appeared to give up easily across these tasks. Despite trouble learning information at times, delayed recall and recognition of both verbal and visual information was appropriate, thus not suggesting true memory impairment. An isolated weakness was also noted across one visuospatial task assessing motorized visuospatial manipulation. However, performance across all other visuospatial/constructional tasks were appropriate. Additionally, performance was appropriate across domains of processing speed, attention/concentration, executive functioning, and receptive and expressive language. Connie Meyer denied difficulties completing instrumental activities of daily living (ADLs) independently.  Across mood-related questionnaires, she reported acutely severe symptoms of both anxiety and depression. She also reported ongoing moderate sleep dysfunction. Severe and poorly managed psychiatric distress can certainly impact cognitive functioning and create/maintain subjective cognitive complaints surrounding processing speed,  attention/concentration, executive functioning, and encoding/retrieval aspects of memory. It is likely that these symptoms represent the primary etiology for objective weaknesses and subjective dysfunction, with sleep dysfunction being a secondary but still substantial culprit. Given her stroke history, there remains the potential for a very subtle underlying vascular etiology. However, mood and sleep concerns are paramount at the present time.   Despite some trouble learning verbal information, Connie Meyer was able to retain previously learned information after lengthy delays. Overall, memory performance combined with intact performances across other areas of cognitive functioning is not suggestive of Alzheimer's disease. Likewise, her cognitive and behavioral profile is not suggestive of any other form of neurodegenerative illness presently.  Recommendations: A combination of medication and psychotherapy has been shown to be most effective at treating symptoms of anxiety and depression. As such, Connie Meyer is encouraged to speak with her prescribing physician regarding medication adjustments to optimally manage these symptoms.   Likewise, Connie Meyer is encouraged to consider engaging in short-term psychotherapy to address symptoms of psychiatric distress. She would benefit from an active and collaborative therapeutic environment, rather than one purely supportive in nature. Recommended treatment modalities include Cognitive Behavioral Therapy (CBT) or Acceptance and Commitment Therapy (ACT). I will place an updated referral with [facility] should she decide to pursue this form of treatment.  She reported some visual disturbances where her eyes will feel "crossed" to the extent that she will ask people around her if they appear that way. She also described a "heaviness" to her eyes and was unsure if her eyelids were drooping. Given these symptoms and the location of her previous  infracts, a referral for an evaluation by a neuro-ophthalmologist appears reasonable. I do not see any prior referrals in her chart. As such, I will place a referral for this evaluation.  She reported significant symptoms of insomnia, perhaps largely due to the presence of racing thoughts and the inability to relax. Should the alleviation of mood symptoms not improve her sleep, she would likely benefit from  additional psychotherapy to address these symptoms in the form of Cognitive Behavioral Therapy for Insomnia (CBT-I). She should also discuss medication changes with her PCP. Cognitive side effects can certainly accompany the use of Ambien and there may be more effective medications without these side effects worth trialing.  Connie Meyer is encouraged to attend to lifestyle factors for brain health (e.g., regular physical exercise, good nutrition habits, regular participation in cognitively-stimulating activities, and general stress management techniques), which are likely to have benefits for both emotional adjustment and cognition. In fact, in addition to promoting good general health, regular exercise incorporating aerobic activities (e.g., brisk walking, jogging, cycling, etc.) has been demonstrated to be a very effective treatment for depression and stress, with similar efficacy rates to both antidepressant medication and psychotherapy. Optimal control of vascular risk factors (including safe cardiovascular exercise and adherence to dietary recommendations) is encouraged.   When learning new information, she would benefit from information being broken up into small, manageable pieces. She may also find it helpful to articulate the material in her own words and in a context to promote encoding at the onset of a new task. This material may need to be repeated multiple times to promote encoding.  Memory can be improved using internal strategies such as rehearsal, repetition, chunking, mnemonics,  association, and imagery. External strategies such as written notes in a consistently used memory journal, visual and nonverbal auditory cues such as a calendar on the refrigerator or appointments with alarm, such as on a cell phone, can also help maximize recall.    Reducing anxiety may also aid in the retrieval of information. Ms. Lafollette is encouraged to prepare scripts she can use socially when she experiences difficulty with word finding or memory. Such scripts should be brief explanations of the difficulty (e.g., "the word escapes me now") and allow her to move the conversation forward quickly rather than dwelling on the issue.  Review of Records:   Ms. Garrow was seen by [facility] Frann Rider, NP) on 03/23/2020 for follow-up of cryptogenic stroke. Briefly, she presented to the ED with transient visual changes in the setting of recently diagnosed hypertension on 06/13/2019.Stroke work-up revealed punctate acute/early subacute infarctions within the left occipital lobe, embolic pattern secondary to unknown source with residual superior homonymous quadrantanopia. Shedid not receive IV tPA due to late presentation (>4.5 hours from time of onset). TCD and carotid Dopplers were unremarkable. 2D echo was also unremarkable without a cardiac source identified. A loop recorder was placed to assess for atrial fibrillation. She was ultimately discharged home in stable condition without residual deficits.  On 08/11/2019, she again presented to the ED with two transient episodes of confusion along with apraxia. Brain MRI showed small cortical and subcortical right parietal lobe infarcts new from prior imaging with cryptogenic etiology. Hypertension was said to be stable. She was ultimately discharged home in stable condition. Since discharge, Ms. Donchez was said to be doing well from a stroke standpoint without residual deficits or reoccurring symptoms. She was started on sertraline  by her PCP with improvement of depression but later reported that she self discontinued this medication due to feeling a flat affect and lack of emotion. Since discontinuing, she has experienced a slight worsening of depression/anxiety. She was also referred for psychotherapy by her PCP; however, this appointment was said to be canceled as Ms. Zara felt depressive symptoms were stable. Regarding cognition, she reported greater difficulty with short-term memory and delayed processing/recall. She has also been having difficulty  with insomnia and is currently taking Ambien 5 mg nightly with mild benefit. Ultimately, Ms. Langelier was referred for a comprehensive neuropsychological evaluation to characterize her cognitive abilities and to assist with diagnostic clarity and treatment planning.   Brain MRI on 06/14/2019 revealed mild-to-moderate chronic microvascular ischemic changes and volume loss of the brain, as well as a small group of punctate acute/early subacute infarctions within the left occipital lobe. Brain MRI on 08/11/2019 revealed scattered small cortical and subcortical infarcts in the right parietal lobe. It also noted the resolution of her prior left occipital lobe infarct (consider cerebral emboli).   Past Medical History:  Diagnosis Date  . Adjustment disorder with mixed anxiety and depressed mood 09/15/2019  . CVA (cerebral vascular accident) 08/11/2019   small cortical and subcortical right parietal lobe infarcts of cryptogenic etiology  . Dyslipidemia 08/12/2019  . History of bilateral mastectomy   . 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 No further therapy. Currently in remission.  [name] HTN (hypertension)   . Hypokalemia 06/14/2019  . Impingement syndrome of right shoulder 01/27/2020  . Insomnia 09/15/2019    Past Surgical History:  Procedure Laterality Date  . BREAST SURGERY    . BUBBLE STUDY  06/16/2019   Procedure: BUBBLE STUDY;   Surgeon: Fay Records, MD;  Location[facility] ENDOSCOPY;  Service: Cardiovascular;;  . LOOP RECORDER INSERTION N/A 06/16/2019   Procedure: LOOP RECORDER INSERTION;  Surgeon: Evans Lance, MD;  Location: [facility] CV LAB;  Service: Cardiovascular;  Laterality: N/A;  . LOOP RECORDER INSERTION N/A 08/12/2019   Procedure: LOOP RECORDER INSERTION;  Surgeon: Thompson Grayer, MD;  Location: [facility] LAB;  Service: Cardiovascular;  Laterality: N/A;  . TEE WITHOUT CARDIOVERSION N/A 06/16/2019   Procedure: TRANSESOPHAGEAL ECHOCARDIOGRAM (TEE);  Surgeon: Fay Records, MD;  Location: [facility] ENDOSCOPY;  Service: Cardiovascular;  Laterality: N/A;    Current Outpatient Medications:  .  amLODipine (NORVASC) 5 MG tablet, Take 5 mg by mouth daily., Disp: , Rfl:  .  apixaban (ELIQUIS) 5 MG TABS tablet, Take 5 mg by mouth 2 (two) times daily., Disp: , Rfl:  .  atorvastatin (LIPITOR) 40 MG tablet, Take 1 tablet (40 mg total) by mouth daily at 6 PM., Disp: 90 tablet, Rfl: 3 .  bimatoprost (LATISSE) 0.03 % ophthalmic solution, 1 application daily., Disp: , Rfl:  .  fluticasone (FLONASE) 50 MCG/ACT nasal spray, SHAKE LIQUID AND USE 1 SPRAY IN EACH NOSTRIL TWICE DAILY, Disp: 16 g, Rfl: 6 .  zolpidem (AMBIEN) 5 MG tablet, TAKE 1 TABLET(5 MG) BY MOUTH AT BEDTIME AS NEEDED FOR SLEEP, Disp: 30 tablet, Rfl: 3  Clinical Interview:   Cognitive Symptoms: Decreased short-term memory: Endorsed. She reported generalized symptoms of forgetfulness. When provided specific examples, she endorsed trouble remembering the details of conversations and misplacing things around her home.  Decreased long-term memory: Denied. Decreased attention/concentration: Endorsed. This was said to represent her biggest concern and area of cognitive decline. She reported prominent difficulties maintaining her focus/concentration, increased ease of distractibility, and frequently losing her train of thought.  Reduced processing speed:  Endorsed. Difficulties with executive functions: Denied. She further denied overt personality changes.  Difficulties with emotion regulation: Denied. Difficulties with receptive language: Denied. Difficulties with word finding: Endorsed. She reported prominent word finding difficulties. These are likely at least partially influenced by stress/anxiety as she noted that the words will come to her with time, but generally when she is no longer  in that situation or setting.  Decreased visuoperceptual ability: Denied.  Trajectory of deficits: Cognitive deficits were said to first emerge following her stroke experiences in June and August 2020. She reported feeling that short-term memory abilities have gradually worsened over that time.   Difficulties completing ADLs: Denied. However, while driving, she reported feeling the need to focus far more intensely on the act of driving relative to prior to her strokes.   Additional Medical History: History of traumatic brain injury/concussion: Denied. History of stroke: Endorsed (see above).  History of seizure activity: Denied. History of known exposure to toxins: Denied. Symptoms of chronic pain: Denied. Experience of frequent headaches/migraines: Denied. Frequent instances of dizziness/vertigo: Endorsed. She reported "periods" of increased dizziness/lightheadedness, more pronounced lately. Symptoms were said to emerge "at any time" rather than isolated to when she stands up quickly.   Sensory changes: She described instances where her eyes will feel "crossed" to the extent that she will ask people around her if they appear that way. She also described a "heaviness" to her eyes and was unsure if her eyelids were drooping. She denied this being a painful sensation, but did describe it as "annoying" and that she is always aware of it. She further reported mildly diminished hearing. Other sensory changes/difficulties (e.g., taste or smell) were denied.   Balance/coordination difficulties: Denied. She also denied a history of falls.  Other motor difficulties: Denied.  Other medical conditions: Medical records suggest a history of breast cancer, with diagnosis and treatment (surgical) occurring in 2003. Residual cognitive symptoms from breast cancer treatment were denied.   Sleep History: Estimated hours obtained each night: 4 hours. Difficulties falling asleep: Endorsed. She reported significant symptoms of insomnia despite taking Ambien at night. She noted "fighting" this medication, stating that she feels like she "doesn't let [her]self fall asleep" due to "thoughts running non-stop" and that she has a hard time relaxing.  Difficulties staying asleep: Denied. Feels rested and refreshed upon awakening: Denied.  History of snoring: Unknown. History of waking up gasping for air: Denied. Witnessed breath cessation while asleep: Denied.  History of vivid dreaming: Denied. Excessive movement while asleep: Denied. Instances of acting out her dreams: Denied.  Psychiatric/Behavioral Health History: Depression: Endorsed. Since her stroke, she described her mood as "up and down" and acknowledged symptoms of depression. She denied current medication intervention, stating that prior attempts left her feeling flat and unable to experience the highs and lows of the normal human emotional experience. She denied current or previous psychotherapy involvement and depressive symptoms prior to her stroke were denied. She also denied current or remote suicidal ideation, intent, or plan. Anxiety: Endorsed. Symptoms were said to accompany depressive symptoms and have been present since her strokes. She denied excessive anxiety prior to these events.  Mania: Denied. Trauma History: Denied. Visual/auditory hallucinations: Denied. Delusional thoughts: Denied.  Tobacco: Denied. Alcohol: She reported very rare alcohol consumption and denied a history of  problematic alcohol abuse or dependence.  Recreational drugs: She reported sporadic use of marijuana as a means to help her sleep.  Caffeine: 1-2 cups of coffee in the morning.   Family History: Problem Relation Age of Onset  . Hypertension Mother   . Hypertension Father    This information was confirmed by Ms. Bancroft.  Academic/Vocational History: Highest level of educational attainment: 15 years. She reported several different stints in college, ultimately earning enough credits to count for approximately three years of school. She did not earn a Bachelor's degree. She described herself as  a strong (A/B) student in academic settings. No relative weaknesses were reported.  History of developmental delay: Denied. History of grade repetition: Denied. Enrollment in special education courses: Denied. History of LD/ADHD: Denied.  Employment: Retired. She previously worked as a Catering manager for 30 years.   Evaluation Results:   Behavioral Observations: Ms. Phair was unaccompanied, arrived to her appointment on time, and was appropriately dressed and groomed. She appeared alert and oriented. Observed gait and station were within normal limits. Gross motor functioning appeared intact upon informal observation and no abnormal movements (e.g., tremors) were noted. Her affect was generally positive, but did range appropriately given the subject being discussed during the clinical interview or the task at hand during testing procedures. There were a few instances during the interview where she appeared close to tears when discussing ongoing difficulties. She also reported acute symptoms of anxiety surrounding upcoming testing procedures. Spontaneous speech was fluent. Mild word finding difficulties were observed during the clinical interview. Thought processes were coherent, organized, and normal in content. Insight into her cognitive difficulties appeared limited in that subjective complaints  outweighed objective evidence of deficits across testing. During testing, sustained attention was appropriate. Task engagement was generally adequate. However, there were several instances when she appeared to give up prematurely and persistence was limited Medical illustrator). She also had trouble comprehending WCST instructions and appeared to get irritated towards the examiner during that test. Overall, Ms. Redner was cooperative with the clinical interview and subsequent testing procedures.   Adequacy of Effort: The validity of neuropsychological testing is limited by the extent to which the individual being tested may be assumed to have exerted adequate effort during testing. Ms. Vanbuskirk expressed her intention to perform to the best of her abilities and exhibited adequate task engagement and persistence. Scores across stand-alone and embedded performance validity measures were within expectation. As such, the results of the current evaluation are believed to be a valid representation of Ms. Teems's current cognitive functioning.  Test Results: Ms. Ephraim was fully oriented at the time of the current evaluation.  Intellectual abilities based upon educational and vocational attainment were estimated to be in the average range. Premorbid abilities were estimated to be within the average range based upon a single-word reading test.   Processing speed was average to above average. Basic attention was average. More complex attention (e.g., working memory) was also average. Executive functioning was mildly variable but overall appropriate, ranging from the below average to above average normative ranges.  Assessed receptive language abilities were average. Likewise, Ms. Felicia did not exhibit any difficulties comprehending task instructions and answered all questions asked of her appropriately. Sentence repetition was below average, while performance on a semantic  knowledge screening test was within normal limits. Assessed expressive language (e.g., verbal fluency and confrontation naming) was average to above average.     Assessed visuospatial/visuoconstructional abilities were largely average to above average. An isolated weakness was exhibited across a task assessing motorized spatial manipulation.    Learning (i.e., encoding) of novel information was well below average to below average across verbal tasks and average across a visual task. Spontaneous delayed recall (i.e., retrieval) of previously learned information was average to above average. Retention rates were 100% across a story learning task, 75-88% across a list learning task, and 100% across a shape learning task. Performance across recognition tasks was strong, suggesting evidence for information consolidation.   Results of emotional screening instruments suggested that recent symptoms of generalized anxiety were in  the severe range, while symptoms of depression were also within the severe range. A screening instrument assessing recent sleep quality suggested the presence of moderate sleep dysfunction.  Tables of Scores:   Note: This summary of test scores accompanies the interpretive report and should not be considered in isolation without reference to the appropriate sections in the text. Descriptors are based on appropriate normative data and may be adjusted based on clinical judgment. The terms "impaired" and "within normal limits (WNL)" are used when a more specific level of functioning cannot be determined.       Effort Testing:   DESCRIPTOR       ACS Word Choice: --- --- Within Expectation  CVLT-III Forced Choice Recognition: --- --- Within Expectation  BVMT-R Retention Percentage: --- --- Within Expectation  D-KEFS Color Word Effort Index: --- --- Within Expectation       Orientation:      Raw Score Percentile   NAB Orientation, Form 1 29/29 --- ---       Intellectual Functioning:            Standard Score Percentile   Test of Premorbid Functioning: [id number] Average       Memory:          Wechsler Memory Scale (WMS-IV):                       Raw Score (Scaled Score) Percentile     Logical Memory I 18/53 (5) 5 Well Below Average    Logical Memory II 14/39 (8) 25 Average    Logical Memory Recognition 18/23 26-50 Average       [place] Verbal Learning Test (CVLT-III) Brief Form: Raw Score (Scaled/Standard Score) Percentile     Total Trials 1-4 23/36 (87) 19 Below Average    Short-Delay Free Recall 8/9 (12) 75 Above Average    Long-Delay Free Recall 6/9 (9) 37 Average    Long-Delay Cued Recall 7/9 (10) 50 Average      Recognition Hits 8/9 (9) 37 Average      False Positive Errors 1 (9) 37 Average       Brief Visuospatial Memory Test (BVMT-R), Form 1: Raw Score (T Score) Percentile     Total Trials 1-3 18/36 (45) 31 Average    Delayed Recall 9/12 (54) 66 Average    Recognition Discrimination Index 6 >16 Within Normal Limits      Recognition Hits 6/6 >16 Within Normal Limits      False Positive Errors 0 >16 Within Normal Limits        Attention/Executive Function:          Trail Making Test (TMT): Raw Score (T Score) Percentile     Part A 30 secs.,  0 errors (51) 54 Average    Part B 100 secs.,  1 error (44) 27 Average         Scaled Score Percentile   WAIS-IV Coding: 11 63 Average       NAB Attention Module, Form 1: T Score Percentile     Digits Forward 43 25 Average    Digits Backwards 43 25 Average       D-KEFS Color-Word Interference Test: Raw Score (Scaled Score) Percentile     Color Naming 29 secs. (12) 75 Above Average    Word Reading 18 secs. (13) 84 Above Average    Inhibition 68 secs. (10) 50 Average      Total Errors 0 errors (12) 75 Above Average  Inhibition/Switching 64 secs. (12) 75 Above Average      Total Errors 2 errors (11) 63 Average       D-KEFS 20 Questions Test: Scaled Score Percentile     Total Weighted Achievement  Score 14 91 Above Average    Initial Abstraction Score 11 63 Average       [place] Card Sorting Test [facility]): Raw Score Percentile     Categories (trials) 2 (64) 11-16 Below Average    Total Errors 17 38 Average    Perseverative Errors 6 69 Average    Non-Perseverative Errors 11 14 Below Average    Failure to Maintain Set 2 --- ---       Language:           Raw Score Percentile   PPVT Screening Instrument: 16/16 --- Within Expectation  Sentence Repetition: 13/22 11 Below Average       Verbal Fluency Test: Raw Score (T Score) Percentile     Phonemic Fluency (FAS) 54 (62) 88 Above Average    Animal Fluency 17 (46) 34 Average        NAB Language Module, Form 1: T Score Percentile     Auditory Comprehension 55 69 Average    Naming 31/31 (57) 75 Above Average       Visuospatial/Visuoconstruction:      Raw Score Percentile   Clock Drawing: 10/10 --- Within Normal Limits       NAB Spatial Module, Form 1: Standard Score/ T Score Percentile   Total Score 100 50 Average    Visual Discrimination 62 88 Above Average    Design Construction 33 5 Well Below Average    Figure Drawing Copy 58 79 Above Average    Map Reading 48 42 Average       Mood and Personality:      Raw Score Percentile   Geriatric Depression Scale: 20 --- Severe  Geriatric Anxiety Scale: 35 --- Severe    Somatic 14 --- Severe    Cognitive 11 --- Severe    Affective 10 --- Severe       Additional Questionnaires:      Raw Score Percentile   PROMIS Sleep Disturbance Questionnaire: 35 --- Moderate   Informed Consent and Coding/Compliance:   Ms. Sankovich was provided with a verbal description of the nature and purpose of the present neuropsychological evaluation. Also reviewed were the foreseeable risks and/or discomforts and benefits of the procedure, limits of confidentiality, and mandatory reporting requirements of this provider. The patient was given the opportunity to ask questions and receive answers about  the evaluation. Oral consent to participate was provided by the patient.   This evaluation was conducted by Christia Reading, Ph.D., licensed clinical neuropsychologist. Ms. Goya completed a comprehensive clinical interview with Dr. Melvyn Novas, billed as one unit [phone], and 165 minutes of cognitive testing and scoring, billed as one unit [phone] and five additional units [id number]. Psychometrist Milana Kidney, B.S., assisted Dr. Melvyn Novas with test administration and scoring procedures. As a separate and discrete service, Dr. Melvyn Novas spent a total of 180 minutes in interpretation and report writing billed as one unit [phone] and two units [id number].

## 2020-05-12 NOTE — Progress Notes (Signed)
   Psychometrician Note   Cognitive testing was administered to [employer] by Milana Kidney, B.S. (psychometrist) under the supervision of Dr. Christia Reading, Ph.D., licensed psychologist on 05/12/20. Connie Meyer did not appear overtly distressed by the testing session per behavioral observation or responses across self-report questionnaires. Dr. Christia Reading, Ph.D. checked in with Connie Meyer as needed to manage any distress related to testing procedures (if applicable). Rest breaks were offered.    The battery of tests administered was selected by Dr. Christia Reading, Ph.D. with consideration to Connie Meyer's current level of functioning, the nature of her symptoms, emotional and behavioral responses during interview, level of literacy, observed level of motivation/effort, and the nature of the referral question. This battery was communicated to the psychometrist. Communication between Dr. Christia Reading, Ph.D. and the psychometrist was ongoing throughout the evaluation and Dr. Christia Reading, Ph.D. was immediately accessible at all times. Dr. Christia Reading, Ph.D. provided supervision to the psychometrist on the date of this service to the extent necessary to assure the quality of all services provided.    Connie Meyer will return within approximately 1-2 weeks for an interactive feedback session with Dr. Melvyn Novas at which time her test performances, clinical impressions, and treatment recommendations will be reviewed in detail. Connie Meyer understands she can contact our office should she require our assistance before this time.  A total of 165 minutes of billable time were spent face-to-face with Connie Meyer by the psychometrist. This includes both test administration and scoring time. Billing for these services is reflected in the clinical report generated by Dr. Christia Reading, Ph.D..  This note reflects time spent with the psychometrician and does not include test  scores or any clinical interpretations made by Dr. Melvyn Novas. The full report will follow in a separate note.

## 2020-05-19 ENCOUNTER — Ambulatory Visit (INDEPENDENT_AMBULATORY_CARE_PROVIDER_SITE_OTHER): Payer: Medicare Other | Admitting: Psychology

## 2020-05-19 ENCOUNTER — Other Ambulatory Visit: Payer: Self-pay

## 2020-05-19 DIAGNOSIS — I639 Cerebral infarction, unspecified: Secondary | ICD-10-CM

## 2020-05-19 DIAGNOSIS — F4323 Adjustment disorder with mixed anxiety and depressed mood: Secondary | ICD-10-CM

## 2020-05-19 NOTE — Progress Notes (Signed)
   Neuropsychology Feedback Session Connie Meyer. [facility]  Reason for Referral:   Connie Meyer a 69 y.o. 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   Feedback:   Connie Meyer completed a comprehensive neuropsychological evaluation on 05/12/2020. Please refer to that encounter for the full report and recommendations. Briefly, results suggested neuropsychological functioning largely within normal limits. Weaknesses were exhibited across encoding (i.e., learning) aspects of verbal memory measures. However, behavioral observations were noteworthy for her being somewhat overwhelmed by the presentation of large amounts of information and she appeared to give up easily across these tasks. Despite trouble learning information at times, delayed recall and recognition of both verbal and visual information was appropriate, thus not suggesting true memory impairment. Across mood-related questionnaires, she reported acutely severe symptoms of both anxiety and depression. She also reported ongoing moderate sleep dysfunction. Severe and poorly managed psychiatric distress can certainly impact cognitive functioning and create/maintain subjective cognitive complaints surrounding processing speed, attention/concentration, executive functioning, and encoding/retrieval aspects of memory. It is likely that these symptoms represent the primary etiology for objective weaknesses and subjective dysfunction, with sleep dysfunction being a secondary but still substantial culprit.  Connie Meyer was unaccompanied during the current telephone call. Content of the current session focused on the results of her neuropsychological evaluation.  Connie Meyer was given the opportunity to ask questions and her questions were answered. She was encouraged to reach out should additional questions arise. Her report is available to her on MyChart.      Less than 16 minutes were spent conducting the current feedback session with Connie Meyer.

## 2020-05-21 ENCOUNTER — Other Ambulatory Visit: Payer: Self-pay | Admitting: Family Medicine

## 2020-05-21 NOTE — Telephone Encounter (Signed)
Requested medication (s) are due for refill today: yes  Requested medication (s) are on the active medication list: yes  Last refill:  01/29/20  #30  3 refills  Future visit scheduled:No needs appointment  Notes to clinic: Not delegated    Requested Prescriptions  Pending Prescriptions Disp Refills   zolpidem (AMBIEN) 5 MG tablet [Pharmacy Med Name: ZOLPIDEM 5MG  TABLETS] 30 tablet     Sig: TAKE 1 TABLET(5 MG) BY MOUTH AT BEDTIME AS NEEDED FOR SLEEP      Not Delegated - Psychiatry:  Anxiolytics/Hypnotics Failed - 05/21/2020 11:23 AM      Failed - This refill cannot be delegated      Failed - Urine Drug Screen completed in last 360 days.      Failed - Valid encounter within last 6 months    Recent Outpatient Visits           7 months ago Adjustment disorder with mixed anxiety and depressed mood   Primary Care at [facility], Lilia Argue, MD   8 months ago Adjustment disorder with mixed anxiety and depressed mood   [facility], Lilia Argue, MD   8 months ago Insomnia, unspecified type   [facility] at [facility], Lilia Argue, MD   10 months ago Ischemic stroke [facility])   [facility] at [facility], Lilia Argue, MD   11 months ago Ischemic stroke [facility])   Primary Care at [facility], Arlie Solomons, MD

## 2020-05-21 NOTE — Telephone Encounter (Signed)
Patient is requesting a refill of the following medications: Requested Prescriptions   Pending Prescriptions Disp Refills   zolpidem (AMBIEN) 5 MG tablet [Pharmacy Med Name: ZOLPIDEM 5MG  TABLETS] 30 tablet     Sig: TAKE 1 TABLET(5 MG) BY MOUTH AT BEDTIME AS NEEDED FOR SLEEP    Date of patient request: 05/21/2020 Last office visit: 10/16/2019 Date of last refill: 02/18/2020 Last refill amount: 30 Follow up time period per chart: 2 months

## 2020-05-23 NOTE — Telephone Encounter (Signed)
pmp reviewd, appropriate meds refilled 

## 2020-05-24 ENCOUNTER — Other Ambulatory Visit: Payer: Self-pay | Admitting: Family Medicine

## 2020-05-25 NOTE — Telephone Encounter (Signed)
No further refills without office visit 

## 2020-05-25 NOTE — Telephone Encounter (Signed)
Please schedule patient an appt 30 day supply has been sent into the pharmacy. No further refills without an appt

## 2020-05-25 NOTE — Telephone Encounter (Signed)
LVMTCB for pt to sch med refill appt

## 2020-05-25 NOTE — Telephone Encounter (Signed)
Requested medication (s) are due for refill today: yes  Requested medication (s) are on the active medication list: yes  Last refill: 02/27/20  Future visit scheduled: no  Notes to clinic:  no valid encounter within last 6 months; historical provider    Requested Prescriptions  Pending Prescriptions Disp Refills   amLODipine (NORVASC) 5 MG tablet [Pharmacy Med Name: AMLODIPINE BESYLATE 5MG  TABLETS] 90 tablet     Sig: TAKE 1 TABLET(5 MG) BY MOUTH DAILY      Cardiovascular:  Calcium Channel Blockers Failed - 05/24/2020  3:34 AM      Failed - Last BP in normal range    BP Readings from Last 1 Encounters:  03/23/20 (!) 152/76          Failed - Valid encounter within last 6 months    Recent Outpatient Visits           7 months ago Adjustment disorder with mixed anxiety and depressed mood   [facility], Lilia Argue, MD   8 months ago Adjustment disorder with mixed anxiety and depressed mood   [facility], Lilia Argue, MD   8 months ago Insomnia, unspecified type   [facility], Lilia Argue, MD   10 months ago Ischemic stroke [facility])   [facility], Lilia Argue, MD   11 months ago Ischemic stroke [facility])   Primary Care at [facility], Arlie Solomons, MD

## 2020-06-07 ENCOUNTER — Ambulatory Visit (INDEPENDENT_AMBULATORY_CARE_PROVIDER_SITE_OTHER): Payer: Medicare Other | Admitting: *Deleted

## 2020-06-07 DIAGNOSIS — I6300 Cerebral infarction due to thrombosis of unspecified precerebral artery: Secondary | ICD-10-CM

## 2020-06-07 LAB — CUP PACEART REMOTE DEVICE CHECK
Date Time Interrogation Session: 20210613231031
Implantable Pulse Generator Implant Date: 20200818

## 2020-06-09 NOTE — Progress Notes (Signed)
Carelink Summary Report / Loop Recorder 

## 2020-06-24 ENCOUNTER — Encounter: Payer: Self-pay | Admitting: Family Medicine

## 2020-06-24 ENCOUNTER — Other Ambulatory Visit: Payer: Self-pay

## 2020-06-24 ENCOUNTER — Telehealth: Payer: Self-pay | Admitting: Family Medicine

## 2020-06-24 ENCOUNTER — Telehealth (INDEPENDENT_AMBULATORY_CARE_PROVIDER_SITE_OTHER): Payer: Medicare Other | Admitting: Family Medicine

## 2020-06-24 DIAGNOSIS — Z7901 Long term (current) use of anticoagulants: Secondary | ICD-10-CM | POA: Diagnosis not present

## 2020-06-24 DIAGNOSIS — F5104 Psychophysiologic insomnia: Secondary | ICD-10-CM | POA: Diagnosis not present

## 2020-06-24 DIAGNOSIS — I10 Essential (primary) hypertension: Secondary | ICD-10-CM

## 2020-06-24 DIAGNOSIS — F4323 Adjustment disorder with mixed anxiety and depressed mood: Secondary | ICD-10-CM

## 2020-06-24 DIAGNOSIS — Z8673 Personal history of transient ischemic attack (TIA), and cerebral infarction without residual deficits: Secondary | ICD-10-CM

## 2020-06-24 DIAGNOSIS — E785 Hyperlipidemia, unspecified: Secondary | ICD-10-CM | POA: Diagnosis not present

## 2020-06-24 MED ORDER — AMLODIPINE BESYLATE 5 MG PO TABS: ORAL_TABLET | ORAL | 1 refills | Status: DC

## 2020-06-24 MED ORDER — ATORVASTATIN CALCIUM 40 MG PO TABS: 40.0000 mg | ORAL_TABLET | Freq: Every day | ORAL | 3 refills | Status: DC

## 2020-06-24 MED ORDER — ZOLPIDEM TARTRATE 5 MG PO TABS: 5.0000 mg | ORAL_TABLET | Freq: Every day | ORAL | 5 refills | Status: DC

## 2020-06-24 MED ORDER — SERTRALINE HCL 25 MG PO TABS: 25.0000 mg | ORAL_TABLET | Freq: Every day | ORAL | 5 refills | Status: DC

## 2020-06-24 NOTE — Progress Notes (Signed)
Virtual Visit Note  I connected with patient on 06/24/20 at 556pm by phone and verified that I am speaking with the correct person using two identifiers. Connie Meyer is currently located at home and patient is currently with them during visit. The provider, [name], MD is located in their office at time of visit.  I discussed the limitations, risks, security and privacy concerns of performing an evaluation and management service by telephone and the availability of in person appointments. I also discussed with the patient that there may be a patient responsible charge related to this service. The patient expressed understanding and agreed to proceed.   I provided 10 minutes of non-face-to-face time during this encounter.  Chief Complaint  Patient presents with  . Hypertension    pt needs refill amlodipine states she was taking at home BP but slowly stopped stated it had been okay  . Depression    started screening and pt said she had a legnthy conversation with a neuropshychiatrist that she would just like to speak with you about restarting antidepressant   . Hyperlipidemia    she has been working on her diet though not as well as she hopped, pt denies side effects  . Insomnia    pt has been doing well states the Lorrin Mais does great no side effects     HPI ? Saw neuropsych in may 2021 - deficits 2/2 anxiety and depression, patient would like to restart antidepressant, has done well on low dose sertraline in the past.   Saw neuro march 2021 - no changes, BP elevated at that OV She has not been checking bp at home She does take her amlodipine daily wo issues BP Readings from Last 3 Encounters:  03/23/20 (!) 152/76  11/24/19 140/84  10/16/19 140/85   She denies any abnormal bleeding or bruising from eliquis Lab Results  Component Value Date   WBC 4.3 08/11/2019   HGB 12.6 08/11/2019   HCT 37.0 08/11/2019   MCV 100.3 (H) 08/11/2019   PLT 170 08/11/2019  she  denies any focal weakness, speech changes, vision changes  Takes lipitor daily Back to working on exercise and diet  No Known Allergies  Prior to Admission medications   Medication Sig Start Date End Date Taking? Authorizing Provider  amLODipine (NORVASC) 5 MG tablet TAKE 1 TABLET(5 MG) BY MOUTH DAILY 05/25/20   [name], MD  apixaban (ELIQUIS) 5 MG TABS tablet Take 5 mg by mouth 2 (two) times daily.    [provider]  atorvastatin (LIPITOR) 40 MG tablet Take 1 tablet (40 mg total) by mouth daily at 6 PM. 08/27/19   Baldwin Jamaica, PA-C  bimatoprost (LATISSE) 0.03 % ophthalmic solution 1 application daily. 07/25/19   [provider]  fluticasone (FLONASE) 50 MCG/ACT nasal spray SHAKE LIQUID AND USE 1 SPRAY IN EACH NOSTRIL TWICE DAILY 01/26/20   [name], MD  zolpidem (AMBIEN) 5 MG tablet TAKE 1 TABLET(5 MG) BY MOUTH AT BEDTIME AS NEEDED FOR SLEEP 05/23/20   [name], MD    Past Medical History:  Diagnosis Date  . Adjustment disorder with mixed anxiety and depressed mood 09/15/2019  . CVA (cerebral vascular accident) 08/11/2019   small cortical and subcortical right parietal lobe infarcts of cryptogenic etiology  . Dyslipidemia 08/12/2019  . History of bilateral mastectomy   . 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 No further therapy. Currently in  remission.  [name] HTN (hypertension)   . Hypertension    Phreesia 06/21/2020  . Hypokalemia 06/14/2019  . Impingement syndrome of right shoulder 01/27/2020  . Insomnia 09/15/2019  . Stroke [facility])    Phreesia 06/21/2020    Past Surgical History:  Procedure Laterality Date  . ABDOMINAL HYSTERECTOMY N/A    Phreesia 06/21/2020  . BREAST SURGERY    . BUBBLE STUDY  06/16/2019   Procedure: BUBBLE STUDY;  Surgeon: Fay Records, MD;  Location: [facility] ENDOSCOPY;  Service: Cardiovascular;;  . LOOP RECORDER INSERTION N/A 06/16/2019   Procedure: LOOP RECORDER INSERTION;   Surgeon: Evans Lance, MD;  Location: [place] CV LAB;  Service: Cardiovascular;  Laterality: N/A;  . LOOP RECORDER INSERTION N/A 08/12/2019   Procedure: LOOP RECORDER INSERTION;  Surgeon: Thompson Grayer, MD;  Location: [place] CV LAB;  Service: Cardiovascular;  Laterality: N/A;  . TEE WITHOUT CARDIOVERSION N/A 06/16/2019   Procedure: TRANSESOPHAGEAL ECHOCARDIOGRAM (TEE);  Surgeon: Fay Records, MD;  Location: [facility] ENDOSCOPY;  Service: Cardiovascular;  Laterality: N/A;    Social History   Tobacco Use  . Smoking status: Never Smoker  . Smokeless tobacco: Never Used  Substance Use Topics  . Alcohol use: Yes    Alcohol/week: 1.0 standard drink    Types: 1 Glasses of wine per week    Family History  Problem Relation Age of Onset  . Hypertension Mother   . Hypertension Father     Review of Systems  Constitutional: Negative for chills and fever.  Respiratory: Negative for cough and shortness of breath.   Cardiovascular: Negative for chest pain, palpitations and leg swelling.  Gastrointestinal: Negative for abdominal pain, nausea and vomiting.    Objective  Vitals as reported by the patient: none   ASSESSMENT and PLAN  1. Essential hypertension Not at goal per las OV in march, discussed checking BP at home, call in with readings in 2 weeks  2. H/O: CVA (cerebrovascular accident) Stable. Cont secondary prevention.   3. Current use of long term anticoagulation - CBC; Future  4. Dyslipidemia Checking labs today, medications will be adjusted as needed. LDL goal < 70 - Comprehensive metabolic panel; Future - Lipid panel; Future  5. Adjustment disorder with mixed anxiety and depressed mood Not controlled, starting sertraline, reviewed r/se/b  6. Psychophysiological insomnia Stable. Cont current regime. pmp reviewed  Other orders - amLODipine (NORVASC) 5 MG tablet; TAKE 1 TABLET(5 MG) BY MOUTH DAILY - zolpidem (AMBIEN) 5 MG tablet; Take 1 tablet (5 mg total) by  mouth at bedtime. - atorvastatin (LIPITOR) 40 MG tablet; Take 1 tablet (40 mg total) by mouth daily at 6 PM. - sertraline (ZOLOFT) 25 MG tablet; Take 1 tablet (25 mg total) by mouth daily.  FOLLOW-UP: Return in about 4 weeks (around 07/22/2020) for fasting labs prior.   The above assessment and management plan was discussed with the patient. The patient verbalized understanding of and has agreed to the management plan. Patient is aware to call the clinic if symptoms persist or worsen. Patient is aware when to return to the clinic for a follow-up visit. Patient educated on when it is appropriate to go to the emergency department.     [name], MD Primary Care at [place], [place] [address] Ph.  [phone] Fax [phone]

## 2020-06-24 NOTE — Telephone Encounter (Signed)
Pt just missed a call from our office. No documentation . Maybe we called to triage for her Virtual appt for later today

## 2020-06-24 NOTE — Patient Instructions (Signed)
° ° ° °  If you have lab work done today you will be contacted with your lab results within the next 2 weeks.  If you have not heard from us then please contact us. The fastest way to get your results is to register for My Chart. ° ° °IF you received an x-ray today, you will receive an invoice from [facility]. Please contact [facility] at [phone] with questions or concerns regarding your invoice.  ° °IF you received labwork today, you will receive an invoice from [facility]. Please contact [facility] at [phone] with questions or concerns regarding your invoice.  ° °Our billing staff will not be able to assist you with questions regarding bills from these companies. ° °You will be contacted with the lab results as soon as they are available. The fastest way to get your results is to activate your My Chart account. Instructions are located on the last page of this paperwork. If you have not heard from us regarding the results in 2 weeks, please contact this office. °  ° ° ° °

## 2020-06-25 ENCOUNTER — Telehealth: Payer: Self-pay | Admitting: Family Medicine

## 2020-06-25 NOTE — Telephone Encounter (Addendum)
06/25/2020 - PATIENT HAD A VIRTUAL APPOINTMENT WITH DR. [name] ON Thursday (06/24/2020). DR. [name] HAS REQUESTED SHE RETURN IN 4 WEEKS FOR FOLLOW-UP WITH HER. SHE NEEDS TO MAKE A NURSE'S APPOINTMENT A FEW DAYS PRIOR. THE LAB ORDER IS IN. I TRIED TO CALL PATIENT TO SCHEDULE BUT HAD TO LEAVE HER A VOICE MAIL TO RETURN MY CALL. [place]

## 2020-07-12 ENCOUNTER — Ambulatory Visit (INDEPENDENT_AMBULATORY_CARE_PROVIDER_SITE_OTHER): Payer: Medicare Other | Admitting: *Deleted

## 2020-07-12 ENCOUNTER — Telehealth: Payer: Self-pay | Admitting: *Deleted

## 2020-07-12 DIAGNOSIS — I10 Essential (primary) hypertension: Secondary | ICD-10-CM

## 2020-07-12 LAB — CUP PACEART REMOTE DEVICE CHECK
Date Time Interrogation Session: 20210718231018
Implantable Pulse Generator Implant Date: 20200818

## 2020-07-12 NOTE — Telephone Encounter (Signed)
Schedule AWV.  

## 2020-07-13 NOTE — Progress Notes (Signed)
Carelink Summary Report / Loop Recorder 

## 2020-07-14 ENCOUNTER — Ambulatory Visit (INDEPENDENT_AMBULATORY_CARE_PROVIDER_SITE_OTHER): Payer: Medicare Other | Admitting: Emergency Medicine

## 2020-07-14 VITALS — BP 133/73 | Ht 66.0 in | Wt 137.0 lb

## 2020-07-14 DIAGNOSIS — Z0000 Encounter for general adult medical examination without abnormal findings: Secondary | ICD-10-CM | POA: Diagnosis not present

## 2020-07-14 NOTE — Progress Notes (Signed)
Presents today for [employer] Visit   Date of last exam: 06-24-2020  Interpreter used for this visit? No  I connected with  Connie Meyer on 07/14/20 by a telephone application and verified that I am speaking with the correct person using two identifiers.   I discussed the limitations of evaluation and management by telemedicine. The patient expressed understanding and agreed to proceed.    Patient Care Team: [name], MD as PCP - General (Family Medicine) [name], RN as [place] [facility]   Other items to address today  Discussed Eye/Dental Discussed Immunizations Follow up scheduled 8/2 @ 3:40    Other Screening: Last screening for diabetes: 08/12/2019 Last lipid screening: 08/12/2019  ADVANCE DIRECTIVES: Discussed: yes On File: no Materials Provided: no  Immunization status:  Immunization History  Administered Date(s) Administered  . Fluad Quad(high Dose 65+) 10/16/2019  . PFIZER SARS-COV-2 Vaccination 02/01/2020, 02/26/2020  . Pneumococcal Conjugate-13 02/17/2019     Health Maintenance Due  Topic Date Due  . Hepatitis C Screening  Never done  . TETANUS/TDAP  Never done  . DEXA SCAN  Never done  . PNA vac Low Risk Adult (2 of 2 - PPSV23) 02/18/2020     Functional Status Survey: Is the patient deaf or have difficulty hearing?: No Does the patient have difficulty seeing, even when wearing glasses/contacts?: No Does the patient have difficulty concentrating, remembering, or making decisions?: No Does the patient have difficulty walking or climbing stairs?: No Does the patient have difficulty dressing or bathing?: No Does the patient have difficulty doing errands alone such as visiting a doctor's office or shopping?: No   6CIT Screen 07/14/2020  What Year? 0 points  What month? 0 points  What time? 0 points  Count back from 20 0 points  Months in reverse 0 points  Repeat phrase 2 points    Total Score 2        Clinical Support from 07/14/2020 in [facility] at [place]  AUDIT-C Score 0       Home Environment:   Lives in a two story home Yes scattered rugs (grippers) No trouble climbing stairs No grab bars Adequate lighting/no clutter    Patient Active Problem List   Diagnosis Date Noted  . H/O: CVA (cerebrovascular accident) 06/24/2020  . Current use of long term anticoagulation 06/24/2020  . Impingement syndrome of right shoulder 01/27/2020  . Adjustment disorder with mixed anxiety and depressed mood 09/15/2019  . Insomnia 09/15/2019  . Dyslipidemia 08/12/2019  . CVA (cerebral vascular accident) 08/11/2019  . HTN (hypertension) 06/14/2019  . Hypokalemia 06/14/2019  . History of bilateral mastectomy   . Rib pain on left side 09/18/2016  . Hiatal hernia 09/05/2016  . Muscle contracture 05/20/2014  . Painful sexual intercourse 05/20/2014  . Scar condition and fibrosis of skin 05/20/2014  . History of breast cancer 2003     Past Medical History:  Diagnosis Date  . Adjustment disorder with mixed anxiety and depressed mood 09/15/2019  . CVA (cerebral vascular accident) 08/11/2019   small cortical and subcortical right parietal lobe infarcts of cryptogenic etiology  . Dyslipidemia 08/12/2019  . History of bilateral mastectomy   . 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 No further therapy. Currently in remission.  [name] HTN (hypertension)   . Hypertension    Phreesia 06/21/2020  . Hypokalemia 06/14/2019  . Impingement syndrome of right shoulder 01/27/2020  .  Insomnia 09/15/2019  . Stroke [facility])    Phreesia 06/21/2020     Past Surgical History:  Procedure Laterality Date  . ABDOMINAL HYSTERECTOMY N/A    Phreesia 06/21/2020  . BREAST SURGERY    . BUBBLE STUDY  06/16/2019   Procedure: BUBBLE STUDY;  Surgeon: Fay Records, MD;  Location: [facility] ENDOSCOPY;  Service: Cardiovascular;;  . LOOP RECORDER INSERTION N/A  06/16/2019   Procedure: LOOP RECORDER INSERTION;  Surgeon: Evans Lance, MD;  Location: [facility] CV LAB;  Service: Cardiovascular;  Laterality: N/A;  . LOOP RECORDER INSERTION N/A 08/12/2019   Procedure: LOOP RECORDER INSERTION;  Surgeon: Thompson Grayer, MD;  Location: [facility] CV LAB;  Service: Cardiovascular;  Laterality: N/A;  . TEE WITHOUT CARDIOVERSION N/A 06/16/2019   Procedure: TRANSESOPHAGEAL ECHOCARDIOGRAM (TEE);  Surgeon: Fay Records, MD;  Location: [facility] ENDOSCOPY;  Service: Cardiovascular;  Laterality: N/A;     Family History  Problem Relation Age of Onset  . Hypertension Mother   . Hypertension Father      Social History   Socioeconomic History  . Marital status: Divorced    Spouse name: Not on file  . Number of children: 4  . Years of education: 33  . Highest education level: Some college, no degree  Occupational History  . Occupation: Retired    Comment: Catering manager  Tobacco Use  . Smoking status: Never Smoker  . Smokeless tobacco: Never Used  Vaping Use  . Vaping Use: Never used  Substance and Sexual Activity  . Alcohol use: Yes    Alcohol/week: 1.0 standard drink    Types: 1 Glasses of wine per week  . Drug use: No  . Sexual activity: Not Currently  Other Topics Concern  . Not on file  Social History Narrative  . Not on file   Social Determinants of Health   Financial Resource Strain:   . Difficulty of Paying Living Expenses:   Food Insecurity:   . Worried About Charity fundraiser in the Last Year:   . Arboriculturist in the Last Year:   Transportation Needs:   . Film/video editor (Medical):   [name] Lack of Transportation (Non-Medical):   Physical Activity:   . Days of Exercise per Week:   . Minutes of Exercise per Session:   Stress:   . Feeling of Stress :   Social Connections:   . Frequency of Communication with Friends and Family:   . Frequency of Social Gatherings with Friends and Family:   . Attends Religious Services:   .  Active Member of Clubs or Organizations:   . Attends Archivist Meetings:   [name] Marital Status:   Intimate Partner Violence:   . Fear of Current or Ex-Partner:   . Emotionally Abused:   [name] Physically Abused:   . Sexually Abused:      No Known Allergies   Prior to Admission medications   Medication Sig Start Date End Date Taking? Authorizing Provider  amLODipine (NORVASC) 5 MG tablet TAKE 1 TABLET(5 MG) BY MOUTH DAILY 06/24/20  Yes [name], MD  apixaban (ELIQUIS) 5 MG TABS tablet Take 5 mg by mouth 2 (two) times daily.   Yes [provider]  atorvastatin (LIPITOR) 40 MG tablet Take 1 tablet (40 mg total) by mouth daily at 6 PM. 06/24/20  Yes Pamella Pert, Irma M, MD  bimatoprost (LATISSE) 0.03 % ophthalmic solution 1 application daily. 07/25/19  Yes [provider]  fluticasone Asencion Islam)  50 MCG/ACT nasal spray SHAKE LIQUID AND USE 1 SPRAY IN EACH NOSTRIL TWICE DAILY 01/26/20  Yes [name], MD  sertraline (ZOLOFT) 25 MG tablet Take 1 tablet (25 mg total) by mouth daily. 06/24/20  Yes [name], MD  zolpidem (AMBIEN) 5 MG tablet Take 1 tablet (5 mg total) by mouth at bedtime. 06/24/20  Yes [name], MD     Depression screen [facility] 2/9 07/14/2020 11/24/2019 10/16/2019 09/15/2019 09/05/2019  Decreased Interest 0 1 1 1 1   Down, Depressed, Hopeless 0 1 1 1 1   PHQ - 2 Score 0 2 2 2 2   Altered sleeping - 3 3 0 3  Tired, decreased energy - 1 2 3 3   Change in appetite - 1 0 0 2  Feeling bad or failure about yourself  - 1 0 0 0  Trouble concentrating - 1 0 1 2  Moving slowly or fidgety/restless - 0 0 0 1  Suicidal thoughts - 0 0 0 1  PHQ-9 Score - 9 7 6 14   Difficult doing work/chores - - Not difficult at all Somewhat difficult Not difficult at all     Fall Risk  07/14/2020 06/24/2020 03/23/2020 10/16/2019 09/15/2019  Falls in the past year? 0 0 0 0 0  Number falls in past yr: 0 - - 0 0  Injury with Fall? 0 - - 0 0  Risk Factor Category  - - - - -    Risk for fall due to : - - - - History of fall(s)  Follow up Falls evaluation completed;Education provided Falls evaluation completed - - -      PHYSICAL EXAM: BP 133/73 Comment: taken from patient at home  Ht 5\' 6"  (1.676 m)   Wt 137 lb (62.1 kg)   BMI 22.11 kg/m    Wt Readings from Last 3 Encounters:  07/14/20 137 lb (62.1 kg)  03/23/20 137 lb (62.1 kg)  01/27/20 138 lb (62.6 kg)       Education/Counseling provided regarding diet and exercise, prevention of chronic diseases, smoking/tobacco cessation, if applicable, and reviewed "Covered Medicare Preventive Services."

## 2020-07-14 NOTE — Patient Instructions (Addendum)
Thank you for taking time to come for your Medicare Wellness Visit. I appreciate your ongoing commitment to your health goals. Please review the following plan we discussed and let me know if I can assist you in the future.  Sanja Elizardo LPN  Preventive Care 65 Years and Older, Female Preventive care refers to lifestyle choices and visits with your health care provider that can promote health and wellness. This includes:  A yearly physical exam. This is also called an annual well check.  Regular dental and eye exams.  Immunizations.  Screening for certain conditions.  Healthy lifestyle choices, such as diet and exercise. What can I expect for my preventive care visit? Physical exam Your health care provider will check:  Height and weight. These may be used to calculate body mass index (BMI), which is a measurement that tells if you are at a healthy weight.  Heart rate and blood pressure.  Your skin for abnormal spots. Counseling Your health care provider may ask you questions about:  Alcohol, tobacco, and drug use.  Emotional well-being.  Home and relationship well-being.  Sexual activity.  Eating habits.  History of falls.  Memory and ability to understand (cognition).  Work and work environment.  Pregnancy and menstrual history. What immunizations do I need?  Influenza (flu) vaccine  This is recommended every year. Tetanus, diphtheria, and pertussis (Tdap) vaccine  You may need a Td booster every 10 years. Varicella (chickenpox) vaccine  You may need this vaccine if you have not already been vaccinated. Zoster (shingles) vaccine  You may need this after age 60. Pneumococcal conjugate (PCV13) vaccine  One dose is recommended after age 65. Pneumococcal polysaccharide (PPSV23) vaccine  One dose is recommended after age 65. Measles, mumps, and rubella (MMR) vaccine  You may need at least one dose of MMR if you were born in 1957 or later. You may also  need a second dose. Meningococcal conjugate (MenACWY) vaccine  You may need this if you have certain conditions. Hepatitis A vaccine  You may need this if you have certain conditions or if you travel or work in places where you may be exposed to hepatitis A. Hepatitis B vaccine  You may need this if you have certain conditions or if you travel or work in places where you may be exposed to hepatitis B. Haemophilus influenzae type b (Hib) vaccine  You may need this if you have certain conditions. You may receive vaccines as individual doses or as more than one vaccine together in one shot (combination vaccines). Talk with your health care provider about the risks and benefits of combination vaccines. What tests do I need? Blood tests  Lipid and cholesterol levels. These may be checked every 5 years, or more frequently depending on your overall health.  Hepatitis C test.  Hepatitis B test. Screening  Lung cancer screening. You may have this screening every year starting at age 55 if you have a 30-pack-year history of smoking and currently smoke or have quit within the past 15 years.  Colorectal cancer screening. All adults should have this screening starting at age 50 and continuing until age 75. Your health care provider may recommend screening at age 45 if you are at increased risk. You will have tests every 1-10 years, depending on your results and the type of screening test.  Diabetes screening. This is done by checking your blood sugar (glucose) after you have not eaten for a while (fasting). You may have this done every 1-3   years.  Mammogram. This may be done every 1-2 years. Talk with your health care provider about how often you should have regular mammograms.  BRCA-related cancer screening. This may be done if you have a family history of breast, ovarian, tubal, or peritoneal cancers. Other tests  Sexually transmitted disease (STD) testing.  Bone density scan. This is done  to screen for osteoporosis. You may have this done starting at age 94. Follow these instructions at home: Eating and drinking  Eat a diet that includes fresh fruits and vegetables, whole grains, lean protein, and low-fat dairy products. Limit your intake of foods with high amounts of sugar, saturated fats, and salt.  Take vitamin and mineral supplements as recommended by your health care provider.  Do not drink alcohol if your health care provider tells you not to drink.  If you drink alcohol: ? Limit how much you have to 0-1 drink a day. ? Be aware of how much alcohol is in your drink. In the [place] one drink equals one 12 oz bottle of beer (355 mL), one 5 oz glass of wine (148 mL), or one 1 oz glass of hard liquor (44 mL). Lifestyle  Take daily care of your teeth and gums.  Stay active. Exercise for at least 30 minutes on 5 or more days each week.  Do not use any products that contain nicotine or tobacco, such as cigarettes, e-cigarettes, and chewing tobacco. If you need help quitting, ask your health care provider.  If you are sexually active, practice safe sex. Use a condom or other form of protection in order to prevent STIs (sexually transmitted infections).  Talk with your health care provider about taking a low-dose aspirin or statin. What's next?  Go to your health care provider once a year for a well check visit.  Ask your health care provider how often you should have your eyes and teeth checked.  Stay up to date on all vaccines. This information is not intended to replace advice given to you by your health care provider. Make sure you discuss any questions you have with your health care provider. Document Revised: 12/05/2018 Document Reviewed: 12/05/2018 Elsevier Patient Education  2020 [employer].

## 2020-07-16 ENCOUNTER — Other Ambulatory Visit: Payer: Self-pay

## 2020-07-16 ENCOUNTER — Ambulatory Visit (INDEPENDENT_AMBULATORY_CARE_PROVIDER_SITE_OTHER): Payer: Medicare Other | Admitting: Family Medicine

## 2020-07-16 DIAGNOSIS — E785 Hyperlipidemia, unspecified: Secondary | ICD-10-CM | POA: Diagnosis not present

## 2020-07-16 DIAGNOSIS — Z7901 Long term (current) use of anticoagulants: Secondary | ICD-10-CM

## 2020-07-16 LAB — CBC
Hematocrit: 39.5 % (ref 34.0–46.6)
Hemoglobin: 12.9 g/dL (ref 11.1–15.9)
MCH: 32.3 pg (ref 26.6–33.0)
MCHC: 32.7 g/dL (ref 31.5–35.7)
MCV: 99 fL — ABNORMAL HIGH (ref 79–97)
Platelets: 190 10*3/uL (ref 150–450)
RBC: 3.99 x10E6/uL (ref 3.77–5.28)
RDW: 11.3 % — ABNORMAL LOW (ref 11.7–15.4)
WBC: 5.2 10*3/uL (ref 3.4–10.8)

## 2020-07-16 LAB — COMPREHENSIVE METABOLIC PANEL
ALT: 18 IU/L (ref 0–32)
AST: 20 IU/L (ref 0–40)
Albumin/Globulin Ratio: 2.1 (ref 1.2–2.2)
Albumin: 4.6 g/dL (ref 3.8–4.8)
Alkaline Phosphatase: 87 IU/L (ref 48–121)
BUN/Creatinine Ratio: 19 (ref 12–28)
BUN: 16 mg/dL (ref 8–27)
Bilirubin Total: 0.3 mg/dL (ref 0.0–1.2)
CO2: 20 mmol/L (ref 20–29)
Calcium: 9.1 mg/dL (ref 8.7–10.3)
Chloride: 106 mmol/L (ref 96–106)
Creatinine, Ser: 0.86 mg/dL (ref 0.57–1.00)
GFR calc Af Amer: 80 mL/min/{1.73_m2} (ref >59)
GFR calc non Af Amer: 69 mL/min/{1.73_m2} (ref >59)
Globulin, Total: 2.2 g/dL (ref 1.5–4.5)
Glucose: 102 mg/dL — ABNORMAL HIGH (ref 65–99)
Potassium: 3.5 mmol/L (ref 3.5–5.2)
Sodium: 139 mmol/L (ref 134–144)
Total Protein: 6.8 g/dL (ref 6.0–8.5)

## 2020-07-16 LAB — LIPID PANEL
Chol/HDL Ratio: 2 ratio (ref 0.0–4.4)
Cholesterol, Total: 123 mg/dL (ref 100–199)
HDL: 61 mg/dL (ref >39)
LDL Chol Calc (NIH): 52 mg/dL (ref 0–99)
Triglycerides: 36 mg/dL (ref 0–149)
VLDL Cholesterol Cal: 10 mg/dL (ref 5–40)

## 2020-07-26 ENCOUNTER — Telehealth: Payer: Self-pay | Admitting: Family Medicine

## 2020-07-26 ENCOUNTER — Ambulatory Visit: Payer: Medicare Other | Admitting: Adult Health

## 2020-07-26 ENCOUNTER — Ambulatory Visit: Payer: Medicare Other | Admitting: Family Medicine

## 2020-07-26 ENCOUNTER — Encounter: Payer: Self-pay | Admitting: Adult Health

## 2020-07-26 NOTE — Telephone Encounter (Signed)
Her appointment from 07/26/20-no show-she called on 07/25/20 and left a message with the after hours services i called earlier when i got the fax to make sure she wanted to cancel-she has been sick with a fever and cough since friday.

## 2020-07-26 NOTE — Progress Notes (Deleted)
[facility] [address] [place]. [place] [address] [phone]       OFFICE FOLLOW UP NOTE  [name] Date of Birth:  Mar 23, 1951 Medical Record Number:  [id number]   Reason for Referral: Cryptogenic stroke follow up    CHIEF COMPLAINT:  No chief complaint on file.   HPI:   Today, 07/26/2020, [name] returns for stroke follow up.   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 Melvyn Meyer for neuropsych eval  which were largely within normal limits although across mood related questionnaires, reported severe symptoms of both anxiety and depression along with moderate sleep dysfunction which is likely contributing to subjective cognitive complaints.  She has since been restarted on sertraline 25 mg daily by PCP approximately 1 month ago.  Also continues Ambien for chronic insomnia.  Continues on Eliquis and atorvastatin without side effects.  Blood pressure today ***.  Loop recorder has not shown atrial fibrillation thus far.    History provided for reference purposes only Update 03/23/2020 JM, [name] is here for follow-up regarding cryptogenic stroke.  She continues to be stable from a stroke standpoint without new or worsening stroke/TIA symptoms.  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  She continues on apixaban 5 mg twice daily without bleeding or bruising.  Continues on atorvastatin without myalgias.  Blood pressure today 152/76.  loop recorder is not shown atrial fibrillation thus far.  No further concerns at this time.  Update 11/24/2019 JM: [name] is a 69 year old female who is being seen today for stroke follow-up.  Since prior visit, on 08/11/2019, she presented to ED with 2 transient episodes of confusion along with apraxia.  MRI showed small cortical and  subcortical right parietal lobe infarcts new from prior imaging with cryptogenic etiology.  Loop recorder planned on being reimplanted as it was inadvertently expelled but had not done so prior to new strokes.  Loop recorder replaced during admission.  She was recently enrolled in the [place] trial but unfortunately met study endpoint due to new strokes.  Due to recent qualification with Remi Haggard trial and 2 embolic strokes, recommended initiating Eliquis.  HTN stable.  LDL 67 and continued on atorvastatin 40 mg daily.  Prior history of breast cancer in remission for 20 years and recommended outpatient follow-up with Dr. Pamella Meyer outpatient for cancer recurrence screening with questionable hypercoagulable state with recurrent strokes.  Discharged home in stable condition. Since discharge, she has been doing well from a stroke standpoint without residual deficits or reoccurring of symptoms.  She was started on sertraline by PCP with improvement of depression but reports today that she self discontinued sertraline approximately 2 weeks ago due to feeling of flat affect and lack of emotion.  Since discontinuing, she has experienced slight worsening of depression/anxiety.  She was referred to therapy by PCP with evaluation scheduled but canceled visit as she felt depression stable.  She is interested at this time of pursuing therapy and plans on calling to schedule evaluation.  She also reports greater difficulty with short-term memory and delayed processing/recall.  She is also been having difficulty with insomnia currently on Ambien 5 mg nightly with mild benefit.  She also feels increased depression/anxiety related to current COVID-19 concerns and restrictions.  She also previously exercise frequently but has been fearful of exercising as she had reoccurring stroke after going for  a run.  She has continued on apixaban 5 mg twice daily without bleeding or bruising.  Continues on atorvastatin without myalgias.  Blood  pressure today 140/84.  Loop recorder showed 10 new false AF episodes without true AF reported.  No further concerns at this time.  Hospital admission 06/13/2019: [name] is a 69 y.o. female with no significant past medical hx other than previous breast cancer presenting with transient visual changes in the setting of recently diagnosed hypertension. She did not receive IV t-PA due to late presentation (>4.5 hours from time of onset).  Stroke work-up revealed punctate acute/early subacute infarctions within the left occipital lobe embolic pattern secondary to unknown source with residual superior homonymous quadrantanopia.  MRI showed punctate acute/early subacute infarctions within the left occipital lobe.  TCD and carotid Dopplers unremarkable.  2D echo unremarkable without cardiac source is identified.  TEE no cardiac source of embolus or PFO therefore loop recorder placed to assess for atrial fibrillation.  LDL 107 A1c 5.4.  Recommended DAPT for 3 weeks and aspirin alone.  Initiated atorvastatin 80 mg daily for HTN management.  HTN stable.  Other stroke risk factors include advanced age and EtOH use but no prior history of stroke.  Discharged home in stable condition without residual deficits.  Initial visit 07/23/2019: No residual deficits - she does question if vision slightly worse such as film over eyes -she plans on scheduling visit with ophthalmology Completed 3 weeks DAPT with recommendation of continuing aspirin alone but discontinued both aspirin and Plavix as she did not have additional refills Continues on atorvastatin without myalgias Blood pressure 150/90 - at home typically 130s Loop recorder apparently fell out shortly after implanted.  Cardiology aware and plans on replacing soon. No further concerns at this time.     ROS:   14 system review of systems performed and negative with exception of depression/anxiety, insomnia and memory loss  PMH:  Past Medical History:   Diagnosis Date  . Adjustment disorder with mixed anxiety and depressed mood 09/15/2019  . CVA (cerebral vascular accident) 08/11/2019   small cortical and subcortical right parietal lobe infarcts of cryptogenic etiology  . Dyslipidemia 08/12/2019  . History of bilateral mastectomy   . 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 No further therapy. Currently in remission.  [name] HTN (hypertension)   . Hypertension    Phreesia 06/21/2020  . Hypokalemia 06/14/2019  . Impingement syndrome of right shoulder 01/27/2020  . Insomnia 09/15/2019  . Stroke [facility])    Phreesia 06/21/2020    PSH:  Past Surgical History:  Procedure Laterality Date  . ABDOMINAL HYSTERECTOMY N/A    Phreesia 06/21/2020  . BREAST SURGERY    . BUBBLE STUDY  06/16/2019   Procedure: BUBBLE STUDY;  Surgeon: Fay Records, MD;  Location: [facility] ENDOSCOPY;  Service: Cardiovascular;;  . LOOP RECORDER INSERTION N/A 06/16/2019   Procedure: LOOP RECORDER INSERTION;  Surgeon: Evans Lance, MD;  Location: [facility] CV LAB;  Service: Cardiovascular;  Laterality: N/A;  . LOOP RECORDER INSERTION N/A 08/12/2019   Procedure: LOOP RECORDER INSERTION;  Surgeon: Thompson Grayer, MD;  Location: [facility] LAB;  Service: Cardiovascular;  Laterality: N/A;  . TEE WITHOUT CARDIOVERSION N/A 06/16/2019   Procedure: TRANSESOPHAGEAL ECHOCARDIOGRAM (TEE);  Surgeon: Fay Records, MD;  Location: [facility] ENDOSCOPY;  Service: Cardiovascular;  Laterality: N/A;    Social History:  Social History   Socioeconomic History  . Marital status:  Divorced    Spouse name: Not on file  . Number of children: 4  . Years of education: 28  . Highest education level: Some college, no degree  Occupational History  . Occupation: Retired    Comment: Catering manager  Tobacco Use  . Smoking status: Never Smoker  . Smokeless tobacco: Never Used  Vaping Use  . Vaping Use: Never used  Substance and Sexual Activity  . Alcohol use:  Yes    Alcohol/week: 1.0 standard drink    Types: 1 Glasses of wine per week  . Drug use: No  . Sexual activity: Not Currently  Other Topics Concern  . Not on file  Social History Narrative  . Not on file   Social Determinants of Health   Financial Resource Strain:   . Difficulty of Paying Living Expenses:   Food Insecurity:   . Worried About Charity fundraiser in the Last Year:   . Arboriculturist in the Last Year:   Transportation Needs:   . Film/video editor (Medical):   [name] Lack of Transportation (Non-Medical):   Physical Activity:   . Days of Exercise per Week:   . Minutes of Exercise per Session:   Stress:   . Feeling of Stress :   Social Connections:   . Frequency of Communication with Friends and Family:   . Frequency of Social Gatherings with Friends and Family:   . Attends Religious Services:   . Active Member of Clubs or Organizations:   . Attends Archivist Meetings:   [name] Marital Status:   Intimate Partner Violence:   . Fear of Current or Ex-Partner:   . Emotionally Abused:   [name] Physically Abused:   . Sexually Abused:     Family History:  Family History  Problem Relation Age of Onset  . Hypertension Mother   . Hypertension Father     Medications:   Current Outpatient Medications on File Prior to Visit  Medication Sig Dispense Refill  . amLODipine (NORVASC) 5 MG tablet TAKE 1 TABLET(5 MG) BY MOUTH DAILY 90 tablet 1  . apixaban (ELIQUIS) 5 MG TABS tablet Take 5 mg by mouth 2 (two) times daily.    [name] atorvastatin (LIPITOR) 40 MG tablet Take 1 tablet (40 mg total) by mouth daily at 6 PM. 90 tablet 3  . bimatoprost (LATISSE) 0.03 % ophthalmic solution 1 application daily.    . fluticasone (FLONASE) 50 MCG/ACT nasal spray SHAKE LIQUID AND USE 1 SPRAY IN EACH NOSTRIL TWICE DAILY 16 g 6  . sertraline (ZOLOFT) 25 MG tablet Take 1 tablet (25 mg total) by mouth daily. 30 tablet 5  . zolpidem (AMBIEN) 5 MG tablet Take 1 tablet (5 mg total) by mouth at  bedtime. 30 tablet 5   No current facility-administered medications on file prior to visit.    Allergies:  No Known Allergies   Physical Exam  There were no vitals filed for this visit. There is no height or weight on file to calculate BMI. No exam data present   General: well developed, well nourished,  pleasant middle-age Caucasian female, seated, tearful during visit Head: head normocephalic and atraumatic.   Neck: supple with no carotid or supraclavicular bruits Cardiovascular: regular rate and rhythm, no murmurs Musculoskeletal: no deformity Skin:  no rash/petichiae Vascular:  Normal pulses all extremities   Neurologic Exam Mental Status: Awake and fully alert.   Normal speech and language.  Oriented to place and time. Recent and remote  memory slightly diminished. Attention span, concentration and fund of knowledge appropriate during visit.  Tearful with increased anxiety during visit Cranial Nerves: Pupils equal, briskly reactive to light. Extraocular movements full without nystagmus. Visual fields full to confrontation. Hearing intact. Facial sensation intact. Face, tongue, palate moves normally and symmetrically.  Motor: Normal bulk and tone. Normal strength in all tested extremity muscles. Sensory.: intact to touch , pinprick , position and vibratory sensation.  Coordination: Rapid alternating movements normal in all extremities. Finger-to-nose and heel-to-shin performed accurately bilaterally. Gait and Station: Arises from chair without difficulty. Stance is normal. Gait demonstrates normal stride length and balance Reflexes: 1+ and symmetric. Toes downgoing.       ASSESSMENT: Connie Meyer is a 69 y.o. year old female here with reoccurring cryptogenic strokes recently 08/11/2019 small cortical and subcortical infarcts in the right parietal lobe and punctate acute/early subacute infarctions within the left occipital lobe embolic pattern on 08/05/7516.  Loop recorder  inadvertently expelled and had plans on following up with cardiology for replacement but not completed prior to August admission.  She was enrolled in [place] trial but due to reoccurring stroke, study endpoint met due to reoccurring stroke.  Initiated Eliquis due to reoccurring likely cardioembolic etiology stroke.  Loop recorder replaced during admission which has not shown atrial fibrillation thus far.  Vascular risk factors include HTN, HLD and prior EtOH use.    PLAN:  1. Cryptogenic stroke: -Residual deficits: Post stroke depression and cognitive complaints -Continue Eliquis (apixaban) daily  and continue atorvastatin 80 mg daily for secondary stroke prevention.  Qualified for Eliquis due to meeting criteria for Remi Haggard trial and reoccurring cortical strokes with undetermined etiology.   -Continue to monitor loop recorder for possible atrial fibrillation -Ensure close PCP follow-up for aggressive stroke risk factor management 2. HTN: BP goal <130/90.  Stable.  Managed by PCP 3. HLD: LDL goal <70.  Ongoing use of atorvastatin.  Managed by PCP including monitoring of lipid panel and prescribing statin 4. Adjustment/reactive depression/anxiety, post stroke -Continues to decline interested in antidepressants as previously on sertraline and self discontinued due to side effects -Previously declined interest in psychology/psychiatry but is interested in evaluation by neuropsychology for ongoing difficulty adjusting post stroke as well as evaluation of short-term memory concerns questionable stroke related vs depression/anxiety related   Follow up in 4 months or call earlier if needed   I spent 26 minutes of face-to-face and non-face-to-face time with patient.  This included previsit chart review, lab review, study review, order entry, electronic health record documentation, patient education regarding prior cryptogenic strokes and ongoing difficulty with depression/anxiety   Frann Rider,  AGNP-BC  [facility] [address] [place], [place] [address]  Phone [phone] Fax [phone] Note: This document was prepared with digital dictation and possible smart phrase technology. Any transcriptional errors that result from this process are unintentional.

## 2020-08-03 ENCOUNTER — Encounter: Payer: Self-pay | Admitting: Family Medicine

## 2020-08-03 ENCOUNTER — Telehealth: Payer: Self-pay | Admitting: *Deleted

## 2020-08-03 ENCOUNTER — Telehealth (INDEPENDENT_AMBULATORY_CARE_PROVIDER_SITE_OTHER): Payer: Medicare Other | Admitting: Family Medicine

## 2020-08-03 ENCOUNTER — Other Ambulatory Visit: Payer: Self-pay

## 2020-08-03 DIAGNOSIS — R05 Cough: Secondary | ICD-10-CM

## 2020-08-03 DIAGNOSIS — E785 Hyperlipidemia, unspecified: Secondary | ICD-10-CM

## 2020-08-03 DIAGNOSIS — R058 Other specified cough: Secondary | ICD-10-CM

## 2020-08-03 DIAGNOSIS — I10 Essential (primary) hypertension: Secondary | ICD-10-CM

## 2020-08-03 NOTE — Patient Instructions (Signed)
° ° ° °  If you have lab work done today you will be contacted with your lab results within the next 2 weeks.  If you have not heard from us then please contact us. The fastest way to get your results is to register for My Chart. ° ° °IF you received an x-ray today, you will receive an invoice from [facility]. Please contact [facility] at [phone] with questions or concerns regarding your invoice.  ° °IF you received labwork today, you will receive an invoice from [facility]. Please contact [facility] at [phone] with questions or concerns regarding your invoice.  ° °Our billing staff will not be able to assist you with questions regarding bills from these companies. ° °You will be contacted with the lab results as soon as they are available. The fastest way to get your results is to activate your My Chart account. Instructions are located on the last page of this paperwork. If you have not heard from us regarding the results in 2 weeks, please contact this office. °  ° ° ° °

## 2020-08-03 NOTE — Telephone Encounter (Signed)
Left message mobile/home voice mail to call back to be triaged for video appt at 4:40 pm.

## 2020-08-03 NOTE — Progress Notes (Signed)
Virtual Visit Note  I connected with patient on 08/03/20 at 445pm by video epic and verified that I am speaking with the correct person using two identifiers. Connie Meyer is currently located at home and patient is currently with them during visit. The provider, [name], MD is located in their office at time of visit.  I discussed the limitations, risks, security and privacy concerns of performing an evaluation and management service by telephone and the availability of in person appointments. I also discussed with the patient that there may be a patient responsible charge related to this service. The patient expressed understanding and agreed to proceed.   I provided 12 minutes of non-face-to-face time during this encounter.  Chief Complaint  Patient presents with  . Cough    pt has had cough and headache, grandson diagnosed with RSV last week pt took care of him and now has same simptoms, pt reports productive cough, it is getting better     HPI ? She has been sick for the past several weeks She has been getting worse She just found out her grandson who she was caring for had RSV She cared for grandson 3-4 days prior Started with sore throat, headaches, productive cough, wheezing, sneezing, ear aches, runny nose She checked temp once 100.0, no chills No body aches or rashes No SOB No loss of sense of taste or smell She continues to be tired, have headaches and cough She has had covid vaccines  She has been checking BP at home Highest 148/78, the rest 128/72, 131/73, 133/73, 134/71 Today 140/77  Labs from July reviewed  Lab Results  Component Value Date   CHOL 123 07/16/2020   HDL 61 07/16/2020   LDLCALC 52 07/16/2020   TRIG 36 07/16/2020   CHOLHDL 2.0 07/16/2020    No Known Allergies  Prior to Admission medications   Medication Sig Start Date End Date Taking? Authorizing Provider  amLODipine (NORVASC) 5 MG tablet TAKE 1 TABLET(5 MG) BY MOUTH DAILY  06/24/20  Yes [name], MD  apixaban (ELIQUIS) 5 MG TABS tablet Take 5 mg by mouth 2 (two) times daily.   Yes [provider]  atorvastatin (LIPITOR) 40 MG tablet Take 1 tablet (40 mg total) by mouth daily at 6 PM. 06/24/20  Yes Pamella Pert, Domenique Southers M, MD  bimatoprost (LATISSE) 0.03 % ophthalmic solution 1 application daily. 07/25/19  Yes [provider]  fluticasone (FLONASE) 50 MCG/ACT nasal spray SHAKE LIQUID AND USE 1 SPRAY IN EACH NOSTRIL TWICE DAILY 01/26/20  Yes [name], MD  sertraline (ZOLOFT) 25 MG tablet Take 1 tablet (25 mg total) by mouth daily. 06/24/20  Yes [name], MD  zolpidem (AMBIEN) 5 MG tablet Take 1 tablet (5 mg total) by mouth at bedtime. 06/24/20  Yes [name], MD    Past Medical History:  Diagnosis Date  . Adjustment disorder with mixed anxiety and depressed mood 09/15/2019  . CVA (cerebral vascular accident) 08/11/2019   small cortical and subcortical right parietal lobe infarcts of cryptogenic etiology  . Dyslipidemia 08/12/2019  . History of bilateral mastectomy   . 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 No further therapy. Currently in remission.  [name] HTN (hypertension)   . Hypertension    Phreesia 06/21/2020  . Hypokalemia 06/14/2019  . Impingement syndrome of right shoulder 01/27/2020  . Insomnia 09/15/2019  . Stroke [facility])    Phreesia 06/21/2020  Past Surgical History:  Procedure Laterality Date  . ABDOMINAL HYSTERECTOMY N/A    Phreesia 06/21/2020  . BREAST SURGERY    . BUBBLE STUDY  06/16/2019   Procedure: BUBBLE STUDY;  Surgeon: Fay Records, MD;  Location: [facility] ENDOSCOPY;  Service: Cardiovascular;;  . LOOP RECORDER INSERTION N/A 06/16/2019   Procedure: LOOP RECORDER INSERTION;  Surgeon: Evans Lance, MD;  Location: [place] CV LAB;  Service: Cardiovascular;  Laterality: N/A;  . LOOP RECORDER INSERTION N/A 08/12/2019   Procedure: LOOP RECORDER INSERTION;  Surgeon: Thompson Grayer, MD;  Location: [place] LAB;  Service: Cardiovascular;  Laterality: N/A;  . TEE WITHOUT CARDIOVERSION N/A 06/16/2019   Procedure: TRANSESOPHAGEAL ECHOCARDIOGRAM (TEE);  Surgeon: Fay Records, MD;  Location: [facility] ENDOSCOPY;  Service: Cardiovascular;  Laterality: N/A;    Social History   Tobacco Use  . Smoking status: Never Smoker  . Smokeless tobacco: Never Used  Substance Use Topics  . Alcohol use: Yes    Alcohol/week: 1.0 standard drink    Types: 1 Glasses of wine per week    Family History  Problem Relation Age of Onset  . Hypertension Mother   . Hypertension Father     ROS Per hpi  Objective  Vitals as reported by the patient: as above  GEN: AAOx3, NAD HEENT: [place]/AT, pupils are symmetrical, EOMI, non-icteric sclera Resp: breathing comfortably, speaking in full sentences Skin: no rashes noted, no pallor Psych: good eye contact, normal mood and affect   ASSESSMENT and PLAN  1. Post-viral cough syndrome Clinically stable. Given duration of fatigue suspect breakthrough covid infection. Outside of quarantine window. Continue with supportive measures, RTC precautions given  2. Essential hypertension Controlled. Continue current regime.   3. Dyslipidemia Controlled. Continue current regime.   FOLLOW-UP: 6 months   The above assessment and management plan was discussed with the patient. The patient verbalized understanding of and has agreed to the management plan. Patient is aware to call the clinic if symptoms persist or worsen. Patient is aware when to return to the clinic for a follow-up visit. Patient educated on when it is appropriate to go to the emergency department.     [name], MD Primary Care at [place], [place] [address] Ph.  [phone] Fax [phone]

## 2020-08-15 ENCOUNTER — Other Ambulatory Visit: Payer: Self-pay | Admitting: Family Medicine

## 2020-08-15 NOTE — Telephone Encounter (Signed)
Requested Prescriptions  Pending Prescriptions Disp Refills  . fluticasone (FLONASE) 50 MCG/ACT nasal spray [Pharmacy Med Name: FLUTICASONE 50MCG NASAL SP (120) RX] 16 g 6    Sig: SHAKE LIQUID AND USE 1 SPRAY IN EACH NOSTRIL TWICE DAILY     Ear, Nose, and Throat: Nasal Preparations - Corticosteroids Passed - 08/15/2020  3:33 AM      Passed - Valid encounter within last 12 months    Recent Outpatient Visits          1 week ago Post-viral cough syndrome   [facility], Lilia Argue, MD   1 month ago Current use of long term anticoagulation   [facility], Lilia Argue, MD   1 month ago Medicare annual wellness visit, subsequent   [facility] at [facility], Ines Bloomer, MD   1 month ago Essential hypertension   [facility], Lilia Argue, MD   10 months ago Adjustment disorder with mixed anxiety and depressed mood   [facility], Lilia Argue, MD

## 2020-08-16 ENCOUNTER — Ambulatory Visit (INDEPENDENT_AMBULATORY_CARE_PROVIDER_SITE_OTHER): Payer: Medicare Other | Admitting: *Deleted

## 2020-08-16 DIAGNOSIS — I6300 Cerebral infarction due to thrombosis of unspecified precerebral artery: Secondary | ICD-10-CM

## 2020-08-16 LAB — CUP PACEART REMOTE DEVICE CHECK
Date Time Interrogation Session: 20210820230027
Implantable Pulse Generator Implant Date: 20200818

## 2020-08-23 NOTE — Progress Notes (Signed)
Carelink Summary Report / Loop Recorder 

## 2020-08-28 ENCOUNTER — Other Ambulatory Visit: Payer: Self-pay | Admitting: Physician Assistant

## 2020-09-16 ENCOUNTER — Other Ambulatory Visit: Payer: Self-pay | Admitting: Family Medicine

## 2020-09-16 NOTE — Telephone Encounter (Signed)
Requested Prescriptions  Pending Prescriptions Disp Refills  . sertraline (ZOLOFT) 25 MG tablet [Pharmacy Med Name: SERTRALINE 25MG  TABLETS] 90 tablet 0    Sig: TAKE 1 TABLET(25 MG) BY MOUTH DAILY     Psychiatry:  Antidepressants - SSRI Passed - 09/16/2020  8:00 AM      Passed - Valid encounter within last 6 months    Recent Outpatient Visits          1 month ago Post-viral cough syndrome   [facility], Lilia Argue, MD   2 months ago Current use of long term anticoagulation   [facility], Lilia Argue, MD   2 months ago Medicare annual wellness visit, subsequent   [facility] at [facility], Ines Bloomer, MD   2 months ago Essential hypertension   [facility], Lilia Argue, MD   11 months ago Adjustment disorder with mixed anxiety and depressed mood   [facility], Lilia Argue, MD

## 2020-09-19 LAB — CUP PACEART REMOTE DEVICE CHECK
Date Time Interrogation Session: 20210922230450
Implantable Pulse Generator Implant Date: 20200818

## 2020-09-20 ENCOUNTER — Ambulatory Visit (INDEPENDENT_AMBULATORY_CARE_PROVIDER_SITE_OTHER): Payer: Medicare Other | Admitting: Emergency Medicine

## 2020-09-20 DIAGNOSIS — I6300 Cerebral infarction due to thrombosis of unspecified precerebral artery: Secondary | ICD-10-CM

## 2020-09-23 NOTE — Progress Notes (Signed)
Carelink Summary Report / Loop Recorder 

## 2020-10-23 LAB — CUP PACEART REMOTE DEVICE CHECK
Date Time Interrogation Session: 20211025230502
Implantable Pulse Generator Implant Date: 20200818

## 2020-10-25 ENCOUNTER — Ambulatory Visit (INDEPENDENT_AMBULATORY_CARE_PROVIDER_SITE_OTHER): Payer: Medicare Other

## 2020-10-25 DIAGNOSIS — I6300 Cerebral infarction due to thrombosis of unspecified precerebral artery: Secondary | ICD-10-CM | POA: Diagnosis not present

## 2020-10-28 NOTE — Progress Notes (Signed)
Carelink Summary Report / Loop Recorder 

## 2020-11-12 ENCOUNTER — Other Ambulatory Visit: Payer: Self-pay | Admitting: Family Medicine

## 2020-11-12 MED ORDER — APIXABAN 5 MG PO TABS
5.0000 mg | ORAL_TABLET | Freq: Two times a day (BID) | ORAL | 0 refills | Status: DC
Start: 2020-11-12 — End: 2020-12-10

## 2020-11-12 NOTE — Telephone Encounter (Signed)
Pt needs refill for apixaban (ELIQUIS) 5 MG TABS tablet Asap/ pt stated she has had 3 strokes and cant go any longer without this med / please advise and send to [id number] [place] [facility] [place]  [address] [place], [place] [address]  Phone:  [phone] Fax:  [phone]

## 2020-11-15 ENCOUNTER — Other Ambulatory Visit: Payer: Self-pay

## 2020-11-15 ENCOUNTER — Encounter: Payer: Self-pay | Admitting: Family Medicine

## 2020-11-15 ENCOUNTER — Ambulatory Visit (INDEPENDENT_AMBULATORY_CARE_PROVIDER_SITE_OTHER): Payer: Medicare Other | Admitting: Family Medicine

## 2020-11-15 ENCOUNTER — Telehealth: Payer: Self-pay | Admitting: Family Medicine

## 2020-11-15 ENCOUNTER — Telehealth: Payer: Self-pay | Admitting: *Deleted

## 2020-11-15 VITALS — BP 160/89 | HR 97 | Temp 97.2°F | Ht 66.0 in | Wt 130.0 lb

## 2020-11-15 DIAGNOSIS — Z23 Encounter for immunization: Secondary | ICD-10-CM

## 2020-11-15 DIAGNOSIS — F5104 Psychophysiologic insomnia: Secondary | ICD-10-CM

## 2020-11-15 DIAGNOSIS — I10 Essential (primary) hypertension: Secondary | ICD-10-CM

## 2020-11-15 MED ORDER — AMLODIPINE BESYLATE 10 MG PO TABS: ORAL_TABLET | ORAL | 3 refills | Status: DC

## 2020-11-15 MED ORDER — ZOLPIDEM TARTRATE 5 MG PO TABS: 5.0000 mg | ORAL_TABLET | Freq: Every day | ORAL | 5 refills | Status: DC

## 2020-11-15 NOTE — Telephone Encounter (Signed)
Initiated  PA over the phone   Case number [id number]   72 hours

## 2020-11-15 NOTE — Telephone Encounter (Signed)
Shell get refill at appt.

## 2020-11-15 NOTE — Patient Instructions (Addendum)
Take 2 amlodipine tabs per day for a total of 10mg , refills sent for 10mg  tabs Continue to take BP daily  Refills sent for Zolpidem, let me know if you have any issues picking this up   Health Maintenance After Age 70 After age 79, you are at a higher risk for certain long-term diseases and infections as well as injuries from falls. Falls are a major cause of broken bones and head injuries in people who are older than age 48. Getting regular preventive care can help to keep you healthy and well. Preventive care includes getting regular testing and making lifestyle changes as recommended by your health care provider. Talk with your health care provider about:  Which screenings and tests you should have. A screening is a test that checks for a disease when you have no symptoms.  A diet and exercise plan that is right for you. What should I know about screenings and tests to prevent falls? Screening and testing are the best ways to find a health problem early. Early diagnosis and treatment give you the best chance of managing medical conditions that are common after age 62. Certain conditions and lifestyle choices may make you more likely to have a fall. Your health care provider may recommend:  Regular vision checks. Poor vision and conditions such as cataracts can make you more likely to have a fall. If you wear glasses, make sure to get your prescription updated if your vision changes.  Medicine review. Work with your health care provider to regularly review all of the medicines you are taking, including over-the-counter medicines. Ask your health care provider about any side effects that may make you more likely to have a fall. Tell your health care provider if any medicines that you take make you feel dizzy or sleepy.  Osteoporosis screening. Osteoporosis is a condition that causes the bones to get weaker. This can make the bones weak and cause them to break more easily.  Blood pressure  screening. Blood pressure changes and medicines to control blood pressure can make you feel dizzy.  Strength and balance checks. Your health care provider may recommend certain tests to check your strength and balance while standing, walking, or changing positions.  Foot health exam. Foot pain and numbness, as well as not wearing proper footwear, can make you more likely to have a fall.  Depression screening. You may be more likely to have a fall if you have a fear of falling, feel emotionally low, or feel unable to do activities that you used to do.  Alcohol use screening. Using too much alcohol can affect your balance and may make you more likely to have a fall. What actions can I take to lower my risk of falls? General instructions  Talk with your health care provider about your risks for falling. Tell your health care provider if: ? You fall. Be sure to tell your health care provider about all falls, even ones that seem minor. ? You feel dizzy, sleepy, or off-balance.  Take over-the-counter and prescription medicines only as told by your health care provider. These include any supplements.  Eat a healthy diet and maintain a healthy weight. A healthy diet includes low-fat dairy products, low-fat (lean) meats, and fiber from whole grains, beans, and lots of fruits and vegetables. Home safety  Remove any tripping hazards, such as rugs, cords, and clutter.  Install safety equipment such as grab bars in bathrooms and safety rails on stairs.  Keep rooms and walkways  well-lit. Activity   Follow a regular exercise program to stay fit. This will help you maintain your balance. Ask your health care provider what types of exercise are appropriate for you.  If you need a cane or walker, use it as recommended by your health care provider.  Wear supportive shoes that have nonskid soles. Lifestyle  Do not drink alcohol if your health care provider tells you not to drink.  If you drink  alcohol, limit how much you have: ? 0-1 drink a day for women. ? 0-2 drinks a day for men.  Be aware of how much alcohol is in your drink. In the [place] one drink equals one typical bottle of beer (12 oz), one-half glass of wine (5 oz), or one shot of hard liquor (1 oz).  Do not use any products that contain nicotine or tobacco, such as cigarettes and e-cigarettes. If you need help quitting, ask your health care provider. Summary  Having a healthy lifestyle and getting preventive care can help to protect your health and wellness after age 29.  Screening and testing are the best way to find a health problem early and help you avoid having a fall. Early diagnosis and treatment give you the best chance for managing medical conditions that are more common for people who are older than age 69.  Falls are a major cause of broken bones and head injuries in people who are older than age 93. Take precautions to prevent a fall at home.  Work with your health care provider to learn what changes you can make to improve your health and wellness and to prevent falls. This information is not intended to replace advice given to you by your health care provider. Make sure you discuss any questions you have with your health care provider. Document Revised: 04/03/2019 Document Reviewed: 10/24/2017 Elsevier Patient Education  [employer].    If you have lab work done today you will be contacted with your lab results within the next 2 weeks.  If you have not heard from [place] then please contact us. The fastest way to get your results is to register for My Chart.   IF you received an x-ray today, you will receive an invoice from [facility]. Please contact [facility] at [phone] with questions or concerns regarding your invoice.   IF you received labwork today, you will receive an invoice from [facility]. Please contact [facility] at [phone] with questions or concerns regarding your  invoice.   Our billing staff will not be able to assist you with questions regarding bills from these companies.  You will be contacted with the lab results as soon as they are available. The fastest way to get your results is to activate your My Chart account. Instructions are located on the last page of this paperwork. If you have not heard from [place] regarding the results in 2 weeks, please contact this office.

## 2020-11-15 NOTE — Telephone Encounter (Signed)
Pt needs PA on medicaiton zolpidem (AMBIEN) 5 MG tablet [[id number]]   pt is going out of town on Wednesday 11/23. Pt did make an appt for today  With FNP Just for 1pm. Please advise.

## 2020-11-15 NOTE — Progress Notes (Signed)
11/22/20213:30 PM  Connie Meyer 11-08-1951, 69 y.o., female [id number]  Chief Complaint  Patient presents with  . problems sleeping    refill zolpidem     HPI:   Patient is a 69 y.o. female with past medical history significant for CVA,  who presents today for Medication follow up.  Traveling soon to [facility] To see her daughter who is pregnant  Needs refills of zolpidem Needs a prior-auth  HTN BP Readings from Last 3 Encounters:  11/15/20 (!) 160/89  07/14/20 133/73  03/23/20 (!) 152/76   Amlodipine 5mg  Takes BP at home 140/70   Depression screen [facility] 2/9 08/03/2020 07/14/2020 11/24/2019  Decreased Interest 0 0 1  Down, Depressed, Hopeless 0 0 1  PHQ - 2 Score 0 0 2  Altered sleeping - - 3  Tired, decreased energy - - 1  Change in appetite - - 1  Feeling bad or failure about yourself  - - 1  Trouble concentrating - - 1  Moving slowly or fidgety/restless - - 0  Suicidal thoughts - - 0  PHQ-9 Score - - 9  Difficult doing work/chores - - -  Some recent data might be hidden    Fall Risk  08/03/2020 07/14/2020 06/24/2020 03/23/2020 10/16/2019  Falls in the past year? - 0 0 0 0  Number falls in past yr: 0 0 - - 0  Injury with Fall? 0 0 - - 0  Risk Factor Category  - - - - -  Risk for fall due to : No Fall Risks - - - -  Follow up Falls evaluation completed Falls evaluation completed;Education provided Falls evaluation completed - -     No Known Allergies  Prior to Admission medications   Medication Sig Start Date End Date Taking? Authorizing Provider  amLODipine (NORVASC) 5 MG tablet TAKE 1 TABLET(5 MG) BY MOUTH DAILY 06/24/20   [name], MD  apixaban (ELIQUIS) 5 MG TABS tablet Take 1 tablet (5 mg total) by mouth 2 (two) times daily. 11/12/20   Maximiano Coss, NP  atorvastatin (LIPITOR) 40 MG tablet Take 1 tablet (40 mg total) by mouth daily at 6 PM. 06/24/20   [name], MD  bimatoprost (LATISSE) 0.03 % ophthalmic solution 1 application  daily. 07/25/19   [provider]  fluticasone (FLONASE) 50 MCG/ACT nasal spray SHAKE LIQUID AND USE 1 SPRAY IN EACH NOSTRIL TWICE DAILY 08/15/20   [name], MD  sertraline (ZOLOFT) 25 MG tablet TAKE 1 TABLET(25 MG) BY MOUTH DAILY 09/16/20   [name], MD  zolpidem (AMBIEN) 5 MG tablet Take 1 tablet (5 mg total) by mouth at bedtime. 06/24/20   [name], MD    Past Medical History:  Diagnosis Date  . Adjustment disorder with mixed anxiety and depressed mood 09/15/2019  . CVA (cerebral vascular accident) 08/11/2019   small cortical and subcortical right parietal lobe infarcts of cryptogenic etiology  . Dyslipidemia 08/12/2019  . History of bilateral mastectomy   . 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 No further therapy. Currently in remission.  [name] HTN (hypertension)   . Hypertension    Phreesia 06/21/2020  . Hypokalemia 06/14/2019  . Impingement syndrome of right shoulder 01/27/2020  . Insomnia 09/15/2019  . Stroke [facility])    Phreesia 06/21/2020    Past Surgical History:  Procedure Laterality Date  . ABDOMINAL HYSTERECTOMY N/A    Phreesia 06/21/2020  . BREAST  SURGERY    . BUBBLE STUDY  06/16/2019   Procedure: BUBBLE STUDY;  Surgeon: Fay Records, MD;  Location: [facility] ENDOSCOPY;  Service: Cardiovascular;;  . LOOP RECORDER INSERTION N/A 06/16/2019   Procedure: LOOP RECORDER INSERTION;  Surgeon: Evans Lance, MD;  Location: [place] CV LAB;  Service: Cardiovascular;  Laterality: N/A;  . LOOP RECORDER INSERTION N/A 08/12/2019   Procedure: LOOP RECORDER INSERTION;  Surgeon: Thompson Grayer, MD;  Location: [place] CV LAB;  Service: Cardiovascular;  Laterality: N/A;  . TEE WITHOUT CARDIOVERSION N/A 06/16/2019   Procedure: TRANSESOPHAGEAL ECHOCARDIOGRAM (TEE);  Surgeon: Fay Records, MD;  Location: [facility] ENDOSCOPY;  Service: Cardiovascular;  Laterality: N/A;    Social History   Tobacco Use  . Smoking status: Never  Smoker  . Smokeless tobacco: Never Used  Substance Use Topics  . Alcohol use: Yes    Alcohol/week: 1.0 standard drink    Types: 1 Glasses of wine per week    Family History  Problem Relation Age of Onset  . Hypertension Mother   . Hypertension Father     Review of Systems  Constitutional: Negative for chills, fever and malaise/fatigue.  Eyes: Negative for blurred vision and double vision.  Respiratory: Negative for cough, shortness of breath and wheezing.   Cardiovascular: Negative for chest pain, palpitations and leg swelling.  Gastrointestinal: Negative for abdominal pain, blood in stool, constipation, diarrhea, heartburn, nausea and vomiting.  Genitourinary: Negative for dysuria, frequency and hematuria.  Musculoskeletal: Negative for back pain and joint pain.  Skin: Negative for rash.  Neurological: Negative for dizziness, weakness and headaches.     OBJECTIVE:  Today's Vitals   11/15/20 1320  BP: (!) 160/89  Pulse: 97  Temp: (!) 97.2 F (36.2 C)  SpO2: 97%  Weight: 130 lb (59 kg)  Height: 5\' 6"  (1.676 m)   Body mass index is 20.98 kg/m.   Physical Exam Constitutional:      General: She is not in acute distress.    Appearance: Normal appearance. She is not ill-appearing.  HENT:     Head: Normocephalic.  Cardiovascular:     Rate and Rhythm: Normal rate and regular rhythm.     Pulses: Normal pulses.     Heart sounds: Normal heart sounds. No murmur heard.  No friction rub. No gallop.   Pulmonary:     Effort: Pulmonary effort is normal. No respiratory distress.     Breath sounds: Normal breath sounds. No stridor. No wheezing, rhonchi or rales.  Abdominal:     General: Bowel sounds are normal.     Palpations: Abdomen is soft.     Tenderness: There is no abdominal tenderness.  Musculoskeletal:     Right lower leg: No edema.     Left lower leg: No edema.  Skin:    General: Skin is warm and dry.  Neurological:     Mental Status: She is alert and oriented  to person, place, and time.  Psychiatric:        Mood and Affect: Mood normal.        Behavior: Behavior normal.     No results found for this or any previous visit (from the past 24 hour(s)).  No results found.   ASSESSMENT and PLAN  Problem List Items Addressed This Visit      Cardiovascular and Mediastinum   HTN (hypertension)   Relevant Medications   amLODipine (NORVASC) 10 MG tablet BP not at goal< 130/80 Increased amlodipine from 5mg  to 10mg   R/se/b discussed.     Other   Insomnia   Relevant Medications   zolpidem (AMBIEN) 5 MG tablet Refills sent. Clinic Nurse working on prior [place].    Other Visit Diagnoses    Need for prophylactic vaccination and inoculation against influenza    -  Primary   Relevant Orders   Flu Vaccine QUAD High Dose(Fluad) (Completed)      Return in about 3 months (around 02/15/2021).   Huston Foley Aleksi Brummet, FNP-BC Primary Care at [place], [place] [address] Ph.  [phone] Fax [phone]

## 2020-11-16 ENCOUNTER — Ambulatory Visit: Payer: Medicare Other | Admitting: Family Medicine

## 2020-11-23 ENCOUNTER — Telehealth: Payer: Self-pay | Admitting: *Deleted

## 2020-11-23 ENCOUNTER — Other Ambulatory Visit: Payer: Self-pay | Admitting: Family Medicine

## 2020-11-23 NOTE — Telephone Encounter (Signed)
Medication is denied by insurance.  Has the patient taken any other prescribed sleep aids in the past that have not worked?

## 2020-11-27 LAB — CUP PACEART REMOTE DEVICE CHECK
Date Time Interrogation Session: 20211127230331
Implantable Pulse Generator Implant Date: 20200818

## 2020-11-29 ENCOUNTER — Ambulatory Visit (INDEPENDENT_AMBULATORY_CARE_PROVIDER_SITE_OTHER): Payer: Medicare Other

## 2020-11-29 DIAGNOSIS — I6300 Cerebral infarction due to thrombosis of unspecified precerebral artery: Secondary | ICD-10-CM | POA: Diagnosis not present

## 2020-12-07 ENCOUNTER — Telehealth: Payer: Self-pay | Admitting: Family Medicine

## 2020-12-07 NOTE — Telephone Encounter (Signed)
Please advise on if there is a substitute or anything else you would like to prescribe for the patient for sleep.

## 2020-12-07 NOTE — Telephone Encounter (Signed)
Patient called because insurance not longer paying for  zolpidem (AMBIEN) 5 MG tablet [[id number]]  Pharmacy:   [facility] [place] - [address]  [address] [place], [place] [address]  Phone:  [phone] Fax:  [phone]  DEA #:  [id number]  [place] Reason: --    Patient traveling 12/28 and medication not due until 12/29. Patient states she's unable to pay out of pocket. Please advise at [phone].

## 2020-12-08 ENCOUNTER — Other Ambulatory Visit: Payer: Self-pay | Admitting: Family Medicine

## 2020-12-08 ENCOUNTER — Encounter: Payer: Self-pay | Admitting: Family Medicine

## 2020-12-08 DIAGNOSIS — F5104 Psychophysiologic insomnia: Secondary | ICD-10-CM

## 2020-12-08 MED ORDER — DOXEPIN HCL 3 MG PO TABS: 3.0000 mg | ORAL_TABLET | Freq: Every evening | ORAL | 0 refills | Status: DC | PRN

## 2020-12-09 ENCOUNTER — Other Ambulatory Visit: Payer: Self-pay | Admitting: Registered Nurse

## 2020-12-09 NOTE — Telephone Encounter (Signed)
Requested medication (s) are due for refill today: Yes  Requested medication (s) are on the active medication list: Yes  Last refill:  11/12/20  Future visit scheduled: No  Notes to clinic:  Pt. Needs an appointment.    Requested Prescriptions  Pending Prescriptions Disp Refills   ELIQUIS 5 MG TABS tablet [Pharmacy Med Name: ELIQUIS 5MG  TABLETS] 60 tablet 0    Sig: TAKE 1 TABLET(5 MG) BY MOUTH TWICE DAILY      Hematology:  Anticoagulants Passed - 12/09/2020  2:40 PM      Passed - HGB in normal range and within 360 days    Hemoglobin  Date Value Ref Range Status  07/16/2020 12.9 11.1 - 15.9 g/dL Final   HGB  Date Value Ref Range Status  07/08/2007 11.2 (L) 11.6 - 15.9 g/dL Final          Passed - PLT in normal range and within 360 days    Platelets  Date Value Ref Range Status  07/16/2020 190 150 - 450 x10E3/uL Final   Platelet Count, POC  Date Value Ref Range Status  03/21/2018 227 142 - 424 K/uL Final          Passed - HCT in normal range and within 360 days    HCT  Date Value Ref Range Status  07/08/2007 33.4 (L) 34.8 - 46.6 % Final   Hematocrit  Date Value Ref Range Status  07/16/2020 39.5 34.0 - 46.6 % Final          Passed - Cr in normal range and within 360 days    Creatinine, Ser  Date Value Ref Range Status  07/16/2020 0.86 0.57 - 1.00 mg/dL Final          Passed - Valid encounter within last 12 months    Recent Outpatient Visits           3 weeks ago Need for prophylactic vaccination and inoculation against influenza   [facility] at [facility], Laurita Quint, FNP   4 months ago Post-viral cough syndrome   Primary Care at [facility], Lilia Argue, MD   4 months ago Current use of long term anticoagulation   Primary Care at [facility], Lilia Argue, MD   4 months ago Medicare annual wellness visit, subsequent   Primary Care at [facility], Ines Bloomer, MD   5 months ago Essential hypertension   Primary Care at [facility], Lilia Argue, MD

## 2020-12-09 NOTE — Progress Notes (Signed)
Carelink Summary Report / Loop Recorder 

## 2020-12-16 ENCOUNTER — Other Ambulatory Visit: Payer: Self-pay | Admitting: Family Medicine

## 2020-12-16 DIAGNOSIS — F5104 Psychophysiologic insomnia: Secondary | ICD-10-CM

## 2020-12-16 MED ORDER — DOXEPIN HCL 3 MG PO TABS: 3.0000 mg | ORAL_TABLET | Freq: Every evening | ORAL | 2 refills | Status: DC | PRN

## 2021-01-11 ENCOUNTER — Ambulatory Visit (INDEPENDENT_AMBULATORY_CARE_PROVIDER_SITE_OTHER): Payer: Medicare Other

## 2021-01-11 DIAGNOSIS — I6300 Cerebral infarction due to thrombosis of unspecified precerebral artery: Secondary | ICD-10-CM | POA: Diagnosis not present

## 2021-01-11 LAB — CUP PACEART REMOTE DEVICE CHECK
Date Time Interrogation Session: 20220108230321
Implantable Pulse Generator Implant Date: 20200818

## 2021-01-13 ENCOUNTER — Encounter: Payer: Self-pay | Admitting: Orthopaedic Surgery

## 2021-01-13 ENCOUNTER — Ambulatory Visit (INDEPENDENT_AMBULATORY_CARE_PROVIDER_SITE_OTHER): Payer: Medicare Other | Admitting: Orthopaedic Surgery

## 2021-01-13 DIAGNOSIS — M7541 Impingement syndrome of right shoulder: Secondary | ICD-10-CM | POA: Diagnosis not present

## 2021-01-13 MED ORDER — BUPIVACAINE HCL 0.25 % IJ SOLN
2.0000 mL | INTRAMUSCULAR | Status: AC | PRN
  Administered 2021-01-13: 2 mL via INTRA_ARTICULAR

## 2021-01-13 MED ORDER — METHYLPREDNISOLONE ACETATE 40 MG/ML IJ SUSP
80.0000 mg | INTRAMUSCULAR | Status: AC | PRN
  Administered 2021-01-13: 80 mg via INTRA_ARTICULAR

## 2021-01-13 NOTE — Progress Notes (Signed)
Office Visit Note   Patient: Connie Meyer           Date of Birth: 10/21/1951           MRN: [id number] Visit Date: 01/13/2021              Requested by: Jacelyn Pi, Lilia Argue, MD [place],  [place] [address] PCP: Jacelyn Pi, Lilia Argue, MD   Assessment & Plan: Visit Diagnoses:  1. Impingement syndrome of right shoulder     Plan: Ms. Guest was seen a year ago for evaluation of right shoulder pain consistent with impingement syndrome.  I injected the subacromial space with an excellent response only to have recurrent pain within the past month without further injury or trauma.  I am going to reinject the subacromial space with betamethasone and monitor response.  If no improvement over the next 3 to 4 weeks would obtain an MRI scan.  Patient will let me know  Follow-Up Instructions: Return if symptoms worsen or fail to improve.   Orders:  No orders of the defined types were placed in this encounter.  No orders of the defined types were placed in this encounter.     Procedures: Large Joint Inj: R subacromial bursa on 01/13/2021 1:25 PM Indications: pain and diagnostic evaluation Details: 25 G 1.5 in needle, anterolateral approach  Arthrogram: No  Medications: 80 mg methylPREDNISolone acetate 40 MG/ML; 2 mL bupivacaine 0.25 %  2 mL betamethasone injected in the subacromial space right shoulder with the Marcaine Consent was given by the patient. Immediately prior to procedure a time out was called to verify the correct patient, procedure, equipment, support staff and site/side marked as required. Patient was prepped and draped in the usual sterile fashion.       Clinical Data: No additional findings.   Subjective: Chief Complaint  Patient presents with  . Right Shoulder - Pain  Seen in February 2021 for evaluation of right shoulder pain.  Positive impingement symptoms.  I injected the subacromial space with cortisone with excellent  response until about a month ago.  No further injury or trauma.  Having some difficulty raising her arm overhead and sleeping on that side.  No numbness or tingling or neck problems.  HPI  Review of Systems   Objective: Vital Signs: There were no vitals taken for this visit.  Physical Exam Constitutional:      Appearance: She is well-developed and well-nourished.  HENT:     Mouth/Throat:     Mouth: Oropharynx is clear and moist.  Eyes:     Extraocular Movements: EOM normal.     Pupils: Pupils are equal, round, and reactive to light.  Pulmonary:     Effort: Pulmonary effort is normal.  Skin:    General: Skin is warm and dry.  Neurological:     Mental Status: She is alert and oriented to person, place, and time.  Psychiatric:        Mood and Affect: Mood and affect normal.        Behavior: Behavior normal.     Ortho Exam awake alert and oriented x3.  Comfortable sitting.  Able to place right arm fully overhead with a circuitous arc of motion.  Positive impingement and empty can testing.  A little bit of grating with internal and external rotation.  Biceps appears to be intact skin intact  Specialty Comments:  No specialty comments available.  Imaging: No results found.  PMFS History: Patient Active Problem List   Diagnosis Date Noted  . H/O: CVA (cerebrovascular accident) 06/24/2020  . Current use of long term anticoagulation 06/24/2020  . Impingement syndrome of right shoulder 01/27/2020  . Adjustment disorder with mixed anxiety and depressed mood 09/15/2019  . Insomnia 09/15/2019  . Dyslipidemia 08/12/2019  . CVA (cerebral vascular accident) 08/11/2019  . HTN (hypertension) 06/14/2019  . Hypokalemia 06/14/2019  . History of bilateral mastectomy   . Rib pain on left side 09/18/2016  . Hiatal hernia 09/05/2016  . Muscle contracture 05/20/2014  . Painful sexual intercourse 05/20/2014  . Scar condition and fibrosis of skin 05/20/2014  . History of breast cancer  2003   Past Medical History:  Diagnosis Date  . Adjustment disorder with mixed anxiety and depressed mood 09/15/2019  . CVA (cerebral vascular accident) 08/11/2019   small cortical and subcortical right parietal lobe infarcts of cryptogenic etiology  . Dyslipidemia 08/12/2019  . History of bilateral mastectomy   . 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 No further therapy. Currently in remission.  [name] HTN (hypertension)   . Hypertension    Phreesia 06/21/2020  . Hypokalemia 06/14/2019  . Impingement syndrome of right shoulder 01/27/2020  . Insomnia 09/15/2019  . Stroke [facility])    Phreesia 06/21/2020    Family History  Problem Relation Age of Onset  . Hypertension Mother   . Hypertension Father     Past Surgical History:  Procedure Laterality Date  . ABDOMINAL HYSTERECTOMY N/A    Phreesia 06/21/2020  . BREAST SURGERY    . BUBBLE STUDY  06/16/2019   Procedure: BUBBLE STUDY;  Surgeon: Fay Records, MD;  Location: [facility] ENDOSCOPY;  Service: Cardiovascular;;  . LOOP RECORDER INSERTION N/A 06/16/2019   Procedure: LOOP RECORDER INSERTION;  Surgeon: Evans Lance, MD;  Location: [place] CV LAB;  Service: Cardiovascular;  Laterality: N/A;  . LOOP RECORDER INSERTION N/A 08/12/2019   Procedure: LOOP RECORDER INSERTION;  Surgeon: Thompson Grayer, MD;  Location: [place] CV LAB;  Service: Cardiovascular;  Laterality: N/A;  . TEE WITHOUT CARDIOVERSION N/A 06/16/2019   Procedure: TRANSESOPHAGEAL ECHOCARDIOGRAM (TEE);  Surgeon: Fay Records, MD;  Location: [facility] ENDOSCOPY;  Service: Cardiovascular;  Laterality: N/A;   Social History   Occupational History  . Occupation: Retired    Comment: Catering manager  Tobacco Use  . Smoking status: Never Smoker  . Smokeless tobacco: Never Used  Vaping Use  . Vaping Use: Never used  Substance and Sexual Activity  . Alcohol use: Yes    Alcohol/week: 1.0 standard drink    Types: 1 Glasses of wine per week  .  Drug use: No  . Sexual activity: Not Currently     Garald Balding, MD   Note - This record has been created using [employer].  Chart creation errors have been sought, but may not always  have been located. Such creation errors do not reflect on  the standard of medical care.

## 2021-01-21 ENCOUNTER — Other Ambulatory Visit: Payer: Self-pay | Admitting: Family Medicine

## 2021-01-25 NOTE — Progress Notes (Signed)
Carelink Summary Report / Loop Recorder 

## 2021-01-26 ENCOUNTER — Encounter: Payer: Self-pay | Admitting: *Deleted

## 2021-02-14 ENCOUNTER — Ambulatory Visit (INDEPENDENT_AMBULATORY_CARE_PROVIDER_SITE_OTHER): Payer: Medicare Other

## 2021-02-14 DIAGNOSIS — I6300 Cerebral infarction due to thrombosis of unspecified precerebral artery: Secondary | ICD-10-CM | POA: Diagnosis not present

## 2021-02-15 LAB — CUP PACEART REMOTE DEVICE CHECK
Date Time Interrogation Session: 20220220230518
Implantable Pulse Generator Implant Date: 20200818

## 2021-02-17 NOTE — Progress Notes (Signed)
Carelink Summary Report / Loop Recorder 

## 2021-02-21 ENCOUNTER — Other Ambulatory Visit: Payer: Self-pay | Admitting: Family Medicine

## 2021-02-21 ENCOUNTER — Encounter: Payer: Self-pay | Admitting: Family Medicine

## 2021-02-21 DIAGNOSIS — I10 Essential (primary) hypertension: Secondary | ICD-10-CM

## 2021-02-21 MED ORDER — AMLODIPINE BESYLATE 10 MG PO TABS
ORAL_TABLET | ORAL | 3 refills | Status: DC
Start: 2021-02-21 — End: 2021-02-21

## 2021-02-21 MED ORDER — AMLODIPINE BESYLATE 10 MG PO TABS
10.0000 mg | ORAL_TABLET | Freq: Every day | ORAL | 3 refills | Status: DC
Start: 2021-02-21 — End: 2021-09-28

## 2021-02-21 MED ORDER — AMLODIPINE BESYLATE 5 MG PO TABS: 10.0000 mg | ORAL_TABLET | Freq: Every day | ORAL | 0 refills | Status: DC

## 2021-02-21 NOTE — Telephone Encounter (Signed)
Medication Refill - Medication: Patient requesting  amLODipine (NORVASC) 10 MG tablet and states she did not realize she was taking 10 MG she thought she was taking 5 MG now patient is out.    Has the patient contacted their pharmacy? Yes,  left a voice mail message today.      Preferred Pharmacy (with phone number or street name):  [id number] [place], [place] - [place] Phone:  [phone]  Fax:  [phone]       Agent: Please be advised that RX refills may take up to 3 business days. We ask that you follow-up with your pharmacy.

## 2021-02-21 NOTE — Telephone Encounter (Signed)
Sent!

## 2021-02-21 NOTE — Telephone Encounter (Signed)
I called pt to clarify Norvasc dosage and how many she had been taking.   When she was seeing Dr. Pamella Pert she was taking 5 mg then when she started seeing [facility] the dosage was changed to 10 mg and pt did not realize this.   She noticed on her bottle that it was 10 mg tablets not 5 mg so that's why she is out of the rx early.   She has been taking 2 10 mg tablets daily.   Insurance co and pharmacy will not refill the rx as a result. I let her know someone would be getting back with her with further directions regarding the Norvasc.   She was agreeable to this plan.

## 2021-02-21 NOTE — Telephone Encounter (Signed)
Patient is stating that her insurance for 5mg  twice daily for insurance to fill.

## 2021-02-21 NOTE — Telephone Encounter (Signed)
Routing to office to rewrite prescription This encounter was created in error - please disregard.

## 2021-02-21 NOTE — Telephone Encounter (Signed)
I sent the refills. Have her continue 10 mg a day.

## 2021-02-21 NOTE — Telephone Encounter (Signed)
Patient states she is out of amlodipine because she usually take 5mg  twice daily and didn't realize that it was increase to 10mg  and has been taking those twice now the pharmacy and insurance is refusing to refill or pay because its too early. Patient would like to know what is the next step because she  has no medication left.

## 2021-02-25 ENCOUNTER — Other Ambulatory Visit: Payer: Self-pay

## 2021-02-25 ENCOUNTER — Telehealth: Payer: Self-pay | Admitting: Orthopaedic Surgery

## 2021-02-25 DIAGNOSIS — G8929 Other chronic pain: Secondary | ICD-10-CM

## 2021-02-25 DIAGNOSIS — M25511 Pain in right shoulder: Secondary | ICD-10-CM

## 2021-02-25 NOTE — Telephone Encounter (Signed)
Spoke with patient and ordered MRI.

## 2021-02-25 NOTE — Telephone Encounter (Signed)
MRI right shoulder-? RCT

## 2021-02-25 NOTE — Telephone Encounter (Signed)
Please advise 

## 2021-02-25 NOTE — Telephone Encounter (Signed)
Pt called and states injection did not work. She said Dr.Whitfield told her to call and let him know so he can schedule her an MRI.CB [phone]

## 2021-03-07 ENCOUNTER — Telehealth: Payer: Self-pay | Admitting: Orthopaedic Surgery

## 2021-03-07 NOTE — Telephone Encounter (Signed)
Called patient left voicemail message to return call    Pt need to schedule MRI review with Dr Durward Fortes  MRI scheduled for 03/11/2021

## 2021-03-10 ENCOUNTER — Ambulatory Visit (HOSPITAL_COMMUNITY)
Admission: RE | Admit: 2021-03-10 | Discharge: 2021-03-10 | Disposition: A | Discharge status: Home / Self Care | Payer: Medicare Other | Source: Ambulatory Visit | Attending: Orthopaedic Surgery | Admitting: Orthopaedic Surgery

## 2021-03-10 ENCOUNTER — Other Ambulatory Visit: Payer: Self-pay

## 2021-03-10 DIAGNOSIS — M25511 Pain in right shoulder: Secondary | ICD-10-CM | POA: Diagnosis not present

## 2021-03-10 DIAGNOSIS — G8929 Other chronic pain: Secondary | ICD-10-CM | POA: Insufficient documentation

## 2021-03-14 ENCOUNTER — Encounter (HOSPITAL_COMMUNITY): Payer: Self-pay

## 2021-03-14 ENCOUNTER — Emergency Department (HOSPITAL_COMMUNITY): Payer: Medicare Other

## 2021-03-14 ENCOUNTER — Ambulatory Visit: Payer: Self-pay | Admitting: *Deleted

## 2021-03-14 ENCOUNTER — Emergency Department (HOSPITAL_COMMUNITY)
Admission: EM | Admit: 2021-03-14 | Discharge: 2021-03-15 | Disposition: A | Discharge status: Home / Self Care | Payer: Medicare Other | Attending: Emergency Medicine | Admitting: Emergency Medicine

## 2021-03-14 DIAGNOSIS — Z79899 Other long term (current) drug therapy: Secondary | ICD-10-CM | POA: Diagnosis not present

## 2021-03-14 DIAGNOSIS — R0602 Shortness of breath: Secondary | ICD-10-CM

## 2021-03-14 DIAGNOSIS — I10 Essential (primary) hypertension: Secondary | ICD-10-CM | POA: Insufficient documentation

## 2021-03-14 DIAGNOSIS — R0789 Other chest pain: Secondary | ICD-10-CM

## 2021-03-14 DIAGNOSIS — Z8673 Personal history of transient ischemic attack (TIA), and cerebral infarction without residual deficits: Secondary | ICD-10-CM | POA: Insufficient documentation

## 2021-03-14 DIAGNOSIS — Z7901 Long term (current) use of anticoagulants: Secondary | ICD-10-CM | POA: Diagnosis not present

## 2021-03-14 DIAGNOSIS — Z853 Personal history of malignant neoplasm of breast: Secondary | ICD-10-CM | POA: Insufficient documentation

## 2021-03-14 LAB — BASIC METABOLIC PANEL
Anion gap: 11 (ref 5–15)
BUN: 15 mg/dL (ref 8–23)
CO2: 22 mmol/L (ref 22–32)
Calcium: 9.7 mg/dL (ref 8.9–10.3)
Chloride: 107 mmol/L (ref 98–111)
Creatinine, Ser: 0.79 mg/dL (ref 0.44–1.00)
GFR, Estimated: >60 mL/min (ref >60)
Glucose, Bld: 122 mg/dL — ABNORMAL HIGH (ref 70–99)
Potassium: 3.4 mmol/L — ABNORMAL LOW (ref 3.5–5.1)
Sodium: 140 mmol/L (ref 135–145)

## 2021-03-14 LAB — CBC WITH DIFFERENTIAL/PLATELET
Abs Immature Granulocytes: 0.01 10*3/uL (ref 0.00–0.07)
Basophils Absolute: 0.1 10*3/uL (ref 0.0–0.1)
Basophils Relative: 1 %
Eosinophils Absolute: 0.2 10*3/uL (ref 0.0–0.5)
Eosinophils Relative: 4 %
HCT: 40.8 % (ref 36.0–46.0)
Hemoglobin: 13.4 g/dL (ref 12.0–15.0)
Immature Granulocytes: 0 %
Lymphocytes Relative: 34 %
Lymphs Abs: 1.6 10*3/uL (ref 0.7–4.0)
MCH: 31.6 pg (ref 26.0–34.0)
MCHC: 32.8 g/dL (ref 30.0–36.0)
MCV: 96.2 fL (ref 80.0–100.0)
Monocytes Absolute: 0.4 10*3/uL (ref 0.1–1.0)
Monocytes Relative: 9 %
Neutro Abs: 2.4 10*3/uL (ref 1.7–7.7)
Neutrophils Relative %: 52 %
Platelets: 244 10*3/uL (ref 150–400)
RBC: 4.24 MIL/uL (ref 3.87–5.11)
RDW: 12.6 % (ref 11.5–15.5)
WBC: 4.7 10*3/uL (ref 4.0–10.5)
nRBC: 0 % (ref 0.0–0.2)

## 2021-03-14 LAB — BRAIN NATRIURETIC PEPTIDE: B Natriuretic Peptide: 78.5 pg/mL (ref 0.0–100.0)

## 2021-03-14 LAB — D-DIMER, QUANTITATIVE: D-Dimer, Quant: 0.5 ug/mL-FEU (ref 0.00–0.50)

## 2021-03-14 LAB — TROPONIN I (HIGH SENSITIVITY)
Troponin I (High Sensitivity): 7 ng/L (ref <18)
Troponin I (High Sensitivity): 12 ng/L (ref <18)

## 2021-03-14 MED ORDER — POTASSIUM CHLORIDE CRYS ER 20 MEQ PO TBCR
40.0000 meq | EXTENDED_RELEASE_TABLET | Freq: Once | ORAL | Status: AC
  Administered 2021-03-14: 40 meq via ORAL
  Filled 2021-03-14: qty 2

## 2021-03-14 MED ORDER — OXYCODONE-ACETAMINOPHEN 5-325 MG PO TABS
1.0000 | ORAL_TABLET | Freq: Once | ORAL | Status: AC
  Administered 2021-03-14: 1 via ORAL
  Filled 2021-03-14: qty 1

## 2021-03-14 NOTE — Discharge Instructions (Addendum)
Call your primary care doctor or specialist as discussed in the next 2-3 days.   Return immediately back to the ER if:  Your symptoms worsen within the next 12-24 hours. You develop new symptoms such as new fevers, persistent vomiting, new pain, shortness of breath, or new weakness or numbness, or if you have any other concerns.  

## 2021-03-14 NOTE — Telephone Encounter (Signed)
Appt if possible if not needs to schedule with new office

## 2021-03-14 NOTE — ED Triage Notes (Signed)
Pt presents with c/o shortness of breath. Pt reports that her shortness of breath has been there since she had Covid in January but has been getting worse over the last week.

## 2021-03-14 NOTE — ED Provider Notes (Addendum)
[facility] DEPT Provider Note   CSN: [id number] Arrival date & time: 03/14/21  1258     History Chief Complaint  Patient presents with  . Shortness of Breath    Connie Meyer is a 70 y.o. female.  Patient comes in with chief complaint of chest tightness shortness of breath.  She states is been going on for about 3 months.  She noticed it when she had COVID 2 months ago and it has always been present since then.  Over the course the last week she feels like it has become more intense and so she presents to the ER.  She has a history of strokes in the past and has been on Eliquis.  Otherwise denies any new fevers or cough.  Denies any vomiting or diarrhea.  Describes chest tightness as a mid chest tightness sensation has persisted throughout the day for the past several months.        Past Medical History:  Diagnosis Date  . Adjustment disorder with mixed anxiety and depressed mood 09/15/2019  . CVA (cerebral vascular accident) 08/11/2019   small cortical and subcortical right parietal lobe infarcts of cryptogenic etiology  . Dyslipidemia 08/12/2019  . History of bilateral mastectomy   . 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 No further therapy. Currently in remission.  [name] HTN (hypertension)   . Hypertension    Phreesia 06/21/2020  . Hypokalemia 06/14/2019  . Impingement syndrome of right shoulder 01/27/2020  . Insomnia 09/15/2019  . Stroke [facility])    Phreesia 06/21/2020    Patient Active Problem List   Diagnosis Date Noted  . H/O: CVA (cerebrovascular accident) 06/24/2020  . Current use of long term anticoagulation 06/24/2020  . Impingement syndrome of right shoulder 01/27/2020  . Adjustment disorder with mixed anxiety and depressed mood 09/15/2019  . Insomnia 09/15/2019  . Dyslipidemia 08/12/2019  . CVA (cerebral vascular accident) 08/11/2019  . HTN (hypertension) 06/14/2019  . Hypokalemia  06/14/2019  . History of bilateral mastectomy   . Rib pain on left side 09/18/2016  . Hiatal hernia 09/05/2016  . Muscle contracture 05/20/2014  . Painful sexual intercourse 05/20/2014  . Scar condition and fibrosis of skin 05/20/2014  . History of breast cancer 2003    Past Surgical History:  Procedure Laterality Date  . ABDOMINAL HYSTERECTOMY N/A    Phreesia 06/21/2020  . BREAST SURGERY    . BUBBLE STUDY  06/16/2019   Procedure: BUBBLE STUDY;  Surgeon: Fay Records, MD;  Location: [facility] ENDOSCOPY;  Service: Cardiovascular;;  . LOOP RECORDER INSERTION N/A 06/16/2019   Procedure: LOOP RECORDER INSERTION;  Surgeon: Evans Lance, MD;  Location: [facility] CV LAB;  Service: Cardiovascular;  Laterality: N/A;  . LOOP RECORDER INSERTION N/A 08/12/2019   Procedure: LOOP RECORDER INSERTION;  Surgeon: Thompson Grayer, MD;  Location: [facility] CV LAB;  Service: Cardiovascular;  Laterality: N/A;  . TEE WITHOUT CARDIOVERSION N/A 06/16/2019   Procedure: TRANSESOPHAGEAL ECHOCARDIOGRAM (TEE);  Surgeon: Fay Records, MD;  Location: [facility] ENDOSCOPY;  Service: Cardiovascular;  Laterality: N/A;     OB History   No obstetric history on file.     Family History  Problem Relation Age of Onset  . Hypertension Mother   . Hypertension Father     Social History   Tobacco Use  . Smoking status: Never Smoker  . Smokeless tobacco: Never Used  Vaping Use  . Vaping Use: Never used  Substance Use Topics  . Alcohol use: Yes    Alcohol/week: 1.0 standard drink    Types: 1 Glasses of wine per week  . Drug use: No    Home Medications Prior to Admission medications   Medication Sig Start Date End Date Taking? Authorizing Provider  amLODipine (NORVASC) 10 MG tablet Take 1 tablet (10 mg total) by mouth daily. 02/21/21   Just, Laurita Quint, FNP  atorvastatin (LIPITOR) 40 MG tablet Take 1 tablet (40 mg total) by mouth daily at 6 PM. 06/24/20   Jacelyn Pi, Lilia Argue, MD  bimatoprost (LATISSE) 0.03 % ophthalmic  solution 1 application daily. 07/25/19   [provider]  Doxepin HCl 3 MG TABS Take 1 tablet (3 mg total) by mouth at bedtime as needed (Insomnia). 12/16/20 06/14/21  Just, Laurita Quint, FNP  ELIQUIS 5 MG TABS tablet TAKE 1 TABLET(5 MG) BY MOUTH TWICE DAILY 02/21/21   Just, Laurita Quint, FNP  fluticasone (FLONASE) 50 MCG/ACT nasal spray SHAKE LIQUID AND USE 1 SPRAY IN EACH NOSTRIL TWICE DAILY 08/15/20   Jacelyn Pi, Lilia Argue, MD  sertraline (ZOLOFT) 25 MG tablet TAKE 1 TABLET(25 MG) BY MOUTH DAILY 09/16/20   Jacelyn Pi, Lilia Argue, MD    Allergies    Patient has no known allergies.  Review of Systems   Review of Systems  Constitutional: Negative for fever.  HENT: Negative for ear pain.   Eyes: Negative for pain.  Respiratory: Negative for cough.   Cardiovascular: Positive for chest pain.  Gastrointestinal: Negative for abdominal pain.  Genitourinary: Negative for flank pain.  Musculoskeletal: Negative for back pain.  Skin: Negative for rash.  Neurological: Negative for headaches.    Physical Exam Updated Vital Signs BP 133/75   Pulse 76   Temp 97.9 F (36.6 C) (Oral)   Resp 20   Ht 5\' 6"  (1.676 m)   Wt 59 kg   SpO2 100%   BMI 20.98 kg/m   Physical Exam Constitutional:      General: She is not in acute distress.    Appearance: Normal appearance.  HENT:     Head: Normocephalic.     Nose: Nose normal.  Eyes:     Extraocular Movements: Extraocular movements intact.  Cardiovascular:     Rate and Rhythm: Normal rate.  Pulmonary:     Effort: Pulmonary effort is normal.  Musculoskeletal:        General: Normal range of motion.     Cervical back: Normal range of motion.     Right lower leg: No edema.     Left lower leg: No edema.  Neurological:     General: No focal deficit present.     Mental Status: She is alert. Mental status is at baseline.     ED Results / Procedures / Treatments   Labs (all labs ordered are listed, but only abnormal results are displayed) Labs  Reviewed  BASIC METABOLIC PANEL - Abnormal; Notable for the following components:      Result Value   Potassium 3.4 (*)    Glucose, Bld 122 (*)    All other components within normal limits  CBC WITH DIFFERENTIAL/PLATELET  BRAIN NATRIURETIC PEPTIDE  D-DIMER, QUANTITATIVE  TROPONIN I (HIGH SENSITIVITY)  TROPONIN I (HIGH SENSITIVITY)    EKG EKG Interpretation  Date/Time:  Monday March 14 2021 13:09:31 EDT Ventricular Rate:  84 PR Interval:    QRS Duration: 82 QT Interval:  374 QTC Calculation: 443 R Axis:   85 Text Interpretation:  Sinus rhythm Borderline right axis deviation Baseline wander in lead(s) V1 V2 V3 V4 V5 12 Lead; Mason-Likar Confirmed by Thamas Jaegers ([id number]) on 03/14/2021 5:54:16 PM   Radiology DG Chest 2 View  Result Date: 03/14/2021 CLINICAL DATA:  Shortness of breath. EXAM: CHEST - 2 VIEW COMPARISON:  Chest x-ray dated July 15, 2019. FINDINGS: The heart size and mediastinal contours are within normal limits. New loop recorder over the lower midline chest. Normal pulmonary vascularity. Unchanged biapical pleuroparenchymal scarring. No focal consolidation, pleural effusion, or pneumothorax. No acute osseous abnormality. Unchanged surgical clips in both axilla. IMPRESSION: 1. No acute cardiopulmonary disease. Electronically Signed   By: Titus Dubin M.D.   On: 03/14/2021 14:16    Procedures Procedures   Medications Ordered in ED Medications  potassium chloride SA (KLOR-CON) CR tablet 40 mEq (40 mEq Oral Given 03/14/21 1924)  oxyCODONE-acetaminophen (PERCOCET/ROXICET) 5-325 MG per tablet 1 tablet (1 tablet Oral Given 03/14/21 2233)    ED Course  I have reviewed the triage vital signs and the nursing notes.  Pertinent labs & imaging results that were available during my care of the patient were reviewed by me and considered in my medical decision making (see chart for details).    MDM Rules/Calculators/A&P                          Labs show normal chemistry.   Normal CBC and hemoglobin count.  Initial troponin is negative.  D-dimer is age-adjusted negative as well.  Serial troponins ordered, however second set was delayed.  Given second set of troponins being negative, patient to be discharged home.  Advising outpatient follow-up with her cardiologist within the week.  Advised immediate return for worsening pain fevers or any additional concerns.  Final Clinical Impression(s) / ED Diagnoses Final diagnoses:  Mid sternal chest pain  SOB (shortness of breath)    Rx / DC Orders ED Discharge Orders    None       Luna Fuse, MD 03/14/21 2256    Luna Fuse, MD 03/14/21 [phone]

## 2021-03-14 NOTE — ED Notes (Signed)
Played soft harp music in room for stress relief

## 2021-03-14 NOTE — Telephone Encounter (Signed)
Patient is calling to report that since she had COVID in December- her breathing has not recovered. Patient was walking 4 miles/day and is unable to exertion any energy without fatigue and SOB. [facility] has called during office lunch hours and I am unable to get her an appointment- please call patient to schedule.  Reason for Disposition . [1] Longstanding difficulty breathing (e.g., CHF, COPD, emphysema) AND [2] WORSE than normal  Answer Assessment - Initial Assessment Questions 1. RESPIRATORY STATUS: "Describe your breathing?" (e.g., wheezing, shortness of breath, unable to speak, severe coughing)      Any exertion causes labored breathing- tightness in chest 2. ONSET: "When did this breathing problem begin?"      progressively worse since December 3. PATTERN "Does the difficult breathing come and go, or has it been constant since it started?"     Constant- when lays flat on back, exeertion 4. SEVERITY: "How bad is your breathing?" (e.g., mild, moderate, severe)    - MILD: No SOB at rest, mild SOB with walking, speaks normally in sentences, can lay down, no retractions, pulse < 100.    - MODERATE: SOB at rest, SOB with minimal exertion and prefers to sit, cannot lie down flat, speaks in phrases, mild retractions, audible wheezing, pulse 100-120.    - SEVERE: Very SOB at rest, speaks in single words, struggling to breathe, sitting hunched forward, retractions, pulse > 120      moderate 5. RECURRENT SYMPTOM: "Have you had difficulty breathing before?" If Yes, ask: "When was the last time?" and "What happened that time?"      Never 6. CARDIAC HISTORY: "Do you have any history of heart disease?" (e.g., heart attack, angina, bypass surgery, angioplasty)      History of stroke 7. LUNG HISTORY: "Do you have any history of lung disease?"  (e.g., pulmonary embolus, asthma, emphysema)     no 8. CAUSE: "What do you think is causing the breathing problem?"      Post COVID symptoms 9. OTHER SYMPTOMS:  "Do you have any other symptoms? (e.g., dizziness, runny nose, cough, chest pain, fever)     no 10. PREGNANCY: "Is there any chance you are pregnant?" "When was your last menstrual period?"       n/a 11. TRAVEL: "Have you traveled out of the country in the last month?" (e.g., travel history, exposures)       no  Protocols used: BREATHING DIFFICULTY-A-AH

## 2021-03-15 ENCOUNTER — Telehealth: Payer: Self-pay | Admitting: Family Medicine

## 2021-03-18 ENCOUNTER — Other Ambulatory Visit: Payer: Self-pay | Admitting: Family Medicine

## 2021-03-20 LAB — CUP PACEART REMOTE DEVICE CHECK
Date Time Interrogation Session: 20220325230709
Implantable Pulse Generator Implant Date: 20200818

## 2021-03-21 ENCOUNTER — Ambulatory Visit (INDEPENDENT_AMBULATORY_CARE_PROVIDER_SITE_OTHER): Payer: Medicare Other

## 2021-03-21 DIAGNOSIS — I6300 Cerebral infarction due to thrombosis of unspecified precerebral artery: Secondary | ICD-10-CM

## 2021-03-22 ENCOUNTER — Ambulatory Visit (INDEPENDENT_AMBULATORY_CARE_PROVIDER_SITE_OTHER): Payer: Medicare Other | Admitting: Orthopaedic Surgery

## 2021-03-22 ENCOUNTER — Other Ambulatory Visit: Payer: Self-pay

## 2021-03-22 ENCOUNTER — Encounter: Payer: Self-pay | Admitting: Orthopaedic Surgery

## 2021-03-22 VITALS — Ht 66.0 in | Wt 130.0 lb

## 2021-03-22 DIAGNOSIS — M7541 Impingement syndrome of right shoulder: Secondary | ICD-10-CM | POA: Diagnosis not present

## 2021-03-22 DIAGNOSIS — G8929 Other chronic pain: Secondary | ICD-10-CM | POA: Diagnosis not present

## 2021-03-22 DIAGNOSIS — M25511 Pain in right shoulder: Secondary | ICD-10-CM | POA: Diagnosis not present

## 2021-03-22 DIAGNOSIS — I6300 Cerebral infarction due to thrombosis of unspecified precerebral artery: Secondary | ICD-10-CM

## 2021-03-22 NOTE — Progress Notes (Signed)
Office Visit Note   Patient: Connie Meyer           Date of Birth: 08/21/1951           MRN: [id number] Visit Date: 03/22/2021              Requested by: Jacelyn Pi, Lilia Argue, MD [place],   [id number] PCP: Jacelyn Pi, Lilia Argue, MD   Assessment & Plan: Visit Diagnoses:  1. Chronic right shoulder pain   2. Impingement syndrome of right shoulder     Plan: MRI scan of right shoulder demonstrated moderate stenosis of this supraspinatus with a small partial-thickness bursal surface tear anteriorly.  Mild tendinosis of the infraspinatus tendon.  Teres minor and subscapularis intact.  No muscle atrophy or muscle edema.  Mild tendinosis of the intra-articular portion of long head of the biceps.  Mild arthropathy of the [facility] joint.  No joint effusion or chondral defects of the glenohumeral joint.  Labrum was intact.  No aggressive bony lesions.  Long discussion with Connie Meyer regarding all of the above.  She certainly is having a fair amount of discomfort and has not had much response to cortisone.  With a relatively benign MRI scan I think it is worth trying a course of physical therapy.  I am somewhat hesitant to consider arthroscopy based on the MRI scan but at some point she may be a candidate for an SCD and DCR  Follow-Up Instructions: Return in about 1 month (around 04/22/2021).   Orders:  Orders Placed This Encounter  Procedures  . Ambulatory referral to Physical Therapy   No orders of the defined types were placed in this encounter.     Procedures: No procedures performed   Clinical Data: No additional findings.   Subjective: Chief Complaint  Patient presents with  . Right Shoulder - Follow-up    MRI review  Patient presents today for follow up on her right shoulder. She had an MRI done and is here today for those results.  No change in symptoms.  HPI  Review of Systems   Objective: Vital Signs: Ht 5\' 6"  (1.676 m)   Wt 130  lb (59 kg)   BMI 20.98 kg/m   Physical Exam Constitutional:      Appearance: She is well-developed.  Eyes:     Pupils: Pupils are equal, round, and reactive to light.  Pulmonary:     Effort: Pulmonary effort is normal.  Skin:    General: Skin is warm and dry.  Neurological:     Mental Status: She is alert and oriented to person, place, and time.  Psychiatric:        Behavior: Behavior normal.     Ortho Exam right arm with full overhead motion but with a circuitous arc of motion.  Minimally positive impingement and extreme of external rotation.  I did not feel any popping or clicking but on occasion she does feel that.  Minimal discomfort at the [facility] joint and the anterior subacromial region.  Minimally positive Speed sign.  Appears to have good grip and release and good strength.  Specialty Comments:  No specialty comments available.  Imaging: No results found.   PMFS History: Patient Active Problem List   Diagnosis Date Noted  . H/O: CVA (cerebrovascular accident) 06/24/2020  . Current use of long term anticoagulation 06/24/2020  . Impingement syndrome of right shoulder 01/27/2020  . Adjustment disorder with mixed anxiety and depressed mood 09/15/2019  .  Insomnia 09/15/2019  . Dyslipidemia 08/12/2019  . CVA (cerebral vascular accident) 08/11/2019  . HTN (hypertension) 06/14/2019  . Hypokalemia 06/14/2019  . History of bilateral mastectomy   . Rib pain on left side 09/18/2016  . Hiatal hernia 09/05/2016  . Muscle contracture 05/20/2014  . Painful sexual intercourse 05/20/2014  . Scar condition and fibrosis of skin 05/20/2014  . History of breast cancer 2003   Past Medical History:  Diagnosis Date  . Adjustment disorder with mixed anxiety and depressed mood 09/15/2019  . CVA (cerebral vascular accident) 08/11/2019   small cortical and subcortical right parietal lobe infarcts of cryptogenic etiology  . Dyslipidemia 08/12/2019  . History of bilateral mastectomy   .  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 No further therapy. Currently in remission.  [name] HTN (hypertension)   . Hypertension    Phreesia 06/21/2020  . Hypokalemia 06/14/2019  . Impingement syndrome of right shoulder 01/27/2020  . Insomnia 09/15/2019  . Stroke [facility])    Phreesia 06/21/2020    Family History  Problem Relation Age of Onset  . Hypertension Mother   . Hypertension Father     Past Surgical History:  Procedure Laterality Date  . ABDOMINAL HYSTERECTOMY N/A    Phreesia 06/21/2020  . BREAST SURGERY    . BUBBLE STUDY  06/16/2019   Procedure: BUBBLE STUDY;  Surgeon: Fay Records, MD;  Location: [facility] ENDOSCOPY;  Service: Cardiovascular;;  . LOOP RECORDER INSERTION N/A 06/16/2019   Procedure: LOOP RECORDER INSERTION;  Surgeon: Evans Lance, MD;  Location: [facility] CV LAB;  Service: Cardiovascular;  Laterality: N/A;  . LOOP RECORDER INSERTION N/A 08/12/2019   Procedure: LOOP RECORDER INSERTION;  Surgeon: Thompson Grayer, MD;  Location: [facility] CV LAB;  Service: Cardiovascular;  Laterality: N/A;  . TEE WITHOUT CARDIOVERSION N/A 06/16/2019   Procedure: TRANSESOPHAGEAL ECHOCARDIOGRAM (TEE);  Surgeon: Fay Records, MD;  Location: [facility] ENDOSCOPY;  Service: Cardiovascular;  Laterality: N/A;   Social History   Occupational History  . Occupation: Retired    Comment: Catering manager  Tobacco Use  . Smoking status: Never Smoker  . Smokeless tobacco: Never Used  Vaping Use  . Vaping Use: Never used  Substance and Sexual Activity  . Alcohol use: Yes    Alcohol/week: 1.0 standard drink    Types: 1 Glasses of wine per week  . Drug use: No  . Sexual activity: Not Currently

## 2021-03-28 DIAGNOSIS — S46011A Strain of muscle(s) and tendon(s) of the rotator cuff of right shoulder, initial encounter: Secondary | ICD-10-CM | POA: Diagnosis not present

## 2021-03-30 ENCOUNTER — Telehealth: Payer: Self-pay | Admitting: *Deleted

## 2021-03-30 NOTE — Telephone Encounter (Signed)
Received clearance form for pt relating to R shoulder scope by [facility] Dr. Justice Britain. Last seen 02-2020.

## 2021-03-30 NOTE — Telephone Encounter (Signed)
Pt has been scheduled.  °

## 2021-03-30 NOTE — Telephone Encounter (Signed)
Called and LMVM for pt that will need to come in for an appointment since seen last 03-23-2020.  She can call back and make appt (may use hospital f/u or OV).

## 2021-03-30 NOTE — Telephone Encounter (Signed)
Noted  

## 2021-03-30 NOTE — Telephone Encounter (Signed)
Do you have clearance form?

## 2021-03-30 NOTE — Telephone Encounter (Signed)
As she has not been seen in over the past year, she would need to come in for evaluation and full clearance which is requested on the clearance form. Thank you

## 2021-03-31 ENCOUNTER — Encounter: Payer: Self-pay | Admitting: Adult Health

## 2021-03-31 ENCOUNTER — Ambulatory Visit (INDEPENDENT_AMBULATORY_CARE_PROVIDER_SITE_OTHER): Payer: Medicare Other | Admitting: Adult Health

## 2021-03-31 VITALS — BP 127/78 | HR 78 | Ht 66.0 in | Wt 133.0 lb

## 2021-03-31 DIAGNOSIS — Z01818 Encounter for other preprocedural examination: Secondary | ICD-10-CM

## 2021-03-31 DIAGNOSIS — I639 Cerebral infarction, unspecified: Secondary | ICD-10-CM

## 2021-03-31 NOTE — Patient Instructions (Addendum)
Continue Eliquis (apixaban) daily  and atorvastatin 80mg  daily  for secondary stroke prevention  Continue to follow up with PCP regarding cholesterol and blood pressure management  Maintain strict control of hypertension with blood pressure goal below 130/90 and cholesterol with LDL cholesterol (bad cholesterol) goal below 70 mg/dL.   Cleared to undergo surgery with small but acceptable risk of stroke while off Eliquis - please stop Eliquis 2-3 days prior to procedure and restart immediately after or once cleared by surgeon         Thank you for coming to see [place] at [facility]. I hope we have been able to provide you high quality care today.  You may receive a patient satisfaction survey over the next few weeks. We would appreciate your feedback and comments so that we may continue to improve ourselves and the health of our patients.

## 2021-03-31 NOTE — Progress Notes (Signed)
[facility] [address] [place]. [place] [address] [phone]       OFFICE FOLLOW UP NOTE  Ms. [name] Date of Birth:  05-13-1951 Medical Record Number:  [id number]   Reason for visit: Surgical clearance    CHIEF COMPLAINT:  Chief Complaint  Patient presents with  . Follow-up    TR alone Pt is well, no complaints     HPI:   Today, 03/31/2021, Mr. Connie Meyer is being seen to obtain surgical clearance with prior stroke history.  She was previously seen in office over 1 year ago  Stable from stroke standpoint without new or recurring stroke/TIA symptoms She has remained on Eliquis for history of 2 embolic strokes (see below).  Loop recorder placed but has not shown atrial fibrillation thus far.  She has also remained on atorvastatin 40 mg daily tolerating without side effects.  Blood pressure today 127/78. She did have established PCP but since left the practice and is scheduled to establish care with Dr. Ronnald Ramp in June.  She is requesting clearance to undergo right rotator cuff repair currently scheduled on 04/05/2021 through [facility] Dr. Onnie Graham.   She has no concerns at this time     History provided for reference purposes only Update 03/23/2020 JM: [name] is here for follow-up regarding cryptogenic stroke.  She continues to be stable from a stroke standpoint without new or worsening stroke/TIA symptoms.  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  She continues on apixaban 5 mg twice daily without bleeding or bruising.  Continues on atorvastatin without myalgias.  Blood pressure today 152/76.  loop recorder is not shown atrial fibrillation thus far.  No further concerns at this time.  Update 11/24/2019 JM: [name] is a 70 year old female who is being seen today for stroke follow-up.  Since  prior visit, on 08/11/2019, she presented to ED with 2 transient episodes of confusion along with apraxia.  MRI showed small cortical and subcortical right parietal lobe infarcts new from prior imaging with cryptogenic etiology.  Loop recorder planned on being reimplanted as it was inadvertently expelled but had not done so prior to new strokes.  Loop recorder replaced during admission.  She was recently enrolled in the [place] trial but unfortunately met study endpoint due to new strokes.  Due to recent qualification with Remi Haggard trial and 2 embolic strokes, recommended initiating Eliquis.  HTN stable.  LDL 67 and continued on atorvastatin 40 mg daily.  Prior history of breast cancer in remission for 20 years and recommended outpatient follow-up with Dr. Pamella Pert outpatient for cancer recurrence screening with questionable hypercoagulable state with recurrent strokes.  Discharged home in stable condition. Since discharge, she has been doing well from a stroke standpoint without residual deficits or reoccurring of symptoms.  She was started on sertraline by PCP with improvement of depression but reports today that she self discontinued sertraline approximately 2 weeks ago due to feeling of flat affect and lack of emotion.  Since discontinuing, she has experienced slight worsening of depression/anxiety.  She was referred to therapy by PCP with evaluation scheduled but canceled visit as she felt depression stable.  She is interested at this time of pursuing therapy and plans on calling to schedule evaluation.  She also reports greater difficulty with short-term memory and delayed processing/recall.  She is also been having difficulty with insomnia currently on Ambien 5  mg nightly with mild benefit.  She also feels increased depression/anxiety related to current COVID-19 concerns and restrictions.  She also previously exercise frequently but has been fearful of exercising as she had reoccurring stroke after going for a  run.  She has continued on apixaban 5 mg twice daily without bleeding or bruising.  Continues on atorvastatin without myalgias.  Blood pressure today 140/84.  Loop recorder showed 10 new false AF episodes without true AF reported.  No further concerns at this time.  Hospital admission 06/13/2019: [name] is a 70 y.o. female with no significant past medical hx other than previous breast cancer presenting with transient visual changes in the setting of recently diagnosed hypertension. She did not receive IV t-PA due to late presentation (>4.5 hours from time of onset).  Stroke work-up revealed punctate acute/early subacute infarctions within the left occipital lobe embolic pattern secondary to unknown source with residual superior homonymous quadrantanopia.  MRI showed punctate acute/early subacute infarctions within the left occipital lobe.  TCD and carotid Dopplers unremarkable.  2D echo unremarkable without cardiac source is identified.  TEE no cardiac source of embolus or PFO therefore loop recorder placed to assess for atrial fibrillation.  LDL 107 A1c 5.4.  Recommended DAPT for 3 weeks and aspirin alone.  Initiated atorvastatin 80 mg daily for HTN management.  HTN stable.  Other stroke risk factors include advanced age and EtOH use but no prior history of stroke.  Discharged home in stable condition without residual deficits.  Initial visit 07/23/2019: No residual deficits - she does question if vision slightly worse such as film over eyes -she plans on scheduling visit with ophthalmology Completed 3 weeks DAPT with recommendation of continuing aspirin alone but discontinued both aspirin and Plavix as she did not have additional refills Continues on atorvastatin without myalgias Blood pressure 150/90 - at home typically 130s Loop recorder apparently fell out shortly after implanted.  Cardiology aware and plans on replacing soon. No further concerns at this time.     ROS:   14 system  review of systems performed and negative with exception of those listed in HPI  PMH:  Past Medical History:  Diagnosis Date  . Adjustment disorder with mixed anxiety and depressed mood 09/15/2019  . CVA (cerebral vascular accident) 08/11/2019   small cortical and subcortical right parietal lobe infarcts of cryptogenic etiology  . Dyslipidemia 08/12/2019  . History of bilateral mastectomy   . 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 No further therapy. Currently in remission.  [name] HTN (hypertension)   . Hypertension    Phreesia 06/21/2020  . Hypokalemia 06/14/2019  . Impingement syndrome of right shoulder 01/27/2020  . Insomnia 09/15/2019  . Stroke [facility])    Phreesia 06/21/2020    PSH:  Past Surgical History:  Procedure Laterality Date  . ABDOMINAL HYSTERECTOMY N/A    Phreesia 06/21/2020  . BREAST SURGERY    . BUBBLE STUDY  06/16/2019   Procedure: BUBBLE STUDY;  Surgeon: Fay Records, MD;  Location: [facility] ENDOSCOPY;  Service: Cardiovascular;;  . LOOP RECORDER INSERTION N/A 06/16/2019   Procedure: LOOP RECORDER INSERTION;  Surgeon: Evans Lance, MD;  Location: [place] CV LAB;  Service: Cardiovascular;  Laterality: N/A;  . LOOP RECORDER INSERTION N/A 08/12/2019   Procedure: LOOP RECORDER INSERTION;  Surgeon: Thompson Grayer, MD;  Location: [place] CV LAB;  Service: Cardiovascular;  Laterality: N/A;  . TEE WITHOUT CARDIOVERSION N/A 06/16/2019  Procedure: TRANSESOPHAGEAL ECHOCARDIOGRAM (TEE);  Surgeon: Fay Records, MD;  Location: [facility] ENDOSCOPY;  Service: Cardiovascular;  Laterality: N/A;    Social History:  Social History   Socioeconomic History  . Marital status: Divorced    Spouse name: Not on file  . Number of children: 4  . Years of education: 24  . Highest education level: Some college, no degree  Occupational History  . Occupation: Retired    Comment: Catering manager  Tobacco Use  . Smoking status: Never Smoker  . Smokeless  tobacco: Never Used  Vaping Use  . Vaping Use: Never used  Substance and Sexual Activity  . Alcohol use: Yes    Alcohol/week: 1.0 standard drink    Types: 1 Glasses of wine per week  . Drug use: No  . Sexual activity: Not Currently  Other Topics Concern  . Not on file  Social History Narrative  . Not on file   Social Determinants of Health   Financial Resource Strain: Not on file  Food Insecurity: Not on file  Transportation Needs: Not on file  Physical Activity: Not on file  Stress: Not on file  Social Connections: Not on file  Intimate Partner Violence: Not on file    Family History:  Family History  Problem Relation Age of Onset  . Hypertension Mother   . Hypertension Father     Medications:   Current Outpatient Medications on File Prior to Visit  Medication Sig Dispense Refill  . amLODipine (NORVASC) 10 MG tablet Take 1 tablet (10 mg total) by mouth daily. 90 tablet 3  . atorvastatin (LIPITOR) 40 MG tablet Take 1 tablet (40 mg total) by mouth daily at 6 PM. 90 tablet 3  . bimatoprost (LATISSE) 0.03 % ophthalmic solution 1 application daily.    . Doxepin HCl 3 MG TABS Take 1 tablet (3 mg total) by mouth at bedtime as needed (Insomnia). 60 tablet 2  . ELIQUIS 5 MG TABS tablet TAKE 1 TABLET(5 MG) BY MOUTH TWICE DAILY 60 tablet 0  . fluticasone (FLONASE) 50 MCG/ACT nasal spray SHAKE LIQUID AND USE 1 SPRAY IN EACH NOSTRIL TWICE DAILY 48 g 3   No current facility-administered medications on file prior to visit.    Allergies:  No Known Allergies   Physical Exam  Vitals:   03/31/21 1534  BP: 127/78  Pulse: 78  Weight: 133 lb (60.3 kg)  Height: 5' 6"  (1.676 m)   Body mass index is 21.47 kg/m. No exam data present   General: well developed, well nourished, very pleasant middle-age Caucasian female, seated, tearful during visit Head: head normocephalic and atraumatic.   Neck: supple with no carotid or supraclavicular bruits Cardiovascular: regular rate and  rhythm, no murmurs Musculoskeletal: no deformity Skin:  no rash/petichiae Vascular:  Normal pulses all extremities   Neurologic Exam Mental Status: Awake and fully alert.   Fluent speech and language.  Oriented to place and time. Recent and remote memory intact. Attention span, concentration and fund of knowledge appropriate during visit.  Mood and affect appropriate. Cranial Nerves: Pupils equal, briskly reactive to light. Extraocular movements full without nystagmus. Visual fields full to confrontation. Hearing intact. Facial sensation intact. Face, tongue, palate moves normally and symmetrically.  Motor: Normal bulk and tone. Normal strength in all tested extremity muscles. Sensory.: intact to touch , pinprick , position and vibratory sensation.  Coordination: Rapid alternating movements normal in all extremities. Finger-to-nose and heel-to-shin performed accurately bilaterally. Gait and Station: Arises from chair without difficulty.  Stance is normal. Gait demonstrates normal stride length and balance without use of assistive device Reflexes: 1+ and symmetric. Toes downgoing.       ASSESSMENT: Connie Meyer is a 70 y.o. year old female here with reoccurring cryptogenic strokes recently 08/11/2019 small cortical and subcortical infarcts in the right parietal lobe and punctate acute/early subacute infarctions within the left occipital lobe embolic pattern on 5/63/8756.  Loop recorder inadvertently expelled and had plans on following up with cardiology for replacement but not completed prior to August admission.  She was enrolled in [place] trial but due to reoccurring stroke, study endpoint met. Initiated Eliquis due to reoccurring strokes likely cardioembolic etiology.  Loop recorder re-implanted.  Vascular risk factors include HTN, HLD and prior EtOH use.  She continues to experience difficulty with poststroke depression/anxiety, worsening chronic insomnia and subjective short-term memory  impairment    PLAN:  1. Surgical clearance: Prior stroke 07/2019 without any additional strokes or stroke/TIA symptoms since that time.  She is cleared to undergo right shoulder rotator cuff repair currently scheduled on 04/05/2021.  Recommend holding Eliquis 2 to 3 days prior to procedure and restarting immediately after with small but acceptable preprocedure risk of stroke while off therapy.  She verbalizes understanding and wishes to proceed. Will complete clearance paperwork and fax back to [facility] 2. Cryptogenic stroke:  a. Stable without residual deficits b. Continue Eliquis (apixaban) daily  and continue atorvastatin 80 mg daily for secondary stroke prevention.  Qualified for Eliquis due to meeting criteria for Remi Haggard trial and reoccurring cortical strokes with undetermined etiology.   c. Loop recorder has not shown atrial fibrillation thus far d. Maintain strict control of hypertension with blood pressure goal below 130/90 and cholesterol with LDL cholesterol (bad cholesterol) goal below 70 mg/dL.  I also advised the patient to eat a healthy diet with plenty of whole grains, cereals, fruits and vegetables, exercise regularly with at least 30 minutes of continuous activity daily and maintain ideal body weight.     Follow-up on an as-needed basis   CC:  [facility] provider: Dr. Lenda Kelp, Arvid Right, MD   Justice Britain, MD (ortho)  I spent 20 minutes of face-to-face and non-face-to-face time with patient.  This included previsit chart review, lab review, study review, order entry, electronic health record documentation, patient education and discussion regarding surgical clearance with stroke history, duration of holding AC and stroke risk, history of prior cryptogenic strokes and importance of managing stroke risk factors and answered all other questions to patient satisfaction    Frann Rider, AGNP-BC  [facility] [address] [place], [place]  [address]  Phone [phone] Fax [phone] Note: This document was prepared with digital dictation and possible smart phrase technology. Any transcriptional errors that result from this process are unintentional.

## 2021-04-03 NOTE — Progress Notes (Signed)
I agree with the above plan 

## 2021-04-04 NOTE — Progress Notes (Signed)
Carelink Summary Report / Loop Recorder 

## 2021-04-05 ENCOUNTER — Ambulatory Visit: Payer: Medicare Other | Admitting: Rehabilitative and Restorative Service Providers"

## 2021-04-05 DIAGNOSIS — S43491A Other sprain of right shoulder joint, initial encounter: Secondary | ICD-10-CM | POA: Diagnosis not present

## 2021-04-05 DIAGNOSIS — M65811 Other synovitis and tenosynovitis, right shoulder: Secondary | ICD-10-CM | POA: Diagnosis not present

## 2021-04-05 DIAGNOSIS — X58XXXA Exposure to other specified factors, initial encounter: Secondary | ICD-10-CM | POA: Diagnosis not present

## 2021-04-05 DIAGNOSIS — S46011A Strain of muscle(s) and tendon(s) of the rotator cuff of right shoulder, initial encounter: Secondary | ICD-10-CM | POA: Diagnosis not present

## 2021-04-05 DIAGNOSIS — M659 Synovitis and tenosynovitis, unspecified: Secondary | ICD-10-CM | POA: Diagnosis not present

## 2021-04-05 DIAGNOSIS — S46211A Strain of muscle, fascia and tendon of other parts of biceps, right arm, initial encounter: Secondary | ICD-10-CM | POA: Diagnosis not present

## 2021-04-05 DIAGNOSIS — M7541 Impingement syndrome of right shoulder: Secondary | ICD-10-CM | POA: Diagnosis not present

## 2021-04-05 DIAGNOSIS — M75121 Complete rotator cuff tear or rupture of right shoulder, not specified as traumatic: Secondary | ICD-10-CM | POA: Diagnosis not present

## 2021-04-05 DIAGNOSIS — G8918 Other acute postprocedural pain: Secondary | ICD-10-CM | POA: Diagnosis not present

## 2021-04-05 DIAGNOSIS — Y999 Unspecified external cause status: Secondary | ICD-10-CM | POA: Diagnosis not present

## 2021-04-05 DIAGNOSIS — M19011 Primary osteoarthritis, right shoulder: Secondary | ICD-10-CM | POA: Diagnosis not present

## 2021-04-05 DIAGNOSIS — M7521 Bicipital tendinitis, right shoulder: Secondary | ICD-10-CM | POA: Diagnosis not present

## 2021-04-14 ENCOUNTER — Other Ambulatory Visit: Payer: Self-pay | Admitting: Family Medicine

## 2021-04-15 DIAGNOSIS — M25511 Pain in right shoulder: Secondary | ICD-10-CM | POA: Diagnosis not present

## 2021-04-21 DIAGNOSIS — M25511 Pain in right shoulder: Secondary | ICD-10-CM | POA: Diagnosis not present

## 2021-04-25 ENCOUNTER — Ambulatory Visit (INDEPENDENT_AMBULATORY_CARE_PROVIDER_SITE_OTHER): Payer: Medicare Other

## 2021-04-25 DIAGNOSIS — I6300 Cerebral infarction due to thrombosis of unspecified precerebral artery: Secondary | ICD-10-CM | POA: Diagnosis not present

## 2021-04-25 LAB — CUP PACEART REMOTE DEVICE CHECK
Date Time Interrogation Session: 20220427230445
Implantable Pulse Generator Implant Date: 20200818

## 2021-04-27 DIAGNOSIS — M25511 Pain in right shoulder: Secondary | ICD-10-CM | POA: Diagnosis not present

## 2021-05-05 DIAGNOSIS — M25511 Pain in right shoulder: Secondary | ICD-10-CM | POA: Diagnosis not present

## 2021-05-12 DIAGNOSIS — M25511 Pain in right shoulder: Secondary | ICD-10-CM | POA: Diagnosis not present

## 2021-05-17 NOTE — Progress Notes (Signed)
Carelink Summary Report / Loop Recorder 

## 2021-05-26 DIAGNOSIS — M25511 Pain in right shoulder: Secondary | ICD-10-CM | POA: Diagnosis not present

## 2021-05-30 ENCOUNTER — Ambulatory Visit (INDEPENDENT_AMBULATORY_CARE_PROVIDER_SITE_OTHER): Payer: Medicare Other

## 2021-05-30 DIAGNOSIS — I6300 Cerebral infarction due to thrombosis of unspecified precerebral artery: Secondary | ICD-10-CM | POA: Diagnosis not present

## 2021-05-30 LAB — CUP PACEART REMOTE DEVICE CHECK
Date Time Interrogation Session: 20220530231043
Implantable Pulse Generator Implant Date: 20200818

## 2021-06-02 DIAGNOSIS — M25511 Pain in right shoulder: Secondary | ICD-10-CM | POA: Diagnosis not present

## 2021-06-06 ENCOUNTER — Ambulatory Visit (INDEPENDENT_AMBULATORY_CARE_PROVIDER_SITE_OTHER): Payer: Medicare Other | Admitting: Internal Medicine

## 2021-06-06 ENCOUNTER — Other Ambulatory Visit: Payer: Self-pay

## 2021-06-06 ENCOUNTER — Encounter: Payer: Self-pay | Admitting: Internal Medicine

## 2021-06-06 VITALS — BP 136/80 | HR 90 | Temp 98.2°F | Resp 16 | Ht 66.0 in | Wt 129.0 lb

## 2021-06-06 DIAGNOSIS — E876 Hypokalemia: Secondary | ICD-10-CM | POA: Diagnosis not present

## 2021-06-06 DIAGNOSIS — E785 Hyperlipidemia, unspecified: Secondary | ICD-10-CM | POA: Diagnosis not present

## 2021-06-06 DIAGNOSIS — I639 Cerebral infarction, unspecified: Secondary | ICD-10-CM | POA: Diagnosis not present

## 2021-06-06 DIAGNOSIS — M19011 Primary osteoarthritis, right shoulder: Secondary | ICD-10-CM | POA: Insufficient documentation

## 2021-06-06 DIAGNOSIS — F5104 Psychophysiologic insomnia: Secondary | ICD-10-CM

## 2021-06-06 DIAGNOSIS — Z23 Encounter for immunization: Secondary | ICD-10-CM

## 2021-06-06 DIAGNOSIS — I10 Essential (primary) hypertension: Secondary | ICD-10-CM | POA: Diagnosis not present

## 2021-06-06 DIAGNOSIS — R739 Hyperglycemia, unspecified: Secondary | ICD-10-CM | POA: Insufficient documentation

## 2021-06-06 LAB — BASIC METABOLIC PANEL
BUN: 17 mg/dL (ref 6–23)
CO2: 30 mEq/L (ref 19–32)
Calcium: 9.6 mg/dL (ref 8.4–10.5)
Chloride: 104 mEq/L (ref 96–112)
Creatinine, Ser: 0.79 mg/dL (ref 0.40–1.20)
GFR: 75.67 mL/min (ref >60.00)
Glucose, Bld: 92 mg/dL (ref 70–99)
Potassium: 4 mEq/L (ref 3.5–5.1)
Sodium: 143 mEq/L (ref 135–145)

## 2021-06-06 LAB — LIPID PANEL
Cholesterol: 183 mg/dL (ref 0–200)
HDL: 62.1 mg/dL (ref >39.00)
LDL Cholesterol: 99 mg/dL (ref 0–99)
NonHDL: 121.01
Total CHOL/HDL Ratio: 3
Triglycerides: 108 mg/dL (ref 0.0–149.0)
VLDL: 21.6 mg/dL (ref 0.0–40.0)

## 2021-06-06 LAB — MAGNESIUM: Magnesium: 2.2 mg/dL (ref 1.5–2.5)

## 2021-06-06 LAB — HEMOGLOBIN A1C: Hgb A1c MFr Bld: 5.9 % (ref 4.6–6.5)

## 2021-06-06 LAB — TSH: TSH: 2.78 u[IU]/mL (ref 0.35–4.50)

## 2021-06-06 MED ORDER — TRAMADOL HCL 50 MG PO TABS: 50.0000 mg | ORAL_TABLET | Freq: Four times a day (QID) | ORAL | 1 refills | Status: DC | PRN

## 2021-06-06 MED ORDER — ZOLPIDEM TARTRATE ER 6.25 MG PO TBCR: 6.2500 mg | EXTENDED_RELEASE_TABLET | Freq: Every evening | ORAL | 1 refills | Status: DC | PRN

## 2021-06-06 MED ORDER — SHINGRIX 50 MCG/0.5ML IM SUSR: 0.5000 mL | Freq: Once | INTRAMUSCULAR | 1 refills | Status: AC

## 2021-06-06 NOTE — Progress Notes (Signed)
Subjective:  Patient ID: Connie Meyer, female    DOB: November 10, 1951  Age: 70 y.o. MRN: [id number]  CC: Hypertension and Hyperlipidemia  This visit occurred during the SARS-CoV-2 public health emergency.  Safety protocols were in place, including screening questions prior to the visit, additional usage of staff PPE, and extensive cleaning of exam room while observing appropriate contact time as indicated for disinfecting solutions.    HPI Connie Meyer presents for f/up and to establish.  She has had 3 small CVAs over the last 2 years.  She has an implanted loop recorder.  She is anticoagulated with a DOAC.  She recently had right shoulder surgery for osteoarthritis.  She continues to complain of right shoulder pain that interferes with her sleep and daily activity.  She does not get much symptom relief with Tylenol and NSAIDs are contraindicated.  She also complains of chronic insomnia and tells me that doxepin has not helped.  She has a history of hypertension and tells me her blood pressure has been well controlled.  She is active and denies any recent episodes of dizziness, lightheadedness, palpitations, chest pain, shortness of breath, or diaphoresis.  Outpatient Medications Prior to Visit  Medication Sig Dispense Refill   amLODipine (NORVASC) 10 MG tablet Take 1 tablet (10 mg total) by mouth daily. 90 tablet 3   atorvastatin (LIPITOR) 40 MG tablet Take 1 tablet (40 mg total) by mouth daily at 6 PM. 90 tablet 3   bimatoprost (LATISSE) 0.03 % ophthalmic solution 1 application daily.     ELIQUIS 5 MG TABS tablet TAKE 1 TABLET(5 MG) BY MOUTH TWICE DAILY 60 tablet 0   fluticasone (FLONASE) 50 MCG/ACT nasal spray SHAKE LIQUID AND USE 1 SPRAY IN EACH NOSTRIL TWICE DAILY 48 g 3   Doxepin HCl 3 MG TABS Take 1 tablet (3 mg total) by mouth at bedtime as needed (Insomnia). 60 tablet 2   No facility-administered medications prior to visit.    ROS Review of Systems  Constitutional:   Negative for chills, diaphoresis and fatigue.  HENT: Negative.    Eyes: Negative.   Respiratory:  Negative for cough, chest tightness, shortness of breath and wheezing.   Cardiovascular:  Negative for chest pain, palpitations and leg swelling.  Gastrointestinal:  Negative for abdominal pain, constipation, diarrhea, nausea and vomiting.  Endocrine: Negative.   Genitourinary: Negative.  Negative for difficulty urinating and dysuria.  Musculoskeletal:  Positive for arthralgias. Negative for back pain and myalgias.  Neurological: Negative.  Negative for dizziness, weakness, light-headedness and headaches.  Hematological:  Negative for adenopathy. Does not bruise/bleed easily.  Psychiatric/Behavioral: Negative.     Objective:  BP 136/80 (BP Location: Left Arm, Patient Position: Sitting, Cuff Size: Normal)   Pulse 90   Temp 98.2 F (36.8 C) (Oral)   Resp 16   Ht 5\' 6"  (1.676 m)   Wt 129 lb (58.5 kg)   SpO2 95%   BMI 20.82 kg/m   BP Readings from Last 3 Encounters:  06/06/21 136/80  03/31/21 127/78  03/14/21 131/74    Wt Readings from Last 3 Encounters:  06/06/21 129 lb (58.5 kg)  03/31/21 133 lb (60.3 kg)  03/22/21 130 lb (59 kg)    Physical Exam Vitals reviewed.  Constitutional:      Appearance: Normal appearance.  HENT:     Nose: Nose normal.     Mouth/Throat:     Mouth: Mucous membranes are moist.  Eyes:     General: No scleral icterus.  Conjunctiva/sclera: Conjunctivae normal.  Cardiovascular:     Rate and Rhythm: Normal rate and regular rhythm.     Heart sounds: No murmur heard. Pulmonary:     Effort: Pulmonary effort is normal.     Breath sounds: No stridor. No wheezing, rhonchi or rales.  Abdominal:     General: Abdomen is flat. Bowel sounds are normal. There is no distension.     Palpations: Abdomen is soft. There is no hepatomegaly, splenomegaly or mass.     Tenderness: There is no abdominal tenderness.  Musculoskeletal:        General: Normal range of  motion.     Cervical back: Neck supple.     Right lower leg: No edema.     Left lower leg: No edema.  Skin:    General: Skin is warm and dry.  Neurological:     General: No focal deficit present.     Mental Status: She is alert and oriented to person, place, and time.  Psychiatric:        Mood and Affect: Mood normal.        Behavior: Behavior normal.        Thought Content: Thought content normal.        Judgment: Judgment normal.    Lab Results  Component Value Date   WBC 4.7 03/14/2021   HGB 13.4 03/14/2021   HCT 40.8 03/14/2021   PLT 244 03/14/2021   GLUCOSE 92 06/06/2021   CHOL 183 06/06/2021   TRIG 108.0 06/06/2021   HDL 62.10 06/06/2021   LDLCALC 99 06/06/2021   ALT 18 07/16/2020   AST 20 07/16/2020   NA 143 06/06/2021   K 4.0 06/06/2021   CL 104 06/06/2021   CREATININE 0.79 06/06/2021   BUN 17 06/06/2021   CO2 30 06/06/2021   TSH 2.78 06/06/2021   INR 1.0 08/11/2019   HGBA1C 5.9 06/06/2021    DG Chest 2 View  Result Date: 03/14/2021 CLINICAL DATA:  Shortness of breath. EXAM: CHEST - 2 VIEW COMPARISON:  Chest x-ray dated July 15, 2019. FINDINGS: The heart size and mediastinal contours are within normal limits. New loop recorder over the lower midline chest. Normal pulmonary vascularity. Unchanged biapical pleuroparenchymal scarring. No focal consolidation, pleural effusion, or pneumothorax. No acute osseous abnormality. Unchanged surgical clips in both axilla. IMPRESSION: 1. No acute cardiopulmonary disease. Electronically Signed   By: Titus Dubin M.D.   On: 03/14/2021 14:16    Assessment & Plan:   Connie Meyer was seen today for hypertension and hyperlipidemia.  Diagnoses and all orders for this visit:  Primary hypertension- Her blood pressure is adequately well controlled. -     Basic metabolic panel; Future -     Basic metabolic panel  Hypokalemia- Her potassium level is normal now. -     Basic metabolic panel; Future -     Magnesium; Future -      Magnesium -     Basic metabolic panel  Hyperlipidemia with target LDL less than 100- LDL goal achieved. Doing well on the statin  -     Lipid panel; Future -     TSH; Future -     TSH -     Lipid panel  Chronic hyperglycemia- Her blood sugar is normal now. -     Basic metabolic panel; Future -     Hemoglobin A1c; Future -     Hemoglobin A1c -     Basic metabolic panel  Primary osteoarthritis of right  shoulder -     traMADol (ULTRAM) 50 MG tablet; Take 1 tablet (50 mg total) by mouth every 6 (six) hours as needed.  Psychophysiological insomnia -     zolpidem (AMBIEN CR) 6.25 MG CR tablet; Take 1 tablet (6.25 mg total) by mouth at bedtime as needed for sleep.  Need for shingles vaccine -     Zoster Vaccine Adjuvanted [facility]) injection; Inject 0.5 mLs into the muscle once for 1 dose.  Other orders -     Tdap vaccine greater than or equal to 7yo IM -     Pneumococcal polysaccharide vaccine 23-valent greater than or equal to 2yo subcutaneous/IM  I have discontinued Winnell K. Mixon's Doxepin HCl. I am also having her start on zolpidem, traMADol, and Shingrix. Additionally, I am having her maintain her bimatoprost, atorvastatin, fluticasone, amLODipine, and Eliquis.  Meds ordered this encounter  Medications   zolpidem (AMBIEN CR) 6.25 MG CR tablet    Sig: Take 1 tablet (6.25 mg total) by mouth at bedtime as needed for sleep.    Dispense:  90 tablet    Refill:  1   traMADol (ULTRAM) 50 MG tablet    Sig: Take 1 tablet (50 mg total) by mouth every 6 (six) hours as needed.    Dispense:  270 tablet    Refill:  1   Zoster Vaccine Adjuvanted [facility]) injection    Sig: Inject 0.5 mLs into the muscle once for 1 dose.    Dispense:  0.5 mL    Refill:  1      Follow-up: Return in about 6 months (around 12/06/2021).  Scarlette Calico, MD

## 2021-06-07 ENCOUNTER — Encounter: Payer: Self-pay | Admitting: Internal Medicine

## 2021-06-07 NOTE — Patient Instructions (Signed)

## 2021-06-08 DIAGNOSIS — M25511 Pain in right shoulder: Secondary | ICD-10-CM | POA: Diagnosis not present

## 2021-06-10 DIAGNOSIS — M25511 Pain in right shoulder: Secondary | ICD-10-CM | POA: Diagnosis not present

## 2021-06-21 NOTE — Progress Notes (Signed)
Carelink Summary Report / Loop Recorder 

## 2021-06-28 DIAGNOSIS — M25511 Pain in right shoulder: Secondary | ICD-10-CM | POA: Diagnosis not present

## 2021-07-04 ENCOUNTER — Ambulatory Visit (INDEPENDENT_AMBULATORY_CARE_PROVIDER_SITE_OTHER): Payer: Medicare Other

## 2021-07-04 DIAGNOSIS — I6300 Cerebral infarction due to thrombosis of unspecified precerebral artery: Secondary | ICD-10-CM | POA: Diagnosis not present

## 2021-07-04 LAB — CUP PACEART REMOTE DEVICE CHECK
Date Time Interrogation Session: 20220702230530
Implantable Pulse Generator Implant Date: 20200818

## 2021-07-06 DIAGNOSIS — M25511 Pain in right shoulder: Secondary | ICD-10-CM | POA: Diagnosis not present

## 2021-07-08 DIAGNOSIS — Z20822 Contact with and (suspected) exposure to covid-19: Secondary | ICD-10-CM | POA: Diagnosis not present

## 2021-07-11 DIAGNOSIS — M25511 Pain in right shoulder: Secondary | ICD-10-CM | POA: Diagnosis not present

## 2021-07-13 ENCOUNTER — Telehealth: Payer: Self-pay | Admitting: Internal Medicine

## 2021-07-13 NOTE — Telephone Encounter (Signed)
Patient requesting refill for ELIQUIS 5 MG TABS tablet   [id number] - [place], [place]

## 2021-07-18 DIAGNOSIS — M25511 Pain in right shoulder: Secondary | ICD-10-CM | POA: Diagnosis not present

## 2021-07-19 ENCOUNTER — Other Ambulatory Visit: Payer: Self-pay

## 2021-07-19 ENCOUNTER — Ambulatory Visit (INDEPENDENT_AMBULATORY_CARE_PROVIDER_SITE_OTHER): Payer: Medicare Other | Admitting: Internal Medicine

## 2021-07-19 ENCOUNTER — Encounter: Payer: Self-pay | Admitting: Internal Medicine

## 2021-07-19 ENCOUNTER — Other Ambulatory Visit: Payer: Self-pay | Admitting: Internal Medicine

## 2021-07-19 DIAGNOSIS — R1032 Left lower quadrant pain: Secondary | ICD-10-CM | POA: Insufficient documentation

## 2021-07-19 DIAGNOSIS — K5792 Diverticulitis of intestine, part unspecified, without perforation or abscess without bleeding: Secondary | ICD-10-CM | POA: Insufficient documentation

## 2021-07-19 DIAGNOSIS — I639 Cerebral infarction, unspecified: Secondary | ICD-10-CM | POA: Diagnosis not present

## 2021-07-19 DIAGNOSIS — I10 Essential (primary) hypertension: Secondary | ICD-10-CM | POA: Diagnosis not present

## 2021-07-19 LAB — COMPREHENSIVE METABOLIC PANEL
ALT: 11 U/L (ref 0–35)
AST: 15 U/L (ref 0–37)
Albumin: 4.4 g/dL (ref 3.5–5.2)
Alkaline Phosphatase: 75 U/L (ref 39–117)
BUN: 18 mg/dL (ref 6–23)
CO2: 28 mEq/L (ref 19–32)
Calcium: 9.7 mg/dL (ref 8.4–10.5)
Chloride: 104 mEq/L (ref 96–112)
Creatinine, Ser: 0.82 mg/dL (ref 0.40–1.20)
GFR: 72.3 mL/min (ref >60.00)
Glucose, Bld: 96 mg/dL (ref 70–99)
Potassium: 4.8 mEq/L (ref 3.5–5.1)
Sodium: 140 mEq/L (ref 135–145)
Total Bilirubin: 0.6 mg/dL (ref 0.2–1.2)
Total Protein: 7.6 g/dL (ref 6.0–8.3)

## 2021-07-19 LAB — CBC WITH DIFFERENTIAL/PLATELET
Basophils Absolute: 0 10*3/uL (ref 0.0–0.1)
Basophils Relative: 0.4 % (ref 0.0–3.0)
Eosinophils Absolute: 0.2 10*3/uL (ref 0.0–0.7)
Eosinophils Relative: 3.8 % (ref 0.0–5.0)
HCT: 39 % (ref 36.0–46.0)
Hemoglobin: 12.8 g/dL (ref 12.0–15.0)
Lymphocytes Relative: 23.7 % (ref 12.0–46.0)
Lymphs Abs: 1.1 10*3/uL (ref 0.7–4.0)
MCHC: 32.9 g/dL (ref 30.0–36.0)
MCV: 96.2 fl (ref 78.0–100.0)
Monocytes Absolute: 0.4 10*3/uL (ref 0.1–1.0)
Monocytes Relative: 8.1 % (ref 3.0–12.0)
Neutro Abs: 3.1 10*3/uL (ref 1.4–7.7)
Neutrophils Relative %: 64 % (ref 43.0–77.0)
Platelets: 201 10*3/uL (ref 150.0–400.0)
RBC: 4.05 Mil/uL (ref 3.87–5.11)
RDW: 13.2 % (ref 11.5–15.5)
WBC: 4.8 10*3/uL (ref 4.0–10.5)

## 2021-07-19 LAB — SEDIMENTATION RATE: Sed Rate: 9 mm/hr (ref 0–30)

## 2021-07-19 MED ORDER — KETOROLAC TROMETHAMINE 30 MG/ML IJ SOLN: 30.0000 mg | Freq: Once | INTRAMUSCULAR | Status: DC

## 2021-07-19 MED ORDER — CIPROFLOXACIN HCL 500 MG PO TABS: 500.0000 mg | ORAL_TABLET | Freq: Two times a day (BID) | ORAL | 0 refills | Status: AC

## 2021-07-19 MED ORDER — KETOROLAC TROMETHAMINE 30 MG/ML IJ SOLN
30.0000 mg | Freq: Once | INTRAMUSCULAR | Status: AC
  Administered 2021-07-19: 30 mg via INTRAMUSCULAR

## 2021-07-19 NOTE — Telephone Encounter (Signed)
Pt is here for ov w/ Dr. Camila Li. She states she called week ago for refill on her Eliquis and the pharmacy has not received any response. She stated med was previously rx by a physician that she no longer see, and she is almost out. Requesting rx to be sent [facility].[name] KitchenChryl Heck

## 2021-07-19 NOTE — Assessment & Plan Note (Addendum)
Differential diagnosis broad including renal colic, pyelonephritis, diverticulitis.  Diverticulitis seems to be the most likely diagnosis.  The patient has diverticulosis on previous colonoscopy.  Will start on Cipro po.  She will take a probiotic.  Low residue diet.  Obtain UA, CBC, chemistry, sed rate. The patient declined abdominal CT scan. Follow-up with Dr. Ronnald Ramp in 1 week.  Call if problems. The patient will use tramadol from her old supply.

## 2021-07-19 NOTE — Assessment & Plan Note (Signed)
Blood pressure is normal today.  The patient is on amlodipine

## 2021-07-19 NOTE — Patient Instructions (Signed)
Diverticulitis  Diverticulitis is when small pouches in your colon (large intestine) get infected or swollen. This causes pain in the belly (abdomen) and watery poop (diarrhea). These pouches are called diverticula. The pouches form in people who have acondition called diverticulosis. What are the causes? This condition may be caused by poop (stool) that gets trapped in the pouches in your colon. The poop lets germs (bacteria) grow in the pouches. This causes the infection. What increases the risk? You are more likely to get this condition if you have small pouches in your colon. The risk is higher if: You are overweight or very overweight (obese). You do not exercise enough. You drink alcohol. You smoke or use products with tobacco in them. You eat a diet that has a lot of red meat such as beef, pork, or lamb. You eat a diet that does not have enough fiber in it. You are older than 71 years of age. What are the signs or symptoms? Pain in the belly. Pain is often on the left side, but it may be in other areas. Fever and feeling cold. Feeling like you may vomit. Vomiting. Having cramps. Feeling full. Changes to how often you poop. Blood in your poop. How is this treated? Most cases are treated at home by: Taking over-the-counter pain medicines. Following a clear liquid diet. Taking antibiotic medicines. Resting. Very bad cases may need to be treated at a hospital. This may include: Not eating or drinking. Taking prescription pain medicine. Getting antibiotic medicines through an IV tube. Getting fluid and food through an IV tube. Having surgery. When you are feeling better, your doctor may tell you to have a test to check your colon (colonoscopy). Follow these instructions at home: Medicines Take over-the-counter and prescription medicines only as told by your doctor. These include: Antibiotics. Pain medicines. Fiber pills. Probiotics. Stool softeners. If you were  prescribed an antibiotic medicine, take it as told by your doctor. Do not stop taking the antibiotic even if you start to feel better. Ask your doctor if the medicine prescribed to you requires you to avoid driving or using machinery. Eating and drinking  Follow a diet as told by your doctor. When you feel better, your doctor may tell you to change your diet. You may need to eat a lot of fiber. Fiber makes it easier to poop (have a bowel movement). Foods with fiber include: Berries. Beans. Lentils. Green vegetables. Avoid eating red meat.  General instructions Do not use any products that contain nicotine or tobacco, such as cigarettes, e-cigarettes, and chewing tobacco. If you need help quitting, ask your doctor. Exercise 3 or more times a week. Try to get 30 minutes each time. Exercise enough to sweat and make your heart beat faster. Keep all follow-up visits as told by your doctor. This is important. Contact a doctor if: Your pain does not get better. You are not pooping like normal. Get help right away if: Your pain gets worse. Your symptoms do not get better. Your symptoms get worse very fast. You have a fever. You vomit more than one time. You have poop that is: Bloody. Black. Tarry. Summary This condition happens when small pouches in your colon get infected or swollen. Take medicines only as told by your doctor. Follow a diet as told by your doctor. Keep all follow-up visits as told by your doctor. This is important. This information is not intended to replace advice given to you by your health care provider. Make sure  you discuss any questions you have with your healthcare provider. Document Revised: 09/22/2019 Document Reviewed: 09/22/2019 Elsevier Patient Education  2022 [employer].

## 2021-07-19 NOTE — Progress Notes (Signed)
Subjective:  Patient ID: Connie Meyer, female    DOB: [birth year]  Age: 70 y.o. MRN: [id number]  CC: Back Pain (Pt states pain is more on the (L) side.. left quadrant area. Fill like UTI sxs but denies any other symptoms- Not abe to give specimen )   HPI Connie Meyer presents for severe pain in the LLQ, L flank pain x 3 wks. No urinary sx's. H/o UTI -remote. The patient had a colonoscopy w/diverticulosis in 2017. There is no fever, nausea, vomiting, dysuria, blood in the urine, blood in the stool.  She has been feeling pretty bad due to pain.  The pain is at 5 out of 10.  Its been getting worse.  Outpatient Medications Prior to Visit  Medication Sig Dispense Refill   amLODipine (NORVASC) 10 MG tablet Take 1 tablet (10 mg total) by mouth daily. 90 tablet 3   atorvastatin (LIPITOR) 40 MG tablet Take 1 tablet (40 mg total) by mouth daily at 6 PM. 90 tablet 3   bimatoprost (LATISSE) 0.03 % ophthalmic solution 1 application daily.     ELIQUIS 5 MG TABS tablet TAKE 1 TABLET(5 MG) BY MOUTH TWICE DAILY 60 tablet 0   fluticasone (FLONASE) 50 MCG/ACT nasal spray SHAKE LIQUID AND USE 1 SPRAY IN EACH NOSTRIL TWICE DAILY 48 g 3   traMADol (ULTRAM) 50 MG tablet Take 1 tablet (50 mg total) by mouth every 6 (six) hours as needed. 270 tablet 1   zolpidem (AMBIEN CR) 6.25 MG CR tablet Take 1 tablet (6.25 mg total) by mouth at bedtime as needed for sleep. 90 tablet 1   No facility-administered medications prior to visit.    ROS: Review of Systems  Constitutional:  Negative for activity change, appetite change, chills, fatigue and unexpected weight change.  HENT:  Negative for congestion, mouth sores and sinus pressure.   Eyes:  Negative for visual disturbance.  Respiratory:  Negative for cough and chest tightness.   Gastrointestinal:  Positive for abdominal pain. Negative for nausea.  Genitourinary:  Negative for difficulty urinating, frequency and vaginal pain.  Musculoskeletal:   Positive for arthralgias. Negative for back pain and gait problem.  Skin:  Negative for pallor and rash.  Neurological:  Negative for dizziness, tremors, weakness, numbness and headaches.  Psychiatric/Behavioral:  Negative for confusion and sleep disturbance.    Objective:  BP 124/72 (BP Location: Left Arm)   Pulse 70   Temp 98.4 F (36.9 C) (Oral)   Ht '5\' 6"'$  (1.676 m)   Wt 131 lb 9.6 oz (59.7 kg)   SpO2 99%   BMI 21.24 kg/m   BP Readings from Last 3 Encounters:  07/19/21 124/72  06/06/21 136/80  03/31/21 127/78    Wt Readings from Last 3 Encounters:  07/19/21 131 lb 9.6 oz (59.7 kg)  06/06/21 129 lb (58.5 kg)  03/31/21 133 lb (60.3 kg)    Physical Exam Constitutional:      General: She is not in acute distress.    Appearance: She is well-developed. She is not ill-appearing or toxic-appearing.  HENT:     Head: Normocephalic.     Right Ear: External ear normal.     Left Ear: External ear normal.     Nose: Nose normal.  Eyes:     General:        Right eye: No discharge.        Left eye: No discharge.     Conjunctiva/sclera: Conjunctivae normal.     Pupils: Pupils  are equal, round, and reactive to light.  Neck:     Thyroid: No thyromegaly.     Vascular: No JVD.     Trachea: No tracheal deviation.  Cardiovascular:     Rate and Rhythm: Normal rate and regular rhythm.     Heart sounds: Normal heart sounds.  Pulmonary:     Effort: No respiratory distress.     Breath sounds: No stridor. No wheezing.  Abdominal:     General: Bowel sounds are normal. There is no distension.     Palpations: Abdomen is soft. There is no mass.     Tenderness: There is abdominal tenderness. There is no right CVA tenderness, left CVA tenderness, guarding or rebound.  Musculoskeletal:        General: No tenderness.     Cervical back: Normal range of motion and neck supple. No rigidity.  Lymphadenopathy:     Cervical: No cervical adenopathy.  Skin:    Findings: No erythema or rash.   Neurological:     Mental Status: She is oriented to person, place, and time.     Cranial Nerves: No cranial nerve deficit.     Motor: No abnormal muscle tone.     Coordination: Coordination normal.     Deep Tendon Reflexes: Reflexes normal.  Psychiatric:        Behavior: Behavior normal.        Thought Content: Thought content normal.        Judgment: Judgment normal.  Left lower quadrant is tender, no mass.  No rebound symptoms  Lab Results  Component Value Date   WBC 4.7 03/14/2021   HGB 13.4 03/14/2021   HCT 40.8 03/14/2021   PLT 244 03/14/2021   GLUCOSE 92 06/06/2021   CHOL 183 06/06/2021   TRIG 108.0 06/06/2021   HDL 62.10 06/06/2021   LDLCALC 99 06/06/2021   ALT 18 07/16/2020   AST 20 07/16/2020   NA 143 06/06/2021   K 4.0 06/06/2021   CL 104 06/06/2021   CREATININE 0.79 06/06/2021   BUN 17 06/06/2021   CO2 30 06/06/2021   TSH 2.78 06/06/2021   INR 1.0 08/11/2019   HGBA1C 5.9 06/06/2021    DG Chest 2 View  Result Date: 03/14/2021 CLINICAL DATA:  Shortness of breath. EXAM: CHEST - 2 VIEW COMPARISON:  Chest x-ray dated July 15, 2019. FINDINGS: The heart size and mediastinal contours are within normal limits. New loop recorder over the lower midline chest. Normal pulmonary vascularity. Unchanged biapical pleuroparenchymal scarring. No focal consolidation, pleural effusion, or pneumothorax. No acute osseous abnormality. Unchanged surgical clips in both axilla. IMPRESSION: 1. No acute cardiopulmonary disease. Electronically Signed   By: Titus Dubin M.D.   On: 03/14/2021 14:16    Assessment & Plan:    Walker Kehr, MD

## 2021-07-19 NOTE — Assessment & Plan Note (Addendum)
It seems to be the most likely diagnosis.  The patient has diverticulosis on previous colonoscopy.  Will start on Cipro po.  She will take a probiotic.  Low residue diet.  Obtain UA, CBC, chemistry, sed rate. The patient declined abdominal CT scan. Follow-up with Dr. Ronnald Ramp in 1 week.  Call if problems.

## 2021-07-20 ENCOUNTER — Other Ambulatory Visit: Payer: Self-pay | Admitting: Internal Medicine

## 2021-07-20 DIAGNOSIS — I6300 Cerebral infarction due to thrombosis of unspecified precerebral artery: Secondary | ICD-10-CM

## 2021-07-20 MED ORDER — APIXABAN 5 MG PO TABS: ORAL_TABLET | ORAL | 0 refills | Status: DC

## 2021-07-20 NOTE — Telephone Encounter (Signed)
Pt stated that she was prescribed Eliquis due to history of stroke. She stated that she is running low and only taking one a day when she should be taking 2 a day. She would like a refill sent in today if possible. Please advise.

## 2021-07-25 DIAGNOSIS — M25511 Pain in right shoulder: Secondary | ICD-10-CM | POA: Diagnosis not present

## 2021-07-26 NOTE — Progress Notes (Signed)
Carelink Summary Report / Loop Recorder 

## 2021-07-27 ENCOUNTER — Ambulatory Visit (INDEPENDENT_AMBULATORY_CARE_PROVIDER_SITE_OTHER): Payer: Medicare Other | Admitting: Internal Medicine

## 2021-07-27 ENCOUNTER — Encounter: Payer: Self-pay | Admitting: Internal Medicine

## 2021-07-27 ENCOUNTER — Other Ambulatory Visit: Payer: Self-pay

## 2021-07-27 VITALS — BP 116/76 | HR 89 | Temp 98.1°F | Resp 16 | Ht 66.0 in | Wt 132.0 lb

## 2021-07-27 DIAGNOSIS — I639 Cerebral infarction, unspecified: Secondary | ICD-10-CM

## 2021-07-27 DIAGNOSIS — R10814 Left lower quadrant abdominal tenderness: Secondary | ICD-10-CM | POA: Diagnosis not present

## 2021-07-27 LAB — URINALYSIS, ROUTINE W REFLEX MICROSCOPIC
Bilirubin Urine: NEGATIVE
Ketones, ur: NEGATIVE
Leukocytes,Ua: NEGATIVE
Nitrite: NEGATIVE
Specific Gravity, Urine: 1.025 (ref 1.000–1.030)
Total Protein, Urine: NEGATIVE
Urine Glucose: NEGATIVE
Urobilinogen, UA: 0.2 (ref 0.0–1.0)
pH: 6 (ref 5.0–8.0)

## 2021-07-27 NOTE — Progress Notes (Signed)
Abd t[facility]erness  Subjective:  Patient ID: Connie Meyer, female    DOB: 06/01/1951  Age: 70 y.o. MRN: [id number]  CC: Abdominal Pain  This visit occurred during the SARS-CoV-2 public health emergency.  Safety protocols were in place, including screening questions prior to the visit, additional usage of staff PPE, and extensive cleaning of exam room while observing appropriate contact time as indicated for disinfecting solutions.    HPI Connie Meyer presents for f/up -  She complains of a 1 month history of left lower quadrant abdominal pain that she describes as an achy sensation that radiates into her back.  She has taken an antibiotic for presumed diverticulitis with no improvement.  She has had a few episodes of nausea but she denies vomiting, loss of appetite, weight loss, fever, chills, dysuria, hematuria, constipation, diarrhea, melena, or bright red blood per rectum.  Outpatient Medications Prior to Visit  Medication Sig Dispense Refill   amLODipine (NORVASC) 10 MG tablet Take 1 tablet (10 mg total) by mouth daily. 90 tablet 3   apixaban (ELIQUIS) 5 MG TABS tablet TAKE 1 TABLET(5 MG) BY MOUTH TWICE DAILY 180 tablet 0   atorvastatin (LIPITOR) 40 MG tablet Take 1 tablet (40 mg total) by mouth daily at 6 PM. 90 tablet 3   bimatoprost (LATISSE) 0.03 % ophthalmic solution 1 application daily.     ciprofloxacin (CIPRO) 500 MG tablet Take 1 tablet (500 mg total) by mouth 2 (two) times daily for 10 days. 20 tablet 0   fluticasone (FLONASE) 50 MCG/ACT nasal spray SHAKE LIQUID AND USE 1 SPRAY IN EACH NOSTRIL TWICE DAILY 48 g 3   traMADol (ULTRAM) 50 MG tablet Take 1 tablet (50 mg total) by mouth every 6 (six) hours as needed. 270 tablet 1   zolpidem (AMBIEN CR) 6.25 MG CR tablet Take 1 tablet (6.25 mg total) by mouth at bedtime as needed for sleep. 90 tablet 1   No facility-administered medications prior to visit.    ROS Review of Systems  Constitutional:  Negative for chills  and fever.  HENT: Negative.    Eyes: Negative.   Respiratory:  Negative for cough, chest tightness, shortness of breath and wheezing.   Cardiovascular:  Negative for chest pain, palpitations and leg swelling.  Gastrointestinal:  Positive for abdominal pain and nausea. Negative for blood in stool, constipation, diarrhea and vomiting.  Endocrine: Negative.   Genitourinary: Negative.  Negative for decreased urine volume, difficulty urinating, dysuria, flank pain, hematuria and urgency.  Musculoskeletal: Negative.  Negative for arthralgias and myalgias.  Skin: Negative.  Negative for color change and pallor.  Neurological: Negative.  Negative for dizziness and weakness.  Hematological:  Negative for adenopathy. Does not bruise/bleed easily.  Psychiatric/Behavioral: Negative.     Objective:  BP 116/76 (BP Location: Left Arm, Patient Position: Sitting, Cuff Size: Large)   Pulse 89   Temp 98.1 F (36.7 C) (Oral)   Resp 16   Ht '5\' 6"'$  (1.676 m)   Wt 132 lb (59.9 kg)   SpO2 96%   BMI 21.31 kg/m   BP Readings from Last 3 Encounters:  07/27/21 116/76  07/19/21 124/72  06/06/21 136/80    Wt Readings from Last 3 Encounters:  07/27/21 132 lb (59.9 kg)  07/19/21 131 lb 9.6 oz (59.7 kg)  06/06/21 129 lb (58.5 kg)    Physical Exam Vitals reviewed.  Constitutional:      Appearance: She is not ill-appearing.  HENT:     Nose: Nose normal.  Mouth/Throat:     Mouth: Mucous membranes are moist.  Eyes:     Conjunctiva/sclera: Conjunctivae normal.  Cardiovascular:     Rate and Rhythm: Normal rate and regular rhythm.     Heart sounds: No murmur heard. Pulmonary:     Effort: Pulmonary effort is normal.     Breath sounds: No stridor. No wheezing, rhonchi or rales.  Abdominal:     General: Abdomen is flat. Bowel sounds are normal. There is no distension.     Palpations: Abdomen is soft. There is no hepatomegaly, splenomegaly or mass.     Tenderness: There is abdominal tenderness in the  left lower quadrant. There is no right CVA tenderness, left CVA tenderness, guarding or rebound. Negative signs include psoas sign.  Musculoskeletal:     Cervical back: Neck supple.  Lymphadenopathy:     Cervical: No cervical adenopathy.  Neurological:     Mental Status: She is alert.    Lab Results  Component Value Date   WBC 4.8 07/19/2021   HGB 12.8 07/19/2021   HCT 39.0 07/19/2021   PLT 201.0 07/19/2021   GLUCOSE 96 07/19/2021   CHOL 183 06/06/2021   TRIG 108.0 06/06/2021   HDL 62.10 06/06/2021   LDLCALC 99 06/06/2021   ALT 11 07/19/2021   AST 15 07/19/2021   NA 140 07/19/2021   K 4.8 07/19/2021   CL 104 07/19/2021   CREATININE 0.82 07/19/2021   BUN 18 07/19/2021   CO2 28 07/19/2021   TSH 2.78 06/06/2021   INR 1.0 08/11/2019   HGBA1C 5.9 06/06/2021    DG Chest 2 View  Result Date: 03/14/2021 CLINICAL DATA:  Shortness of breath. EXAM: CHEST - 2 VIEW COMPARISON:  Chest x-ray dated July 15, 2019. FINDINGS: The heart size and mediastinal contours are within normal limits. New loop recorder over the lower midline chest. Normal pulmonary vascularity. Unchanged biapical pleuroparenchymal scarring. No focal consolidation, pleural effusion, or pneumothorax. No acute osseous abnormality. Unchanged surgical clips in both axilla. IMPRESSION: 1. No acute cardiopulmonary disease. Electronically Signed   By: Titus Dubin M.D.   On: 03/14/2021 14:16    Assessment & Plan:   Ursula was seen today for abdominal pain.  Diagnoses and all orders for this visit:  Left lower quadrant abdominal tenderness without rebound tenderness- Her labs are normal.  I recommended that she undergo a CT scan of the abdomen with contrast to screen for mass, abscess, and diverticulitis. -     Urinalysis, Routine w reflex microscopic; Future -     CT Abdomen Pelvis W Contrast; Future -     Urinalysis, Routine w reflex microscopic  I am having Connie Meyer "[name]" maintain her bimatoprost,  atorvastatin, fluticasone, amLODipine, zolpidem, traMADol, ciprofloxacin, and apixaban.  No orders of the defined types were placed in this encounter.    Follow-up: No follow-ups on file.  Scarlette Calico, MD

## 2021-07-27 NOTE — Patient Instructions (Signed)

## 2021-08-01 ENCOUNTER — Other Ambulatory Visit: Payer: Self-pay

## 2021-08-01 ENCOUNTER — Other Ambulatory Visit: Payer: Self-pay | Admitting: Internal Medicine

## 2021-08-01 ENCOUNTER — Ambulatory Visit
Admission: RE | Admit: 2021-08-01 | Discharge: 2021-08-01 | Disposition: A | Discharge status: Home / Self Care | Payer: Medicare Other | Source: Ambulatory Visit | Attending: Internal Medicine | Admitting: Internal Medicine

## 2021-08-01 DIAGNOSIS — Q433 Congenital malformations of intestinal fixation: Secondary | ICD-10-CM | POA: Diagnosis not present

## 2021-08-01 DIAGNOSIS — J984 Other disorders of lung: Secondary | ICD-10-CM | POA: Diagnosis not present

## 2021-08-01 DIAGNOSIS — I700 Atherosclerosis of aorta: Secondary | ICD-10-CM | POA: Diagnosis not present

## 2021-08-01 DIAGNOSIS — M419 Scoliosis, unspecified: Secondary | ICD-10-CM | POA: Diagnosis not present

## 2021-08-01 DIAGNOSIS — Q438 Other specified congenital malformations of intestine: Secondary | ICD-10-CM | POA: Insufficient documentation

## 2021-08-01 DIAGNOSIS — R10814 Left lower quadrant abdominal tenderness: Secondary | ICD-10-CM

## 2021-08-01 MED ORDER — IOPAMIDOL (ISOVUE-300) INJECTION 61%
100.0000 mL | Freq: Once | INTRAVENOUS | Status: AC | PRN
  Administered 2021-08-01: 100 mL via INTRAVENOUS

## 2021-08-03 ENCOUNTER — Telehealth: Payer: Self-pay | Admitting: Internal Medicine

## 2021-08-03 NOTE — Telephone Encounter (Signed)
Patient called in wanting to know results of her CT Scan 08.08.22  Please provide results to patient when available [phone]

## 2021-08-04 NOTE — Telephone Encounter (Signed)
Pt has been informed of results below and expressed understanding.    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  I do not know if you are aware of this.  I will send a referral to a surgeon to see if something needs to be done about this.  The rest of the scan was normal.   Connie Meyer

## 2021-08-08 ENCOUNTER — Ambulatory Visit (INDEPENDENT_AMBULATORY_CARE_PROVIDER_SITE_OTHER): Payer: Medicare Other

## 2021-08-08 DIAGNOSIS — I6300 Cerebral infarction due to thrombosis of unspecified precerebral artery: Secondary | ICD-10-CM

## 2021-08-09 LAB — CUP PACEART REMOTE DEVICE CHECK
Date Time Interrogation Session: 20220814230459
Implantable Pulse Generator Implant Date: 20200818

## 2021-08-16 DIAGNOSIS — R1032 Left lower quadrant pain: Secondary | ICD-10-CM | POA: Diagnosis not present

## 2021-08-19 ENCOUNTER — Other Ambulatory Visit: Payer: Self-pay | Admitting: Surgery

## 2021-08-19 DIAGNOSIS — R1032 Left lower quadrant pain: Secondary | ICD-10-CM

## 2021-08-24 DIAGNOSIS — R1032 Left lower quadrant pain: Secondary | ICD-10-CM | POA: Diagnosis not present

## 2021-08-24 DIAGNOSIS — M545 Low back pain, unspecified: Secondary | ICD-10-CM | POA: Diagnosis not present

## 2021-08-27 NOTE — Progress Notes (Signed)
Carelink Summary Report / Loop Recorder 

## 2021-09-02 DIAGNOSIS — Z4789 Encounter for other orthopedic aftercare: Secondary | ICD-10-CM | POA: Diagnosis not present

## 2021-09-12 ENCOUNTER — Ambulatory Visit (INDEPENDENT_AMBULATORY_CARE_PROVIDER_SITE_OTHER): Payer: Medicare Other

## 2021-09-12 DIAGNOSIS — I6300 Cerebral infarction due to thrombosis of unspecified precerebral artery: Secondary | ICD-10-CM

## 2021-09-14 LAB — CUP PACEART REMOTE DEVICE CHECK
Date Time Interrogation Session: 20220916230532
Implantable Pulse Generator Implant Date: 20200818

## 2021-09-19 NOTE — Progress Notes (Signed)
Carelink Summary Report / Loop Recorder 

## 2021-09-28 ENCOUNTER — Ambulatory Visit (INDEPENDENT_AMBULATORY_CARE_PROVIDER_SITE_OTHER): Payer: Medicare Other | Admitting: Internal Medicine

## 2021-09-28 ENCOUNTER — Encounter: Payer: Self-pay | Admitting: Internal Medicine

## 2021-09-28 ENCOUNTER — Ambulatory Visit (INDEPENDENT_AMBULATORY_CARE_PROVIDER_SITE_OTHER): Payer: Medicare Other

## 2021-09-28 ENCOUNTER — Other Ambulatory Visit: Payer: Self-pay | Admitting: Internal Medicine

## 2021-09-28 ENCOUNTER — Other Ambulatory Visit: Payer: Self-pay

## 2021-09-28 VITALS — BP 136/76 | HR 85 | Temp 98.2°F | Resp 16 | Ht 66.0 in | Wt 133.0 lb

## 2021-09-28 DIAGNOSIS — I639 Cerebral infarction, unspecified: Secondary | ICD-10-CM | POA: Diagnosis not present

## 2021-09-28 DIAGNOSIS — G8929 Other chronic pain: Secondary | ICD-10-CM | POA: Diagnosis not present

## 2021-09-28 DIAGNOSIS — F5104 Psychophysiologic insomnia: Secondary | ICD-10-CM

## 2021-09-28 DIAGNOSIS — Z23 Encounter for immunization: Secondary | ICD-10-CM | POA: Diagnosis not present

## 2021-09-28 DIAGNOSIS — M25552 Pain in left hip: Secondary | ICD-10-CM

## 2021-09-28 DIAGNOSIS — I10 Essential (primary) hypertension: Secondary | ICD-10-CM

## 2021-09-28 MED ORDER — AMLODIPINE BESYLATE 10 MG PO TABS: 10.0000 mg | ORAL_TABLET | Freq: Every day | ORAL | 1 refills | Status: DC

## 2021-09-28 MED ORDER — ZOLPIDEM TARTRATE ER 6.25 MG PO TBCR: 6.2500 mg | EXTENDED_RELEASE_TABLET | Freq: Every evening | ORAL | 1 refills | Status: DC | PRN

## 2021-09-28 NOTE — Patient Instructions (Signed)
Hip Pain The hip is the joint between the upper legs and the lower pelvis. The bones, cartilage, tendons, and muscles of your hip joint support your body and allow you to move around. Hip pain can range from a minor ache to severe pain in one or both of your hips. The pain may be felt on the inside of the hip joint near the groin, or on the outside near the buttocks and upper thigh. You may also have swelling or stiffness in your hip area. Follow these instructions at home: Managing pain, stiffness, and swelling   If directed, put ice on the painful area. To do this: Put ice in a plastic bag. Place a towel between your skin and the bag. Leave the ice on for 20 minutes, 2-3 times a day. If directed, apply heat to the affected area as often as told by your health care provider. Use the heat source that your health care provider recommends, such as a moist heat pack or a heating pad. Place a towel between your skin and the heat source. Leave the heat on for 20-30 minutes. Remove the heat if your skin turns bright red. This is especially important if you are unable to feel pain, heat, or cold. You may have a greater risk of getting burned. Activity Do exercises as told by your health care provider. Avoid activities that cause pain. General instructions  Take over-the-counter and prescription medicines only as told by your health care provider. Keep a journal of your symptoms. Write down: How often you have hip pain. The location of your pain. What the pain feels like. What makes the pain worse. Sleep with a pillow between your legs on your most comfortable side. Keep all follow-up visits as told by your health care provider. This is important. Contact a health care provider if: You cannot put weight on your leg. Your pain or swelling continues or gets worse after one week. It gets harder to walk. You have a fever. Get help right away if: You fall. You have a sudden increase in pain and  swelling in your hip. Your hip is red or swollen or very tender to touch. Summary Hip pain can range from a minor ache to severe pain in one or both of your hips. The pain may be felt on the inside of the hip joint near the groin, or on the outside near the buttocks and upper thigh. Avoid activities that cause pain. Write down how often you have hip pain, the location of the pain, what makes it worse, and what it feels like. This information is not intended to replace advice given to you by your health care provider. Make sure you discuss any questions you have with your health care provider. Document Revised: 04/28/2019 Document Reviewed: 04/28/2019 Elsevier Patient Education  2022 [employer]  

## 2021-09-28 NOTE — Progress Notes (Signed)
Subjective:  Patient ID: Connie Meyer, female    DOB: 11-Sep-1951  Age: 70 y.o. MRN: [id number]  CC: Hip Pain  This visit occurred during the SARS-CoV-2 public health emergency.  Safety protocols were in place, including screening questions prior to the visit, additional usage of staff PPE, and extensive cleaning of exam room while observing appropriate contact time as indicated for disinfecting solutions.    HPI Connie Meyer presents for f/up -  When I last saw Connie Meyer Connie Meyer was experiencing left lower quadrant abdominal pain.  A CT scan showed redundant colon.  Connie Meyer has since seen surgery and GI and has been told Connie Meyer symptoms are not coming from the colon.  Connie Meyer now localizes the pain to Connie Meyer left hip.  Connie Meyer gastroenterologist prescribed a course of steroids and the hip pain resolved.  Connie Meyer has been taking aspirin for pain even though Connie Meyer knows that this is contraindicated because Connie Meyer is taking a DOAC.  Outpatient Medications Prior to Visit  Medication Sig Dispense Refill   apixaban (ELIQUIS) 5 MG TABS tablet TAKE 1 TABLET(5 MG) BY MOUTH TWICE DAILY 180 tablet 0   atorvastatin (LIPITOR) 40 MG tablet Take 1 tablet (40 mg total) by mouth daily at 6 PM. 90 tablet 3   bimatoprost (LATISSE) 0.03 % ophthalmic solution 1 application daily.     fluticasone (FLONASE) 50 MCG/ACT nasal spray SHAKE LIQUID AND USE 1 SPRAY IN EACH NOSTRIL TWICE DAILY 48 g 3   traMADol (ULTRAM) 50 MG tablet Take 1 tablet (50 mg total) by mouth every 6 (six) hours as needed. 270 tablet 1   amLODipine (NORVASC) 10 MG tablet Take 1 tablet (10 mg total) by mouth daily. 90 tablet 3   zolpidem (AMBIEN CR) 6.25 MG CR tablet Take 1 tablet (6.25 mg total) by mouth at bedtime as needed for sleep. 90 tablet 1   No facility-administered medications prior to visit.    ROS Review of Systems  Constitutional:  Negative for diaphoresis and fatigue.  HENT: Negative.    Eyes: Negative.   Respiratory:  Negative for cough,  shortness of breath and wheezing.   Cardiovascular:  Negative for chest pain, palpitations and leg swelling.  Gastrointestinal:  Negative for abdominal pain, blood in stool, constipation, diarrhea and vomiting.  Endocrine: Negative.   Genitourinary: Negative.  Negative for difficulty urinating.  Musculoskeletal:  Positive for arthralgias. Negative for back pain and myalgias.  Skin: Negative.  Negative for rash.  Neurological: Negative.  Negative for dizziness, weakness, light-headedness and headaches.  Hematological:  Negative for adenopathy. Does not bruise/bleed easily.  Psychiatric/Behavioral:  Positive for sleep disturbance. Negative for decreased concentration. The patient is not nervous/anxious.    Objective:  BP 136/76 (BP Location: Left Arm, Patient Position: Sitting, Cuff Size: Large)   Pulse 85   Temp 98.2 F (36.8 C) (Oral)   Ht 5\' 6"  (1.676 m)   Wt 133 lb (60.3 kg)   SpO2 96%   BMI 21.47 kg/m   BP Readings from Last 3 Encounters:  09/28/21 136/76  07/27/21 116/76  07/19/21 124/72    Wt Readings from Last 3 Encounters:  09/28/21 133 lb (60.3 kg)  07/27/21 132 lb (59.9 kg)  07/19/21 131 lb 9.6 oz (59.7 kg)    Physical Exam Vitals reviewed.  HENT:     Nose: Nose normal.     Mouth/Throat:     Mouth: Mucous membranes are moist.  Eyes:     Conjunctiva/sclera: Conjunctivae normal.  Cardiovascular:  Rate and Rhythm: Normal rate and regular rhythm.     Pulses: Normal pulses.     Heart sounds: No murmur heard. Pulmonary:     Effort: Pulmonary effort is normal.     Breath sounds: No stridor. No wheezing, rhonchi or rales.  Abdominal:     General: Abdomen is flat. Bowel sounds are normal. There is no distension.     Palpations: Abdomen is soft. There is no hepatomegaly, splenomegaly or mass.     Tenderness: There is no abdominal tenderness.     Hernia: No hernia is present.  Musculoskeletal:        General: Normal range of motion.     Right hip: Normal.      Left hip: Normal. No deformity, tenderness or bony tenderness. Normal range of motion.     Right lower leg: No edema.     Left lower leg: No edema.  Skin:    General: Skin is warm and dry.  Neurological:     General: No focal deficit present.     Mental Status: Connie Meyer is alert. Mental status is at baseline.  Psychiatric:        Mood and Affect: Mood normal.        Behavior: Behavior normal.    Lab Results  Component Value Date   WBC 4.8 07/19/2021   HGB 12.8 07/19/2021   HCT 39.0 07/19/2021   PLT 201.0 07/19/2021   GLUCOSE 96 07/19/2021   CHOL 183 06/06/2021   TRIG 108.0 06/06/2021   HDL 62.10 06/06/2021   LDLCALC 99 06/06/2021   ALT 11 07/19/2021   AST 15 07/19/2021   NA 140 07/19/2021   K 4.8 07/19/2021   CL 104 07/19/2021   CREATININE 0.82 07/19/2021   BUN 18 07/19/2021   CO2 28 07/19/2021   TSH 2.78 06/06/2021   INR 1.0 08/11/2019   HGBA1C 5.9 06/06/2021    CT Abdomen Pelvis W Contrast  Result Date: 08/01/2021 CLINICAL DATA:  70 year old female with left lower quadrant abdominal pain for 1.5 months. History of treated breast cancer. EXAM: CT ABDOMEN AND PELVIS WITH CONTRAST TECHNIQUE: Multidetector CT imaging of the abdomen and pelvis was performed using the standard protocol following bolus administration of intravenous contrast. CONTRAST:  13mL ISOVUE-300 IOPAMIDOL (ISOVUE-300) INJECTION 61% COMPARISON:  CT Abdomen and Pelvis 05/19/2016. FINDINGS: Lower chest: Visible breast implants are stable. There is subxiphoid subcutaneous rectangular metallic device, possibly cardiac loop recorder which is new since 2017. Cardiac size is at the upper limits of normal. No pericardial or pleural effusion. Stable lung bases with mild left lower lobe scarring and a small benign 5 mm nodule in the left costophrenic angle (series 4, image 10). Hepatobiliary: Liver and gallbladder are stable and within normal limits. Pancreas: Negative. Spleen: Negative. Adrenals/Urinary Tract: Normal adrenal  glands. Symmetric renal enhancement and contrast excretion. Negative kidneys and proximal ureters. Distal ureters remain decompressed. Diminutive and unremarkable bladder. Stomach/Bowel: Oral contrast has reached the sigmoid colon without obstruction. There is moderate sigmoid diverticulosis, but no convincing active inflammation. A mildly indistinct appearance of the sigmoid wall is more noticeable than in 2017. The rectum appears negative. Diverticulosis also throughout the descending colon with no active inflammation. Redundant transverse colon with moderate diverticulosis in its midportion. No active inflammation. Negative right colon with diminutive or absent appendix. Negative terminal ileum. No dilated small bowel. Stomach and duodenum are within normal limits. No free air or free fluid. Vascular/Lymphatic: Aortoiliac calcified atherosclerosis. Major arterial structures are patent. No lymphadenopathy.  Portal venous system is patent. Reproductive: Chronically absent uterus, diminutive or absent ovaries. Other: No pelvic free fluid. Musculoskeletal: Mild dextroconvex lumbar scoliosis appears increased since 2017. No acute or suspicious osseous lesion is identified. IMPRESSION: 1. Chronic large bowel redundancy and diverticulosis. Difficult to exclude a mild diverticulitis of the sigmoid colon. But no other acute or inflammatory process identified in the abdomen or pelvis. 2. Aortic Atherosclerosis (ICD10-I70.0). Electronically Signed   By: Genevie Ann M.D.   On: 08/01/2021 10:35   Narrative & Impression  CLINICAL DATA:  Left hip pain for several months, no known injury, initial encounter   EXAM: DG HIP (WITH OR WITHOUT PELVIS) 3V LEFT   COMPARISON:  None.   FINDINGS: Pelvic ring is intact. No acute fracture or dislocation is noted. No soft tissue abnormality is seen.   IMPRESSION: No acute abnormality noted.     Electronically Signed   By: Inez Catalina M.D.   On: 09/29/2021 20:32     Assessment & Plan:   Connie Meyer was seen today for hip pain.  Diagnoses and all orders for this visit:  Primary hypertension- Connie Meyer blood pressure is adequately well controlled. -     amLODipine (NORVASC) 10 MG tablet; Take 1 tablet (10 mg total) by mouth daily.  Psychophysiological insomnia -     zolpidem (AMBIEN CR) 6.25 MG CR tablet; Take 1 tablet (6.25 mg total) by mouth at bedtime as needed for sleep.  Flu vaccine need -     Flu Vaccine QUAD High Dose(Fluad)  Chronic left hip pain- Connie Meyer symptoms, exam, and plain films are reassuring.  I do not see any cause for the hip pain.  I have asked Connie Meyer to stop taking aspirin.  If Connie Meyer needs something for pain that I would consider a Cox 2 inhibitor. -     Cancel: DG Hip Unilat W OR W/O Pelvis Min 4 Views Left; Future  I am having Connie Meyer "Connie Meyer" maintain Connie Meyer bimatoprost, atorvastatin, fluticasone, traMADol, apixaban, amLODipine, and zolpidem.  Meds ordered this encounter  Medications   amLODipine (NORVASC) 10 MG tablet    Sig: Take 1 tablet (10 mg total) by mouth daily.    Dispense:  90 tablet    Refill:  1   zolpidem (AMBIEN CR) 6.25 MG CR tablet    Sig: Take 1 tablet (6.25 mg total) by mouth at bedtime as needed for sleep.    Dispense:  90 tablet    Refill:  1     Follow-up: Return in about 6 months (around 03/29/2022).  Scarlette Calico, MD

## 2021-10-11 ENCOUNTER — Telehealth (INDEPENDENT_AMBULATORY_CARE_PROVIDER_SITE_OTHER): Payer: Medicare Other | Admitting: Family Medicine

## 2021-10-11 ENCOUNTER — Encounter: Payer: Self-pay | Admitting: Family Medicine

## 2021-10-11 DIAGNOSIS — R6889 Other general symptoms and signs: Secondary | ICD-10-CM

## 2021-10-11 DIAGNOSIS — I639 Cerebral infarction, unspecified: Secondary | ICD-10-CM | POA: Diagnosis not present

## 2021-10-11 MED ORDER — ONDANSETRON 4 MG PO TBDP: 4.0000 mg | ORAL_TABLET | Freq: Three times a day (TID) | ORAL | 0 refills | Status: DC | PRN

## 2021-10-11 MED ORDER — BENZONATATE 100 MG PO CAPS: 100.0000 mg | ORAL_CAPSULE | Freq: Three times a day (TID) | ORAL | 0 refills | Status: DC | PRN

## 2021-10-11 NOTE — Patient Instructions (Signed)
  HOME CARE TIPS:  -COVID19 testing information: [email or web address].[email or web address]  Most pharmacies also offer testing and home test kits. If the Covid19 test is positive and you desire antiviral treatment, please contact a [facility] or schedule a follow up virtual visit through your primary care office or through the [facility].  Other test to treat options: ConnectRV.is?[email or web address]  -I sent the medication(s) we discussed to your pharmacy: Meds ordered this encounter  Medications   benzonatate (TESSALON PERLES) 100 MG capsule    Sig: Take 1 capsule (100 mg total) by mouth 3 (three) times daily as needed.    Dispense:  20 capsule    Refill:  0   ondansetron (ZOFRAN ODT) 4 MG disintegrating tablet    Sig: Take 1 tablet (4 mg total) by mouth every 8 (eight) hours as needed for nausea or vomiting.    Dispense:  10 tablet    Refill:  0      -can use tylenol if needed for fevers, aches and pains per instructions  -can use nasal saline a few times per day if you have nasal congestion  -stay hydrated, drink plenty of fluids and eat small healthy meals - avoid dairy  -If the Covid test is positive, check out the [facility] website for more information on home care, transmission and treatment for COVID19  -follow up with your doctor in 2-3 days unless improving and feeling better  -stay home while sick, except to seek medical care. If you have COVID19, ideally it would be best to stay home for a full 10 days since the onset of symptoms PLUS one day of no fever and feeling better. Wear a good mask that fits snugly (such as N95 or KN95) if around others to reduce the risk of transmission.  It was nice to meet you today, and I really hope you are feeling better soon. I help [facility] out with telemedicine visits on Tuesdays and Thursdays and am available for visits on those days. If you have any concerns or questions following this visit please  schedule a follow up visit with your Primary Care doctor or seek care at a local urgent care clinic to avoid delays in care.    Seek in person care or schedule a follow up video visit promptly if your symptoms worsen, new concerns arise or you are not improving with treatment. Call 911 and/or seek emergency care if your symptoms are severe or life threatening.

## 2021-10-11 NOTE — Progress Notes (Signed)
Virtual Visit via Video Note  I connected with [name]  on 10/11/21 at  1:20 PM EDT by a video enabled telemedicine application and verified that I am speaking with the correct person using two identifiers.  Location patient: home, [place] Location provider:work or home office Persons participating in the virtual visit: patient, provider  I discussed the limitations of evaluation and management by telemedicine and the availability of in person appointments. The patient expressed understanding and agreed to proceed.   HPI:  Acute telemedicine visit for flu like symptoms: -Onset: about 4-5 days ago -Symptoms include: congestion, cough, nausea, poor appetite, body aches, temp around 100 (but is not sure if it is working) -she looked her symptoms up online and is sure she has the flu -DIL did a covid test for her when she first got sick - which was negative -Denies: CP, SOB, NVD, inability to eat/drink/get out of bed -Has tried: nothing -Pertinent past medical history: see below -Pertinent medication allergies: No Known Allergies -COVID-19 vaccine status: had flu shot a few weeks ago, had 3 covid shots  ROS: See pertinent positives and negatives per HPI.  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 No further therapy. Currently in remission.   HTN (hypertension)    Hypertension    Phreesia 06/21/2020   Hypokalemia 06/14/2019   Impingement syndrome of right shoulder 01/27/2020   Insomnia 09/15/2019   Stroke [facility])    Phreesia 06/21/2020    Past Surgical History:  Procedure Laterality Date   ABDOMINAL HYSTERECTOMY N/A    Phreesia 06/21/2020   BREAST  SURGERY     BUBBLE STUDY  06/16/2019   Procedure: BUBBLE STUDY;  Surgeon: Fay Records, MD;  Location: [facility];  Service: Cardiovascular;;   LOOP RECORDER INSERTION N/A 06/16/2019   Procedure: LOOP RECORDER INSERTION;  Surgeon: Evans Lance, MD;  Location: [facility];  Service: Cardiovascular;  Laterality: N/A;   LOOP RECORDER INSERTION N/A 08/12/2019   Procedure: LOOP RECORDER INSERTION;  Surgeon: Thompson Grayer, MD;  Location: [facility];  Service: Cardiovascular;  Laterality: N/A;   TEE WITHOUT CARDIOVERSION N/A 06/16/2019   Procedure: TRANSESOPHAGEAL ECHOCARDIOGRAM (TEE);  Surgeon: Fay Records, MD;  Location: [facility] ENDOSCOPY;  Service: Cardiovascular;  Laterality: N/A;     Current Outpatient Medications:    amLODipine (NORVASC) 10 MG tablet, Take 1 tablet (10 mg total) by mouth daily., Disp: 90 tablet, Rfl: 1   apixaban (ELIQUIS) 5 MG TABS tablet, TAKE 1 TABLET(5 MG) BY MOUTH TWICE DAILY, Disp: 180 tablet, Rfl: 0   atorvastatin (LIPITOR) 40 MG tablet, Take 1 tablet (40 mg total) by mouth daily at 6 PM., Disp: 90 tablet, Rfl: 3   benzonatate (TESSALON PERLES) 100 MG capsule, Take 1 capsule (100 mg total) by mouth 3 (three) times daily as needed., Disp: 20 capsule, Rfl: 0   fluticasone (FLONASE) 50 MCG/ACT nasal spray, SHAKE LIQUID AND USE 1 SPRAY IN EACH NOSTRIL TWICE DAILY, Disp: 48 g, Rfl: 3   ondansetron (ZOFRAN ODT) 4 MG disintegrating tablet, Take 1 tablet (4 mg total) by mouth every 8 (eight) hours as needed for nausea or vomiting., Disp: 10 tablet,  Rfl: 0   traMADol (ULTRAM) 50 MG tablet, Take 1 tablet (50 mg total) by mouth every 6 (six) hours as needed., Disp: 270 tablet, Rfl: 1   zolpidem (AMBIEN CR) 6.25 MG CR tablet, Take 1 tablet (6.25 mg total) by mouth at bedtime as needed for sleep., Disp: 90 tablet, Rfl: 1  EXAM:  VITALS per patient if applicable:  GENERAL: alert, oriented, appears well and in no acute distress  HEENT: atraumatic, conjunttiva clear,  no obvious abnormalities on inspection of external nose and ears  NECK: normal movements of the head and neck  LUNGS: on inspection no signs of respiratory distress, breathing rate appears normal, no obvious gross SOB, gasping or wheezing  CV: no obvious cyanosis  MS: moves all visible extremities without noticeable abnormality  PSYCH/NEURO: pleasant and cooperative, no obvious depression or anxiety, speech and thought processing grossly intact  ASSESSMENT AND PLAN:  Discussed the following assessment and plan:  Flu-like symptoms  -we discussed possible serious and likely etiologies, options for evaluation and workup, limitations of telemedicine visit vs in person visit, treatment, treatment risks and precautions. Pt is agreeable to treatment via telemedicine at this moment. Query influenza, [phone] with false neg early testing vs other. She agrees to recheck covid test. Advised if positive today and desires antiviral can call the [facility] pharmacy or do a follow up virtual visit. Out of treatment window for likely benefit from tamiflu and opted against. Advised increase fluid intake and light dairy free diet. She has had nausea and is not hungry. Sent zofran and tessalon.  Advised to seek prompt in person care if worsening, new symptoms arise, or if is not improving with treatment. Discussed options for inperson care if PCP office not available. Did let this patient know that I only do telemedicine on Tuesdays and Thursdays for [facility]. Advised to schedule follow up visit with PCP or [facility] if any further questions or concerns to avoid delays in care.   I discussed the assessment and treatment plan with the patient. The patient was provided an opportunity to ask questions and all were answered. The patient agreed with the plan and demonstrated an understanding of the instructions.     Lucretia Kern, DO

## 2021-10-17 ENCOUNTER — Ambulatory Visit (INDEPENDENT_AMBULATORY_CARE_PROVIDER_SITE_OTHER): Payer: Medicare Other

## 2021-10-17 DIAGNOSIS — I6300 Cerebral infarction due to thrombosis of unspecified precerebral artery: Secondary | ICD-10-CM

## 2021-10-17 LAB — CUP PACEART REMOTE DEVICE CHECK
Date Time Interrogation Session: 20221019230648
Implantable Pulse Generator Implant Date: 20200818

## 2021-10-20 ENCOUNTER — Other Ambulatory Visit: Payer: Self-pay | Admitting: Internal Medicine

## 2021-10-20 DIAGNOSIS — I6300 Cerebral infarction due to thrombosis of unspecified precerebral artery: Secondary | ICD-10-CM

## 2021-10-25 NOTE — Progress Notes (Signed)
Carelink Summary Report / Loop Recorder 

## 2021-10-31 ENCOUNTER — Other Ambulatory Visit: Payer: Self-pay

## 2021-10-31 ENCOUNTER — Ambulatory Visit: Payer: Medicare Other

## 2021-10-31 VITALS — BP 112/78 | HR 72 | Temp 98.0°F | Ht 66.0 in | Wt 128.0 lb

## 2021-10-31 DIAGNOSIS — Z780 Asymptomatic menopausal state: Secondary | ICD-10-CM

## 2021-10-31 NOTE — Patient Instructions (Signed)
[name] , Thank you for taking time to come for your Medicare Wellness Visit. I appreciate your ongoing commitment to your health goals. Please review the following plan we discussed and let me know if I can assist you in the future.   Screening recommendations/referrals: Colonoscopy: 05/25/2016 Mammogram: no longer required  Bone Density: referral 10/31/2021 Recommended yearly ophthalmology/optometry visit for glaucoma screening and checkup Recommended yearly dental visit for hygiene and checkup  Vaccinations: Influenza vaccine: completed  Pneumococcal vaccine: completed  Tdap vaccine: 06/06/2021 Shingles vaccine: declined     Advanced directives: none   Conditions/risks identified: none   Next appointment: none    Preventive Care 65 Years and Older, Female Preventive care refers to lifestyle choices and visits with your health care provider that can promote health and wellness. What does preventive care include? A yearly physical exam. This is also called an annual well check. Dental exams once or twice a year. Routine eye exams. Ask your health care provider how often you should have your eyes checked. Personal lifestyle choices, including: Daily care of your teeth and gums. Regular physical activity. Eating a healthy diet. Avoiding tobacco and drug use. Limiting alcohol use. Practicing safe sex. Taking low-dose aspirin every day. Taking vitamin and mineral supplements as recommended by your health care provider. What happens during an annual well check? The services and screenings done by your health care provider during your annual well check will depend on your age, overall health, lifestyle risk factors, and family history of disease. Counseling  Your health care provider may ask you questions about your: Alcohol use. Tobacco use. Drug use. Emotional well-being. Home and relationship well-being. Sexual activity. Eating habits. History of falls. Memory  and ability to understand (cognition). Work and work Statistician. Reproductive health. Screening  You may have the following tests or measurements: Height, weight, and BMI. Blood pressure. Lipid and cholesterol levels. These may be checked every 5 years, or more frequently if you are over 60 years old. Skin check. Lung cancer screening. You may have this screening every year starting at age 48 if you have a 30-pack-year history of smoking and currently smoke or have quit within the past 15 years. Fecal occult blood test (FOBT) of the stool. You may have this test every year starting at age 17. Flexible sigmoidoscopy or colonoscopy. You may have a sigmoidoscopy every 5 years or a colonoscopy every 10 years starting at age 54. Hepatitis C blood test. Hepatitis B blood test. Sexually transmitted disease (STD) testing. Diabetes screening. This is done by checking your blood sugar (glucose) after you have not eaten for a while (fasting). You may have this done every 1-3 years. Bone density scan. This is done to screen for osteoporosis. You may have this done starting at age 42. Mammogram. This may be done every 1-2 years. Talk to your health care provider about how often you should have regular mammograms. Talk with your health care provider about your test results, treatment options, and if necessary, the need for more tests. Vaccines  Your health care provider may recommend certain vaccines, such as: Influenza vaccine. This is recommended every year. Tetanus, diphtheria, and acellular pertussis (Tdap, Td) vaccine. You may need a Td booster every 10 years. Zoster vaccine. You may need this after age 62. Pneumococcal 13-valent conjugate (PCV13) vaccine. One dose is recommended after age 11. Pneumococcal polysaccharide (PPSV23) vaccine. One dose is recommended after age 82. Talk to your health care provider about which screenings and vaccines you need  and how often you need them. This  information is not intended to replace advice given to you by your health care provider. Make sure you discuss any questions you have with your health care provider. Document Released: 01/07/2016 Document Revised: 08/30/2016 Document Reviewed: 10/12/2015 Elsevier Interactive Patient Education  2017 [place] Prevention in the Home Falls can cause injuries. They can happen to people of all ages. There are many things you can do to make your home safe and to help prevent falls. What can I do on the outside of my home? Regularly fix the edges of walkways and driveways and fix any cracks. Remove anything that might make you trip as you walk through a door, such as a raised step or threshold. Trim any bushes or trees on the path to your home. Use bright outdoor lighting. Clear any walking paths of anything that might make someone trip, such as rocks or tools. Regularly check to see if handrails are loose or broken. Make sure that both sides of any steps have handrails. Any raised decks and porches should have guardrails on the edges. Have any leaves, snow, or ice cleared regularly. Use sand or salt on walking paths during winter. Clean up any spills in your garage right away. This includes oil or grease spills. What can I do in the bathroom? Use night lights. Install grab bars by the toilet and in the tub and shower. Do not use towel bars as grab bars. Use non-skid mats or decals in the tub or shower. If you need to sit down in the shower, use a plastic, non-slip stool. Keep the floor dry. Clean up any water that spills on the floor as soon as it happens. Remove soap buildup in the tub or shower regularly. Attach bath mats securely with double-sided non-slip rug tape. Do not have throw rugs and other things on the floor that can make you trip. What can I do in the bedroom? Use night lights. Make sure that you have a light by your bed that is easy to reach. Do not use any sheets or  blankets that are too big for your bed. They should not hang down onto the floor. Have a firm chair that has side arms. You can use this for support while you get dressed. Do not have throw rugs and other things on the floor that can make you trip. What can I do in the kitchen? Clean up any spills right away. Avoid walking on wet floors. Keep items that you use a lot in easy-to-reach places. If you need to reach something above you, use a strong step stool that has a grab bar. Keep electrical cords out of the way. Do not use floor polish or wax that makes floors slippery. If you must use wax, use non-skid floor wax. Do not have throw rugs and other things on the floor that can make you trip. What can I do with my stairs? Do not leave any items on the stairs. Make sure that there are handrails on both sides of the stairs and use them. Fix handrails that are broken or loose. Make sure that handrails are as long as the stairways. Check any carpeting to make sure that it is firmly attached to the stairs. Fix any carpet that is loose or worn. Avoid having throw rugs at the top or bottom of the stairs. If you do have throw rugs, attach them to the floor with carpet tape. Make sure that you  have a light switch at the top of the stairs and the bottom of the stairs. If you do not have them, ask someone to add them for you. What else can I do to help prevent falls? Wear shoes that: Do not have high heels. Have rubber bottoms. Are comfortable and fit you well. Are closed at the toe. Do not wear sandals. If you use a stepladder: Make sure that it is fully opened. Do not climb a closed stepladder. Make sure that both sides of the stepladder are locked into place. Ask someone to hold it for you, if possible. Clearly mark and make sure that you can see: Any grab bars or handrails. First and last steps. Where the edge of each step is. Use tools that help you move around (mobility aids) if they are  needed. These include: Canes. Walkers. Scooters. Crutches. Turn on the lights when you go into a dark area. Replace any light bulbs as soon as they burn out. Set up your furniture so you have a clear path. Avoid moving your furniture around. If any of your floors are uneven, fix them. If there are any pets around you, be aware of where they are. Review your medicines with your doctor. Some medicines can make you feel dizzy. This can increase your chance of falling. Ask your doctor what other things that you can do to help prevent falls. This information is not intended to replace advice given to you by your health care provider. Make sure you discuss any questions you have with your health care provider. Document Released: 10/07/2009 Document Revised: 05/18/2016 Document Reviewed: 01/15/2015 Elsevier Interactive Patient Education  2017 [employer].

## 2021-10-31 NOTE — Progress Notes (Signed)
Subjective:   Connie Meyer is a 70 y.o. female who presents for Medicare Annual (Subsequent) preventive examination.  Review of Systems     Cardiac Risk Factors include: advanced age (>53men, >23 women);dyslipidemia     Objective:    Today's Vitals   10/31/21 1431 10/31/21 1433  BP: 112/78   Pulse: 72   Temp: 98 F (36.7 C)   SpO2: 99%   Weight: 128 lb (58.1 kg)   Height: 5\' 6"  (1.676 m)   PainSc:  4    Body mass index is 20.66 kg/m.  Advanced Directives 10/31/2021 03/14/2021 07/14/2020 08/11/2019 08/11/2019 08/11/2019 06/16/2019  Does Patient Have a Medical Advance Directive? Yes No Yes - No No No  Type of Paramedic of [place];Living will - [place]  Does patient want to make changes to medical advance directive? - - No - Patient declined - - - -  Copy of [place] in Chart? No - copy requested - No - copy requested - - - -  Would patient like information on creating a medical advance directive? - No - Patient declined - No - Patient declined No - Patient declined - No - Patient declined    Current Medications (verified) Outpatient Encounter Medications as of 10/31/2021  Medication Sig   amLODipine (NORVASC) 10 MG tablet Take 1 tablet (10 mg total) by mouth daily.   atorvastatin (LIPITOR) 40 MG tablet Take 1 tablet (40 mg total) by mouth daily at 6 PM.   ELIQUIS 5 MG TABS tablet TAKE 1 TABLET(5 MG) BY MOUTH TWICE DAILY   fluticasone (FLONASE) 50 MCG/ACT nasal spray SHAKE LIQUID AND USE 1 SPRAY IN EACH NOSTRIL TWICE DAILY   traMADol (ULTRAM) 50 MG tablet Take 1 tablet (50 mg total) by mouth every 6 (six) hours as needed.   zolpidem (AMBIEN CR) 6.25 MG CR tablet Take 1 tablet (6.25 mg total) by mouth at bedtime as needed for sleep.   benzonatate (TESSALON PERLES) 100 MG capsule Take 1 capsule (100 mg total) by mouth 3 (three) times daily as needed. (Patient not taking: Reported on 10/31/2021)    ondansetron (ZOFRAN ODT) 4 MG disintegrating tablet Take 1 tablet (4 mg total) by mouth every 8 (eight) hours as needed for nausea or vomiting. (Patient not taking: Reported on 10/31/2021)   No facility-administered encounter medications on file as of 10/31/2021.    Allergies (verified) Patient has no known allergies.   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 Currently in remission.   HTN (hypertension)    Hypertension    Phreesia 06/21/2020   Hypokalemia 06/14/2019   Impingement syndrome of right shoulder 01/27/2020   Insomnia 09/15/2019   Stroke [facility])    Phreesia 06/21/2020   Past Surgical History:  Procedure Laterality Date   ABDOMINAL HYSTERECTOMY N/A    Phreesia 06/21/2020   BREAST SURGERY     BUBBLE STUDY  06/16/2019   Procedure: BUBBLE STUDY;  Surgeon: Fay Records, MD;  Location: [place];  Service: Cardiovascular;;   LOOP RECORDER INSERTION N/A 06/16/2019   Procedure: LOOP RECORDER  INSERTION;  Surgeon: Evans Lance, MD;  Location: [place] CV LAB;  Service: Cardiovascular;  Laterality: N/A;   LOOP RECORDER INSERTION N/A 08/12/2019   Procedure: LOOP RECORDER INSERTION;  Surgeon: Thompson Grayer, MD;  Location: [place] CV LAB;  Service: Cardiovascular;  Laterality: N/A;   TEE WITHOUT CARDIOVERSION N/A 06/16/2019   Procedure: TRANSESOPHAGEAL ECHOCARDIOGRAM (TEE);  Surgeon: Fay Records, MD;  Location: [facility] ENDOSCOPY;  Service: Cardiovascular;  Laterality: N/A;   Family History  Problem Relation Age of Onset   Hypertension Mother    Hypertension Father    Social History   Socioeconomic History    Marital status: Divorced    Spouse name: Not on file   Number of children: 4   Years of education: 15   Highest education level: Some college, no degree  Occupational History   Occupation: Retired    Comment: Catering manager  Tobacco Use   Smoking status: Never   Smokeless tobacco: Never  Vaping Use   Vaping Use: Never used  Substance and Sexual Activity   Alcohol use: Yes    Alcohol/week: 1.0 standard drink    Types: 1 Glasses of wine per week   Drug use: No   Sexual activity: Not Currently  Other Topics Concern   Not on file  Social History Narrative   Not on file   Social Determinants of Health   Financial Resource Strain: Low Risk    Difficulty of Paying Living Expenses: Not hard at all  Food Insecurity: No Food Insecurity   Worried About Charity fundraiser in the Last Year: Never true   [place] in the Last Year: Never true  Transportation Needs: No Transportation Needs   Lack of Transportation (Medical): No   Lack of Transportation (Non-Medical): No  Physical Activity: Inactive   Days of Exercise per Week: 0 days   Minutes of Exercise per Session: 0 min  Stress: No Stress Concern Present   Feeling of Stress : Not at all  Social Connections: Moderately Isolated   Frequency of Communication with Friends and Family: Twice a week   Frequency of Social Gatherings with Friends and Family: Twice a week   Attends Religious Services: More than 4 times per year   Active Member of [employer] or Organizations: No   Attends Music therapist: Never   Marital Status: Divorced    Tobacco Counseling Counseling given: Not Answered   Clinical Intake:  Pre-visit preparation completed: Yes  Pain : 0-10 Pain Score: 4  Pain Location: Hip Pain Orientation: Left Pain Descriptors / Indicators: Aching, Constant Effect of Pain on Daily Activities: yes     Nutritional Risks: None Diabetes: No  How often do you need to have someone help you when you  read instructions, pamphlets, or other written materials from your doctor or pharmacy?: 1 - Never What is the last grade level you completed in school?: college  Diabetic?no  Interpreter Needed?: No  Information entered by :: [place] of Daily Living In your present state of health, do you have any difficulty performing the following activities: 10/31/2021 07/27/2021  Hearing? N N  Vision? N N  Difficulty concentrating or making decisions? N N  Walking or climbing stairs? N N  Dressing or bathing? N N  Doing errands, shopping? N N  Preparing Food and eating ? N -  Using the Toilet? N -  In the past six months, have you accidently leaked urine? N -  Do you have problems with loss of bowel control? N -  Managing your Medications? N -  Managing your Finances? N -  Housekeeping or managing your Housekeeping? N -  Some recent data might be hidden    Patient Care Team: Janith Lima, MD as PCP - General (Internal Medicine)  Indicate any recent Medical Services you may have received from other than [facility] providers in the past year (date may be approximate).     Assessment:   This is a routine wellness examination for Connie Meyer.  Hearing/Vision screen Vision Screening - Comments:: Declines yearly exam will make your own appointment   Dietary issues and exercise activities discussed: Current Exercise Habits: The patient does not participate in regular exercise at present   Goals Addressed   None    Depression Screen PHQ 2/9 Scores 10/31/2021 10/31/2021 08/03/2020 07/14/2020 06/24/2020 11/24/2019 10/16/2019  PHQ - 2 Score 0 0 0 0 - 2 2  PHQ- 9 Score - - - - - 9 7  Exception Documentation - - - - Other- indicate reason in comment box - -  Not completed - - - - - - -    Fall Risk Fall Risk  10/31/2021 07/27/2021 08/03/2020 07/14/2020 06/24/2020  Falls in the past year? 0 0 - 0 0  Number falls in past yr: 0 - 0 0 -  Injury with Fall? 0 - 0 0 -  Risk Factor Category  - - -  - -  Risk for fall due to : - - No Fall Risks - -  Follow up Falls evaluation completed - Falls evaluation completed Falls evaluation completed;Education provided Falls evaluation completed    FALL RISK PREVENTION PERTAINING TO THE HOME:  Any stairs in or around the home? Yes  If so, are there any without handrails? No  Home free of loose throw rugs in walkways, pet beds, electrical cords, etc? Yes  Adequate lighting in your home to reduce risk of falls? Yes   ASSISTIVE DEVICES UTILIZED TO PREVENT FALLS:  Life alert? No  Use of a cane, walker or w/c? No  Grab bars in the bathroom? No  Shower chair or bench in shower? No  Elevated toilet seat or a handicapped toilet? No   TIMED UP AND GO:  Was the test performed? Yes .  Length of time to ambulate 10 feet: 10 sec.   Gait steady and fast without use of assistive device  Cognitive Function:  Normal cognitive status assessed by direct observation by this Nurse Health Advisor. No abnormalities found.     6CIT Screen 07/14/2020  What Year? 0 points  What month? 0 points  What time? 0 points  Count back from 20 0 points  Months in reverse 0 points  Repeat phrase 2 points  Total Score 2    Immunizations Immunization History  Administered Date(s) Administered   Fluad Quad(high Dose 65+) 10/16/2019, 11/15/2020, 09/28/2021   PFIZER(Purple Top)SARS-COV-2 Vaccination 02/01/2020, 02/26/2020, 11/01/2020   Pneumococcal Conjugate-13 02/17/2019   Pneumococcal Polysaccharide-23 06/06/2021   Tdap 06/06/2021    TDAP status: Up to date  Flu Vaccine status: Up to date  Pneumococcal vaccine status: Up to date  Covid-19 vaccine status: Completed vaccines  Qualifies for Shingles Vaccine? Yes   Zostavax completed No   Shingrix Completed?: No.    Education has been provided regarding the importance of this vaccine. Patient has been advised to call insurance company to determine out of pocket expense if they have not yet received this  vaccine. Advised may also receive vaccine at local pharmacy or Health Dept. Verbalized acceptance and understanding.  Screening Tests Health Maintenance  Topic Date Due   Hepatitis C Screening  Never done   Zoster Vaccines- Shingrix (1 of 2) Never done   DEXA SCAN  Never done   COVID-19 Vaccine (4 - Booster for Pfizer series) 12/27/2020   COLONOSCOPY (Pts 45-19yrs Insurance coverage will need to be confirmed)  05/25/2026   TETANUS/TDAP  06/07/2031   Pneumonia Vaccine 44+ Years old  Completed   INFLUENZA VACCINE  Completed   HPV VACCINES  Aged Out    Health Maintenance  Health Maintenance Due  Topic Date Due   Hepatitis C Screening  Never done   Zoster Vaccines- Shingrix (1 of 2) Never done   DEXA SCAN  Never done   COVID-19 Vaccine (4 - Booster for Pfizer series) 12/27/2020    Colorectal cancer screening: Type of screening: Colonoscopy. Completed 05/25/2016. Repeat every 10 years  Mammogram status: No longer required due to bilateral Mastectomy .  Bone Density status: Ordered 10/31/2021. Pt provided with contact info and advised to call to schedule appt.  Lung Cancer Screening: (Low Dose CT Chest recommended if Age 77-80 years, 30 pack-year currently smoking OR have quit w/in 15years.) does not qualify.   Lung Cancer Screening Referral: n/a  Additional Screening:  Hepatitis C Screening: does not qualify; Completed n/a  Vision Screening: Recommended annual ophthalmology exams for early detection of glaucoma and other disorders of the eye. Is the patient up to date with their annual eye exam?  No  Who is the provider or what is the name of the office in which the patient attends annual eye exams? Will make appointment  If pt is not established with a provider, would they like to be referred to a provider to establish care? No .   Dental Screening: Recommended annual dental exams for proper oral hygiene  Community Resource Referral / Chronic Care Management: CRR required  this visit?  No   CCM required this visit?  No      Plan:     I have personally reviewed and noted the following in the patient's chart:   Medical and social history Use of alcohol, tobacco or illicit drugs  Current medications and supplements including opioid prescriptions.  Functional ability and status Nutritional status Physical activity Advanced directives List of other physicians Hospitalizations, surgeries, and ER visits in previous 12 months Vitals Screenings to include cognitive, depression, and falls Referrals and appointments  In addition, I have reviewed and discussed with patient certain preventive protocols, quality metrics, and best practice recommendations. A written personalized care plan for preventive services as well as general preventive health recommendations were provided to patient.     Randel Pigg, LPN   [id number]/02/[id number]   Nurse Notes: none

## 2021-11-07 ENCOUNTER — Other Ambulatory Visit: Payer: Self-pay

## 2021-11-07 ENCOUNTER — Encounter: Payer: Self-pay | Admitting: Internal Medicine

## 2021-11-07 ENCOUNTER — Ambulatory Visit (INDEPENDENT_AMBULATORY_CARE_PROVIDER_SITE_OTHER): Payer: Medicare Other | Admitting: Internal Medicine

## 2021-11-07 VITALS — BP 124/76 | HR 91 | Temp 98.2°F | Ht 66.0 in | Wt 134.0 lb

## 2021-11-07 DIAGNOSIS — G8929 Other chronic pain: Secondary | ICD-10-CM | POA: Diagnosis not present

## 2021-11-07 DIAGNOSIS — M25552 Pain in left hip: Secondary | ICD-10-CM | POA: Diagnosis not present

## 2021-11-07 DIAGNOSIS — I639 Cerebral infarction, unspecified: Secondary | ICD-10-CM

## 2021-11-07 DIAGNOSIS — I10 Essential (primary) hypertension: Secondary | ICD-10-CM

## 2021-11-07 DIAGNOSIS — M19011 Primary osteoarthritis, right shoulder: Secondary | ICD-10-CM

## 2021-11-07 MED ORDER — METHYLPREDNISOLONE 4 MG PO TBPK
ORAL_TABLET | ORAL | 0 refills | Status: DC
Start: 2021-11-07 — End: 2021-11-10

## 2021-11-07 MED ORDER — TIZANIDINE HCL 2 MG PO TABS: 2.0000 mg | ORAL_TABLET | Freq: Three times a day (TID) | ORAL | 2 refills | Status: DC | PRN

## 2021-11-07 NOTE — Progress Notes (Signed)
Subjective:  Patient ID: Connie Meyer, female    DOB: 1951-11-08  Age: 70 y.o. MRN: [id number]  CC: Hypertension  This visit occurred during the SARS-CoV-2 public health emergency.  Safety protocols were in place, including screening questions prior to the visit, additional usage of staff PPE, and extensive cleaning of exam room while observing appropriate contact time as indicated for disinfecting solutions.    HPI Connie Meyer presents for f/up -  She continues to c/o of left hip pain (constant aching). The pain does not change with position of activity. Tramadol helps some. Steroids helped significantly. She would like to try a MR. The pain is anterior and posterior, never lateral. There is no trouble with activity.  Outpatient Medications Prior to Visit  Medication Sig Dispense Refill   amLODipine (NORVASC) 10 MG tablet Take 1 tablet (10 mg total) by mouth daily. 90 tablet 1   atorvastatin (LIPITOR) 40 MG tablet Take 1 tablet (40 mg total) by mouth daily at 6 PM. 90 tablet 3   ELIQUIS 5 MG TABS tablet TAKE 1 TABLET(5 MG) BY MOUTH TWICE DAILY 180 tablet 0   fluticasone (FLONASE) 50 MCG/ACT nasal spray SHAKE LIQUID AND USE 1 SPRAY IN EACH NOSTRIL TWICE DAILY 48 g 3   traMADol (ULTRAM) 50 MG tablet Take 1 tablet (50 mg total) by mouth every 6 (six) hours as needed. 270 tablet 1   zolpidem (AMBIEN CR) 6.25 MG CR tablet Take 1 tablet (6.25 mg total) by mouth at bedtime as needed for sleep. 90 tablet 1   benzonatate (TESSALON PERLES) 100 MG capsule Take 1 capsule (100 mg total) by mouth 3 (three) times daily as needed. 20 capsule 0   ondansetron (ZOFRAN ODT) 4 MG disintegrating tablet Take 1 tablet (4 mg total) by mouth every 8 (eight) hours as needed for nausea or vomiting. 10 tablet 0   No facility-administered medications prior to visit.    ROS Review of Systems  Constitutional: Negative.  Negative for chills, diaphoresis and fatigue.  HENT: Negative.    Eyes:  Negative.   Respiratory:  Negative for cough, chest tightness, shortness of breath and wheezing.   Cardiovascular:  Negative for chest pain, palpitations and leg swelling.  Gastrointestinal:  Negative for abdominal pain, constipation, diarrhea and vomiting.  Endocrine: Negative.   Genitourinary: Negative.  Negative for difficulty urinating.  Musculoskeletal:  Positive for arthralgias. Negative for back pain and myalgias.  Neurological: Negative.  Negative for dizziness and weakness.  Hematological:  Negative for adenopathy. Does not bruise/bleed easily.  Psychiatric/Behavioral: Negative.     Objective:  BP 124/76 (BP Location: Right Arm, Patient Position: Sitting, Cuff Size: Large)   Pulse 91   Temp 98.2 F (36.8 C) (Oral)   Ht 5\' 6"  (1.676 m)   Wt 134 lb (60.8 kg)   SpO2 98%   BMI 21.63 kg/m   BP Readings from Last 3 Encounters:  11/07/21 124/76  10/31/21 112/78  09/28/21 136/76    Wt Readings from Last 3 Encounters:  11/07/21 134 lb (60.8 kg)  10/31/21 128 lb (58.1 kg)  09/28/21 133 lb (60.3 kg)    Physical Exam Vitals reviewed.  HENT:     Nose: Nose normal.     Mouth/Throat:     Mouth: Mucous membranes are moist.  Eyes:     Conjunctiva/sclera: Conjunctivae normal.  Cardiovascular:     Rate and Rhythm: Normal rate and regular rhythm.     Heart sounds: No murmur heard. Pulmonary:  Effort: Pulmonary effort is normal.     Breath sounds: No stridor. No wheezing, rhonchi or rales.  Abdominal:     General: Abdomen is flat.     Palpations: There is no mass.     Tenderness: There is no abdominal tenderness. There is no guarding.     Hernia: No hernia is present.  Musculoskeletal:        General: Normal range of motion.     Cervical back: Neck supple.     Right hip: Normal.     Left hip: Normal. No deformity, tenderness, bony tenderness or crepitus. Normal range of motion.     Right lower leg: No edema.     Left lower leg: No edema.  Lymphadenopathy:      Cervical: No cervical adenopathy.  Skin:    General: Skin is warm and dry.     Findings: No rash.  Neurological:     General: No focal deficit present.     Mental Status: She is alert.  Psychiatric:        Mood and Affect: Mood normal.        Behavior: Behavior normal.    Lab Results  Component Value Date   WBC 4.8 07/19/2021   HGB 12.8 07/19/2021   HCT 39.0 07/19/2021   PLT 201.0 07/19/2021   GLUCOSE 96 07/19/2021   CHOL 183 06/06/2021   TRIG 108.0 06/06/2021   HDL 62.10 06/06/2021   LDLCALC 99 06/06/2021   ALT 11 07/19/2021   AST 15 07/19/2021   NA 140 07/19/2021   K 4.8 07/19/2021   CL 104 07/19/2021   CREATININE 0.82 07/19/2021   BUN 18 07/19/2021   CO2 28 07/19/2021   TSH 2.78 06/06/2021   INR 1.0 08/11/2019   HGBA1C 5.9 06/06/2021    CT Abdomen Pelvis W Contrast  Result Date: 08/01/2021 CLINICAL DATA:  70 year old female with left lower quadrant abdominal pain for 1.5 months. History of treated breast cancer. EXAM: CT ABDOMEN AND PELVIS WITH CONTRAST TECHNIQUE: Multidetector CT imaging of the abdomen and pelvis was performed using the standard protocol following bolus administration of intravenous contrast. CONTRAST:  160mL ISOVUE-300 IOPAMIDOL (ISOVUE-300) INJECTION 61% COMPARISON:  CT Abdomen and Pelvis 05/19/2016. FINDINGS: Lower chest: Visible breast implants are stable. There is subxiphoid subcutaneous rectangular metallic device, possibly cardiac loop recorder which is new since 2017. Cardiac size is at the upper limits of normal. No pericardial or pleural effusion. Stable lung bases with mild left lower lobe scarring and a small benign 5 mm nodule in the left costophrenic angle (series 4, image 10). Hepatobiliary: Liver and gallbladder are stable and within normal limits. Pancreas: Negative. Spleen: Negative. Adrenals/Urinary Tract: Normal adrenal glands. Symmetric renal enhancement and contrast excretion. Negative kidneys and proximal ureters. Distal ureters remain  decompressed. Diminutive and unremarkable bladder. Stomach/Bowel: Oral contrast has reached the sigmoid colon without obstruction. There is moderate sigmoid diverticulosis, but no convincing active inflammation. A mildly indistinct appearance of the sigmoid wall is more noticeable than in 2017. The rectum appears negative. Diverticulosis also throughout the descending colon with no active inflammation. Redundant transverse colon with moderate diverticulosis in its midportion. No active inflammation. Negative right colon with diminutive or absent appendix. Negative terminal ileum. No dilated small bowel. Stomach and duodenum are within normal limits. No free air or free fluid. Vascular/Lymphatic: Aortoiliac calcified atherosclerosis. Major arterial structures are patent. No lymphadenopathy. Portal venous system is patent. Reproductive: Chronically absent uterus, diminutive or absent ovaries. Other: No pelvic free fluid.  Musculoskeletal: Mild dextroconvex lumbar scoliosis appears increased since 2017. No acute or suspicious osseous lesion is identified. IMPRESSION: 1. Chronic large bowel redundancy and diverticulosis. Difficult to exclude a mild diverticulitis of the sigmoid colon. But no other acute or inflammatory process identified in the abdomen or pelvis. 2. Aortic Atherosclerosis (ICD10-I70.0). Electronically Signed   By: Genevie Ann M.D.   On: 08/01/2021 10:35   FINDINGS: Pelvic ring is intact. No acute fracture or dislocation is noted. No soft tissue abnormality is seen.   IMPRESSION: No acute abnormality noted.     Electronically Signed   By: Inez Catalina M.D.   On: 09/29/2021 20:32   Assessment & Plan:   Willo was seen today for hypertension.  Diagnoses and all orders for this visit:  Primary hypertension- Her BP is adequately well controlled.  Chronic left hip pain -     methylPREDNISolone (MEDROL DOSEPAK) 4 MG TBPK tablet; TAKE AS DIRECTED -     tiZANidine (ZANAFLEX) 2 MG tablet;  Take 1 tablet (2 mg total) by mouth every 8 (eight) hours as needed for muscle spasms.  Primary osteoarthritis of right shoulder -     methylPREDNISolone (MEDROL DOSEPAK) 4 MG TBPK tablet; TAKE AS DIRECTED -     tiZANidine (ZANAFLEX) 2 MG tablet; Take 1 tablet (2 mg total) by mouth every 8 (eight) hours as needed for muscle spasms.  I have discontinued Connie Meyer "[name]"'s benzonatate and ondansetron. I am also having her start on methylPREDNISolone and tiZANidine. Additionally, I am having her maintain her atorvastatin, fluticasone, traMADol, amLODipine, zolpidem, and Eliquis.  Meds ordered this encounter  Medications   methylPREDNISolone (MEDROL DOSEPAK) 4 MG TBPK tablet    Sig: TAKE AS DIRECTED    Dispense:  21 tablet    Refill:  0   tiZANidine (ZANAFLEX) 2 MG tablet    Sig: Take 1 tablet (2 mg total) by mouth every 8 (eight) hours as needed for muscle spasms.    Dispense:  90 tablet    Refill:  2      Follow-up: Return in about 3 months (around 02/07/2022).  Scarlette Calico, MD

## 2021-11-07 NOTE — Patient Instructions (Signed)
Hip Pain The hip is the joint between the upper legs and the lower pelvis. The bones, cartilage, tendons, and muscles of your hip joint support your body and allow you to move around. Hip pain can range from a minor ache to severe pain in one or both of your hips. The pain may be felt on the inside of the hip joint near the groin, or on the outside near the buttocks and upper thigh. You may also have swelling or stiffness in your hip area. Follow these instructions at home: Managing pain, stiffness, and swelling   If directed, put ice on the painful area. To do this: Put ice in a plastic bag. Place a towel between your skin and the bag. Leave the ice on for 20 minutes, 2-3 times a day. If directed, apply heat to the affected area as often as told by your health care provider. Use the heat source that your health care provider recommends, such as a moist heat pack or a heating pad. Place a towel between your skin and the heat source. Leave the heat on for 20-30 minutes. Remove the heat if your skin turns bright red. This is especially important if you are unable to feel pain, heat, or cold. You may have a greater risk of getting burned. Activity Do exercises as told by your health care provider. Avoid activities that cause pain. General instructions  Take over-the-counter and prescription medicines only as told by your health care provider. Keep a journal of your symptoms. Write down: How often you have hip pain. The location of your pain. What the pain feels like. What makes the pain worse. Sleep with a pillow between your legs on your most comfortable side. Keep all follow-up visits as told by your health care provider. This is important. Contact a health care provider if: You cannot put weight on your leg. Your pain or swelling continues or gets worse after one week. It gets harder to walk. You have a fever. Get help right away if: You fall. You have a sudden increase in pain and  swelling in your hip. Your hip is red or swollen or very tender to touch. Summary Hip pain can range from a minor ache to severe pain in one or both of your hips. The pain may be felt on the inside of the hip joint near the groin, or on the outside near the buttocks and upper thigh. Avoid activities that cause pain. Write down how often you have hip pain, the location of the pain, what makes it worse, and what it feels like. This information is not intended to replace advice given to you by your health care provider. Make sure you discuss any questions you have with your health care provider. Document Revised: 04/28/2019 Document Reviewed: 04/28/2019 Elsevier Patient Education  2022 [employer]  

## 2021-11-10 ENCOUNTER — Other Ambulatory Visit: Payer: Self-pay | Admitting: Internal Medicine

## 2021-11-10 DIAGNOSIS — G8929 Other chronic pain: Secondary | ICD-10-CM

## 2021-11-10 DIAGNOSIS — M25552 Pain in left hip: Secondary | ICD-10-CM

## 2021-11-10 DIAGNOSIS — M19011 Primary osteoarthritis, right shoulder: Secondary | ICD-10-CM

## 2021-11-10 MED ORDER — METHYLPREDNISOLONE 4 MG PO TBPK: ORAL_TABLET | ORAL | 0 refills | Status: DC

## 2021-11-14 ENCOUNTER — Telehealth: Payer: Self-pay | Admitting: Internal Medicine

## 2021-11-14 DIAGNOSIS — M25552 Pain in left hip: Secondary | ICD-10-CM

## 2021-11-14 DIAGNOSIS — G8929 Other chronic pain: Secondary | ICD-10-CM

## 2021-11-14 DIAGNOSIS — M19011 Primary osteoarthritis, right shoulder: Secondary | ICD-10-CM

## 2021-11-14 NOTE — Telephone Encounter (Signed)
1.Medication Requested: methylPREDNISolone (MEDROL DOSEPAK) 4 MG TBPK tablet  2. Pharmacy (Name, Street, [place]): [facility], [address] [place] [facility]  3. On Med List: yes   4. Last Visit with PCP: 11-07-2021  5. Next visit date with PCP: n/a   Agent: Please be advised that RX refills may take up to 3 business days. We ask that you follow-up with your pharmacy.

## 2021-11-15 ENCOUNTER — Other Ambulatory Visit: Payer: Self-pay | Admitting: Internal Medicine

## 2021-11-15 DIAGNOSIS — G8929 Other chronic pain: Secondary | ICD-10-CM | POA: Insufficient documentation

## 2021-11-15 DIAGNOSIS — M25552 Pain in left hip: Secondary | ICD-10-CM

## 2021-11-15 MED ORDER — METHYLPREDNISOLONE 4 MG PO TBPK: ORAL_TABLET | ORAL | 0 refills | Status: DC

## 2021-11-15 NOTE — Telephone Encounter (Signed)
Rx re-sent. Pt informed.

## 2021-11-15 NOTE — Telephone Encounter (Signed)
Patient states the pharmacy never received the order for medication listed below. Requesting it to be resent. Relayed the MRI order was placed to so she should hear back from imaging to get that scheduled.   Please advise.

## 2021-11-21 ENCOUNTER — Ambulatory Visit (INDEPENDENT_AMBULATORY_CARE_PROVIDER_SITE_OTHER): Payer: Medicare Other

## 2021-11-21 DIAGNOSIS — I6300 Cerebral infarction due to thrombosis of unspecified precerebral artery: Secondary | ICD-10-CM | POA: Diagnosis not present

## 2021-11-21 LAB — CUP PACEART REMOTE DEVICE CHECK
Date Time Interrogation Session: 20221121230606
Implantable Pulse Generator Implant Date: 20200818

## 2021-11-29 NOTE — Progress Notes (Signed)
Carelink Summary Report / Loop Recorder 

## 2021-12-01 DIAGNOSIS — Z20822 Contact with and (suspected) exposure to covid-19: Secondary | ICD-10-CM | POA: Diagnosis not present

## 2021-12-20 ENCOUNTER — Ambulatory Visit (INDEPENDENT_AMBULATORY_CARE_PROVIDER_SITE_OTHER): Payer: Medicare Other

## 2021-12-20 DIAGNOSIS — I6300 Cerebral infarction due to thrombosis of unspecified precerebral artery: Secondary | ICD-10-CM | POA: Diagnosis not present

## 2021-12-20 LAB — CUP PACEART REMOTE DEVICE CHECK
Date Time Interrogation Session: 20221224230414
Implantable Pulse Generator Implant Date: 20200818

## 2021-12-24 ENCOUNTER — Other Ambulatory Visit: Payer: Self-pay

## 2021-12-24 ENCOUNTER — Ambulatory Visit
Admission: RE | Admit: 2021-12-24 | Discharge: 2021-12-24 | Disposition: A | Discharge status: Home / Self Care | Payer: Medicare Other | Source: Ambulatory Visit | Attending: Internal Medicine | Admitting: Internal Medicine

## 2021-12-24 DIAGNOSIS — G8929 Other chronic pain: Secondary | ICD-10-CM

## 2021-12-24 DIAGNOSIS — M25552 Pain in left hip: Secondary | ICD-10-CM

## 2021-12-27 ENCOUNTER — Other Ambulatory Visit: Payer: Self-pay | Admitting: Internal Medicine

## 2021-12-27 ENCOUNTER — Telehealth: Payer: Self-pay | Admitting: Internal Medicine

## 2021-12-27 DIAGNOSIS — M24152 Other articular cartilage disorders, left hip: Secondary | ICD-10-CM | POA: Insufficient documentation

## 2021-12-27 NOTE — Telephone Encounter (Signed)
Patient states she does not want referral to  [facility] made today 12-27-2021  Patient requesting referral to: [address]

## 2021-12-27 NOTE — Telephone Encounter (Signed)
° °  Sent to [facility]

## 2021-12-30 NOTE — Progress Notes (Signed)
Carelink Summary Report / Loop Recorder 

## 2022-01-02 DIAGNOSIS — M25552 Pain in left hip: Secondary | ICD-10-CM | POA: Diagnosis not present

## 2022-01-11 DIAGNOSIS — M25552 Pain in left hip: Secondary | ICD-10-CM | POA: Diagnosis not present

## 2022-01-18 ENCOUNTER — Other Ambulatory Visit: Payer: Self-pay | Admitting: Internal Medicine

## 2022-01-18 DIAGNOSIS — I6300 Cerebral infarction due to thrombosis of unspecified precerebral artery: Secondary | ICD-10-CM

## 2022-01-26 DIAGNOSIS — M25552 Pain in left hip: Secondary | ICD-10-CM | POA: Diagnosis not present

## 2022-01-30 ENCOUNTER — Ambulatory Visit (INDEPENDENT_AMBULATORY_CARE_PROVIDER_SITE_OTHER): Payer: Medicare Other

## 2022-01-30 DIAGNOSIS — I6300 Cerebral infarction due to thrombosis of unspecified precerebral artery: Secondary | ICD-10-CM

## 2022-01-31 LAB — CUP PACEART REMOTE DEVICE CHECK
Date Time Interrogation Session: 20230205230612
Implantable Pulse Generator Implant Date: 20200818

## 2022-02-02 NOTE — Telephone Encounter (Signed)
Erroneous encounter. Please disregard.

## 2022-02-02 NOTE — Progress Notes (Signed)
Carelink Summary Report / Loop Recorder 

## 2022-02-03 DIAGNOSIS — M1612 Unilateral primary osteoarthritis, left hip: Secondary | ICD-10-CM | POA: Diagnosis not present

## 2022-02-06 ENCOUNTER — Telehealth: Payer: Self-pay | Admitting: *Deleted

## 2022-02-06 NOTE — Telephone Encounter (Signed)
° °  Pre-operative Risk Assessment    Patient Name: Connie Meyer  DOB: 1950-12-29 MRN: [id number]      Request for Surgical Clearance    Procedure:   LEFT TOTAL HIP ARTHROPLASTY  Date of Surgery:  Clearance 04/18/22                                 Surgeon:  DR. Gaynelle Arabian Surgeon's Group or Practice Name:  [facility] Phone number:  [phone] Fax number:  [phone] ATTN: [place] [facility]   Type of Clearance Requested:   - Medical  - Pharmacy:  Hold Apixaban (Eliquis)     Type of Anesthesia:   CHOICE   Additional requests/questions:    Jiles Prows   02/06/2022, 4:49 PM

## 2022-02-07 ENCOUNTER — Telehealth: Payer: Self-pay

## 2022-02-07 NOTE — Telephone Encounter (Signed)
° °  Name: MACIE BAUM  DOB: Mar 13, 1951  MRN: [id number]  Primary Cardiologist: Dr. Rayann Heman  Chart reviewed as part of pre-operative protocol coverage. Patient has not been seen in our office since 08/2019. Therefore, she will require a follow-up visit in order to better assess preoperative cardiovascular risk.  Pre-op covering staff: - Please schedule appointment and call patient to inform them. If patient already had an upcoming appointment within acceptable timeframe, please add "pre-op clearance" to the appointment notes so provider is aware. - Please contact requesting surgeon's office via preferred method (i.e, phone, fax) to inform them of need for appointment prior to surgery.  Will also route this note to pharmacy pool for recommendations on holding Eliquis. Looks like Eliquis was started after stroke. However, atrial fibrillation was noted on loop recorder in 11/2019.   Darreld Mclean, PA-C  02/07/2022, 7:30 AM

## 2022-02-07 NOTE — Telephone Encounter (Signed)
I will update the surgeon's office that the pt will need an appt with cardiologist for pre op clearance.

## 2022-02-07 NOTE — Telephone Encounter (Signed)
Tried to reach pt again before the end of the night. Left message to call for pre op appt.

## 2022-02-07 NOTE — Telephone Encounter (Signed)
As I have not seen patient since 03/2021 and advised follow-up as needed as she was doing well, unable to give clearance without follow up visit. This clearance can be obtained by cardiology who manages Eliquis but if they need specific clearance from a stroke standpoint then a f/u visit will need to be scheduled. Thank you.

## 2022-02-07 NOTE — Telephone Encounter (Signed)
I have notified Dr. Hector Shade office of NP's recommendation.

## 2022-02-07 NOTE — Telephone Encounter (Signed)
Will send a message to [facility], EP scheduler to reach out the pt with an appt for pre op clearance

## 2022-02-07 NOTE — Telephone Encounter (Signed)
Surgical Clearance form has been received, form placed on NP's desk for review and signature is appropriate.

## 2022-02-07 NOTE — Telephone Encounter (Signed)
Left message for the pt to call back and schedule an appt for pre op clearance. Appt can be with Dr. Rayann Heman or Oda Kilts, PAC or Tommye Standard, [facility]. Surgery is set for 04/18/22.

## 2022-02-08 NOTE — Telephone Encounter (Signed)
2nd attempt.[name] KitchenMarland KitchenLM for pt to call back to schedule appt for pre op clearance.

## 2022-02-08 NOTE — Telephone Encounter (Signed)
See notes from yesterday that we have attempted to reach the pt for a pre op appt.

## 2022-02-08 NOTE — Telephone Encounter (Signed)
Patient with diagnosis of CVA on Eliquis for anticoagulation.    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  No indication of problems since then  Would recommend a 3 day hold as traditionally hip surgery uses spinal anesthesia.  Will need final decision from cardiologist.  (Not sure who this needs to be sent to)

## 2022-02-08 NOTE — Telephone Encounter (Signed)
Covering preop today. As indicated below, patient requires office visit for formalized pre-op evaluation. Preliminary anticoagulation recommendations are now available below for provider to reference in their note if appropriate at that time. Since Dr. Rayann Heman is no longer regularly with our practice, this should be reviewed by provider seeing patient at time of OV. Per office protocol, the provider seeing this patient should forward their finalized clearance decision to requesting party below.

## 2022-02-09 NOTE — Telephone Encounter (Signed)
Left message for the patient to contact the office to schedule a follow up appointment for pre op clearance.

## 2022-02-13 NOTE — Telephone Encounter (Signed)
Per [place] pt is scheduled to see Oda Kilts, [facility] 03/09/22. I will forward notes to [facility] for upcoming appt. I will send FYI to requesting office the pt has appt 03/09/22.

## 2022-02-28 NOTE — Progress Notes (Unsigned)
Electrophysiology Office Note Date: 02/28/2022  ID:  Connie Meyer, DOB 1951-08-21, MRN [id number]  PCP: Janith Lima, MD Primary Cardiologist: None Electrophysiologist: Thompson Grayer, MD  CC: ILR follow-up  Connie Meyer is a 71 y.o. female seen today for Dr. Rayann Heman . she presents today for Cardiac Clearance for Left total Hip Arthroplasty  Since last being seen in our clinic, the patient reports doing very well.  she denies chest pain, palpitations, dyspnea, PND, orthopnea, nausea, vomiting, dizziness, syncope, edema, weight gain, or early satiety.  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 No further therapy. Currently in remission.   HTN (hypertension)    Hypertension    Phreesia 06/21/2020   Hypokalemia 06/14/2019   Impingement syndrome of right shoulder 01/27/2020   Insomnia 09/15/2019   Stroke [facility])    Phreesia 06/21/2020   Past Surgical History:  Procedure Laterality Date   ABDOMINAL HYSTERECTOMY N/A    Phreesia 06/21/2020   BREAST SURGERY     BUBBLE STUDY  06/16/2019   Procedure: BUBBLE STUDY;  Surgeon: Fay Records, MD;  Location: [facility];  Service: Cardiovascular;;   LOOP RECORDER INSERTION N/A 06/16/2019   Procedure: LOOP RECORDER INSERTION;  Surgeon: Evans Lance, MD;  Location: [facility];  Service: Cardiovascular;  Laterality: N/A;   LOOP RECORDER INSERTION N/A 08/12/2019   Procedure: LOOP RECORDER INSERTION;  Surgeon: Thompson Grayer, MD;  Location: [facility];  Service: Cardiovascular;  Laterality: N/A;    TEE WITHOUT CARDIOVERSION N/A 06/16/2019   Procedure: TRANSESOPHAGEAL ECHOCARDIOGRAM (TEE);  Surgeon: Fay Records, MD;  Location: [facility] ENDOSCOPY;  Service: Cardiovascular;  Laterality: N/A;    Current Outpatient Medications  Medication Sig Dispense Refill   amLODipine (NORVASC) 10 MG tablet Take 1 tablet (10 mg total) by mouth daily. 90 tablet 1   atorvastatin (LIPITOR) 40 MG tablet Take 1 tablet (40 mg total) by mouth daily at 6 PM. 90 tablet 3   ELIQUIS 5 MG TABS tablet TAKE 1 TABLET(5 MG) BY MOUTH TWICE DAILY 180 tablet 0   fluticasone (FLONASE) 50 MCG/ACT nasal spray SHAKE LIQUID AND USE 1 SPRAY IN EACH NOSTRIL TWICE DAILY 48 g 3   methylPREDNISolone (MEDROL DOSEPAK) 4 MG TBPK tablet FOLLOW PACKAGE DIRECTIONS 21 each 0   tiZANidine (ZANAFLEX) 2 MG tablet Take 1 tablet (2 mg total) by mouth every 8 (eight) hours as needed for muscle spasms. 90 tablet 2   traMADol (ULTRAM) 50 MG tablet Take 1 tablet (50 mg total) by mouth every 6 (six) hours as needed. 270 tablet 1   zolpidem (AMBIEN CR) 6.25 MG CR tablet Take 1 tablet (6.25 mg total) by mouth at bedtime as needed for sleep. 90 tablet 1   No current facility-administered medications for this visit.    Allergies:   Patient has no known allergies.   Social History: Social History   Socioeconomic History   Marital status: Divorced    Spouse name: Not on file   Number of children: 4   Years of education: 15   Highest education level: Some college,  no degree  Occupational History   Occupation: Retired    Comment: Catering manager  Tobacco Use   Smoking status: Never   Smokeless tobacco: Never  Vaping Use   Vaping Use: Never used  Substance and Sexual Activity   Alcohol use: Yes    Alcohol/week: 1.0 standard drink    Types: 1 Glasses of wine per week   Drug use: No   Sexual activity: Not Currently  Other Topics Concern   Not on file  Social History Narrative   Not on file   Social Determinants of Health   Financial  Resource Strain: Low Risk    Difficulty of Paying Living Expenses: Not hard at all  Food Insecurity: No Food Insecurity   Worried About Charity fundraiser in the Last Year: Never true   [place] in the Last Year: Never true  Transportation Needs: No Transportation Needs   Lack of Transportation (Medical): No   Lack of Transportation (Non-Medical): No  Physical Activity: Inactive   Days of Exercise per Week: 0 days   Minutes of Exercise per Session: 0 min  Stress: No Stress Concern Present   Feeling of Stress : Not at all  Social Connections: Moderately Isolated   Frequency of Communication with Friends and Family: Twice a week   Frequency of Social Gatherings with Friends and Family: Twice a week   Attends Religious Services: More than 4 times per year   Active Member of [employer] or Organizations: No   Attends Music therapist: Never   Marital Status: Divorced  Human resources officer Violence: Not At Risk   Fear of Current or Ex-Partner: No   Emotionally Abused: No   Physically Abused: No   Sexually Abused: No    Family History: Family History  Problem Relation Age of Onset   Hypertension Mother    Hypertension Father      Review of Systems: All other systems reviewed and are otherwise negative except as noted above.  Physical Exam: There were no vitals filed for this visit.   GEN- The patient is well appearing, alert and oriented x 3 today.   HEENT: normocephalic, atraumatic; sclera clear, conjunctiva pink; hearing intact; oropharynx clear; neck supple  Lungs- Clear to ausculation bilaterally, normal work of breathing.  No wheezes, rales, rhonchi Heart- Regular rate and rhythm, no murmurs, rubs or gallops  GI- soft, non-tender, non-distended, bowel sounds present  Extremities- no clubbing, cyanosis, or edema  MS- no significant deformity or atrophy Skin- warm and dry, no rash or lesion; PPM pocket well healed Psych- euthymic mood, full affect Neuro-  strength and sensation are intact  PPM Interrogation- reviewed in detail today,  See PACEART report  EKG:  EKG is ordered today. The ekg ordered today shows ***  Recent Labs: 03/14/2021: B Natriuretic Peptide 78.5 06/06/2021: Magnesium 2.2; TSH 2.78 07/19/2021: ALT 11; BUN 18; Creatinine, Ser 0.82; Hemoglobin 12.8; Platelets 201.0; Potassium 4.8; Sodium 140   Wt Readings from Last 3 Encounters:  11/07/21 134 lb (60.8 kg)  10/31/21 128 lb (58.1 kg)  09/28/21 133 lb (60.3 kg)     Other studies Reviewed: Additional studies/ records that were reviewed today include: Echo *** shows LVEF ***, Previous EP office notes, Previous remote checks, Most recent labwork.   Assessment and Plan:  1. Cryptogenic Stroke s/p Medtronic Loop recorder Normal device function See Pace Art report No changes today  2. Cardiac Clearance for Left Total Hip Echo LVEF 55-60% She does not  have cardiac issues.  AF noted on device was felt to be SR with ectopy.  She is ok to proceed with low risk from cardiac perspective.    Procedure:   LEFT TOTAL HIP ARTHROPLASTY   Date of Surgery:  Clearance 04/18/22                                 Surgeon:  DR. Gaynelle Arabian [facility]geon's Group or Practice Name:  [facility] Phone number:  [phone] Fax number:  [phone] ATTN: [place]  Current medicines are reviewed at length with the patient today.   The patient {ACTIONS; HAS/DOES NOT HAVE:19233} concerns regarding her medicines.  The following changes were made today:  {NONE DEFAULTED:18576}  Labs/ tests ordered today include: *** No orders of the defined types were placed in this encounter.   Disposition:   Follow up with  EP as needed. Will follow loop recorder remotes as previous.  Jacalyn Lefevre, PA-C  02/28/2022 2:03 PM  [place] [id number] [phone] (office) [phone] (fax)

## 2022-03-01 DIAGNOSIS — L299 Pruritus, unspecified: Secondary | ICD-10-CM | POA: Diagnosis not present

## 2022-03-01 DIAGNOSIS — M792 Neuralgia and neuritis, unspecified: Secondary | ICD-10-CM | POA: Diagnosis not present

## 2022-03-01 DIAGNOSIS — B351 Tinea unguium: Secondary | ICD-10-CM | POA: Diagnosis not present

## 2022-03-01 DIAGNOSIS — B353 Tinea pedis: Secondary | ICD-10-CM | POA: Diagnosis not present

## 2022-03-06 ENCOUNTER — Ambulatory Visit (INDEPENDENT_AMBULATORY_CARE_PROVIDER_SITE_OTHER): Payer: Medicare Other

## 2022-03-06 DIAGNOSIS — I6300 Cerebral infarction due to thrombosis of unspecified precerebral artery: Secondary | ICD-10-CM

## 2022-03-07 LAB — CUP PACEART REMOTE DEVICE CHECK
Date Time Interrogation Session: 20230312230540
Implantable Pulse Generator Implant Date: 20200818

## 2022-03-09 ENCOUNTER — Other Ambulatory Visit: Payer: Self-pay

## 2022-03-09 ENCOUNTER — Ambulatory Visit (INDEPENDENT_AMBULATORY_CARE_PROVIDER_SITE_OTHER): Payer: Medicare Other | Admitting: Student

## 2022-03-09 ENCOUNTER — Encounter: Payer: Self-pay | Admitting: Student

## 2022-03-09 VITALS — BP 130/80 | HR 84 | Ht 66.0 in | Wt 128.0 lb

## 2022-03-09 DIAGNOSIS — I639 Cerebral infarction, unspecified: Secondary | ICD-10-CM | POA: Diagnosis not present

## 2022-03-09 DIAGNOSIS — I10 Essential (primary) hypertension: Secondary | ICD-10-CM | POA: Diagnosis not present

## 2022-03-09 DIAGNOSIS — Z01810 Encounter for preprocedural cardiovascular examination: Secondary | ICD-10-CM | POA: Diagnosis not present

## 2022-03-11 DIAGNOSIS — Z20822 Contact with and (suspected) exposure to covid-19: Secondary | ICD-10-CM | POA: Diagnosis not present

## 2022-03-13 DIAGNOSIS — B353 Tinea pedis: Secondary | ICD-10-CM | POA: Diagnosis not present

## 2022-03-13 DIAGNOSIS — B9689 Other specified bacterial agents as the cause of diseases classified elsewhere: Secondary | ICD-10-CM | POA: Diagnosis not present

## 2022-03-13 DIAGNOSIS — L089 Local infection of the skin and subcutaneous tissue, unspecified: Secondary | ICD-10-CM | POA: Diagnosis not present

## 2022-03-20 NOTE — Progress Notes (Signed)
Carelink Summary Report / Loop Recorder 

## 2022-03-29 ENCOUNTER — Ambulatory Visit (INDEPENDENT_AMBULATORY_CARE_PROVIDER_SITE_OTHER): Payer: Medicare Other | Admitting: Internal Medicine

## 2022-03-29 ENCOUNTER — Encounter: Payer: Self-pay | Admitting: Internal Medicine

## 2022-03-29 VITALS — BP 126/74 | HR 83 | Temp 97.8°F | Ht 66.0 in | Wt 129.0 lb

## 2022-03-29 DIAGNOSIS — I639 Cerebral infarction, unspecified: Secondary | ICD-10-CM | POA: Diagnosis not present

## 2022-03-29 DIAGNOSIS — I10 Essential (primary) hypertension: Secondary | ICD-10-CM

## 2022-03-29 DIAGNOSIS — M19011 Primary osteoarthritis, right shoulder: Secondary | ICD-10-CM

## 2022-03-29 DIAGNOSIS — Z23 Encounter for immunization: Secondary | ICD-10-CM

## 2022-03-29 LAB — CBC WITH DIFFERENTIAL/PLATELET
Basophils Absolute: 0 10*3/uL (ref 0.0–0.1)
Basophils Relative: 0.3 % (ref 0.0–3.0)
Eosinophils Absolute: 0.1 10*3/uL (ref 0.0–0.7)
Eosinophils Relative: 2.3 % (ref 0.0–5.0)
HCT: 38.3 % (ref 36.0–46.0)
Hemoglobin: 12.8 g/dL (ref 12.0–15.0)
Lymphocytes Relative: 20.3 % (ref 12.0–46.0)
Lymphs Abs: 1 10*3/uL (ref 0.7–4.0)
MCHC: 33.5 g/dL (ref 30.0–36.0)
MCV: 96.3 fl (ref 78.0–100.0)
Monocytes Absolute: 0.4 10*3/uL (ref 0.1–1.0)
Monocytes Relative: 8.3 % (ref 3.0–12.0)
Neutro Abs: 3.5 10*3/uL (ref 1.4–7.7)
Neutrophils Relative %: 68.8 % (ref 43.0–77.0)
Platelets: 193 10*3/uL (ref 150.0–400.0)
RBC: 3.98 Mil/uL (ref 3.87–5.11)
RDW: 13.9 % (ref 11.5–15.5)
WBC: 5.2 10*3/uL (ref 4.0–10.5)

## 2022-03-29 LAB — COMPREHENSIVE METABOLIC PANEL
ALT: 18 U/L (ref 0–35)
AST: 19 U/L (ref 0–37)
Albumin: 4.6 g/dL (ref 3.5–5.2)
Alkaline Phosphatase: 70 U/L (ref 39–117)
BUN: 16 mg/dL (ref 6–23)
CO2: 28 mEq/L (ref 19–32)
Calcium: 9.7 mg/dL (ref 8.4–10.5)
Chloride: 104 mEq/L (ref 96–112)
Creatinine, Ser: 0.81 mg/dL (ref 0.40–1.20)
GFR: 73.02 mL/min (ref >60.00)
Glucose, Bld: 91 mg/dL (ref 70–99)
Potassium: 4.2 mEq/L (ref 3.5–5.1)
Sodium: 140 mEq/L (ref 135–145)
Total Bilirubin: 0.5 mg/dL (ref 0.2–1.2)
Total Protein: 7.4 g/dL (ref 6.0–8.3)

## 2022-03-29 MED ORDER — SHINGRIX 50 MCG/0.5ML IM SUSR: 0.5000 mL | Freq: Once | INTRAMUSCULAR | 1 refills | Status: AC

## 2022-03-29 NOTE — Patient Instructions (Signed)

## 2022-03-29 NOTE — Progress Notes (Signed)
? ?Subjective:  ?Patient ID: Connie Meyer, female    DOB: Apr 20, 1951  Age: 71 y.o. MRN: [id number] ? ?CC: Hypertension ? ? ?HPI ?Connie Meyer presents for f/up - ? ?She tells me her blood pressure has been well controlled.  She has had chronic hip pain for which she is going to have surgery soon.  She denies chest pain, shortness of breath, or edema. ? ?Outpatient Medications Prior to Visit  ?Medication Sig Dispense Refill  ? amLODipine (NORVASC) 10 MG tablet Take 1 tablet (10 mg total) by mouth daily. 90 tablet 1  ? atorvastatin (LIPITOR) 40 MG tablet Take 1 tablet (40 mg total) by mouth daily at 6 PM. 90 tablet 3  ? ELIQUIS 5 MG TABS tablet TAKE 1 TABLET(5 MG) BY MOUTH TWICE DAILY 180 tablet 0  ? traMADol (ULTRAM) 50 MG tablet Take 1 tablet (50 mg total) by mouth every 6 (six) hours as needed. 270 tablet 1  ? zolpidem (AMBIEN CR) 6.25 MG CR tablet Take 1 tablet (6.25 mg total) by mouth at bedtime as needed for sleep. 90 tablet 1  ? ?No facility-administered medications prior to visit.  ? ? ?ROS ?Review of Systems  ?Constitutional:  Negative for diaphoresis and fatigue.  ?HENT: Negative.    ?Eyes: Negative.   ?Respiratory:  Negative for cough, chest tightness, shortness of breath and wheezing.   ?Cardiovascular:  Negative for chest pain, palpitations and leg swelling.  ?Gastrointestinal:  Negative for abdominal pain, constipation, diarrhea and nausea.  ?Endocrine: Negative.   ?Genitourinary: Negative.   ?Musculoskeletal:  Positive for arthralgias. Negative for back pain and myalgias.  ?Neurological:  Negative for dizziness, weakness and headaches.  ?Hematological:  Negative for adenopathy. Does not bruise/bleed easily.  ?Psychiatric/Behavioral: Negative.    ? ?Objective:  ?BP 126/74 (BP Location: Left Arm, Patient Position: Sitting, Cuff Size: Normal)   Pulse 83   Temp 97.8 ?F (36.6 ?C) (Oral)   Ht '5\' 6"'$  (1.676 m)   Wt 129 lb (58.5 kg)   SpO2 99%   BMI 20.82 kg/m?  ? ?BP Readings from Last 3  Encounters:  ?03/29/22 126/74  ?03/09/22 130/80  ?11/07/21 124/76  ? ? ?Wt Readings from Last 3 Encounters:  ?03/29/22 129 lb (58.5 kg)  ?03/09/22 128 lb (58.1 kg)  ?11/07/21 134 lb (60.8 kg)  ? ? ?Physical Exam ?Vitals reviewed.  ?HENT:  ?   Mouth/Throat:  ?   Mouth: Mucous membranes are moist.  ?Eyes:  ?   General: No scleral icterus. ?   Conjunctiva/sclera: Conjunctivae normal.  ?Cardiovascular:  ?   Rate and Rhythm: Normal rate and regular rhythm.  ?   Heart sounds: No murmur heard. ?Pulmonary:  ?   Effort: Pulmonary effort is normal.  ?   Breath sounds: No stridor. No wheezing, rhonchi or rales.  ?Abdominal:  ?   General: Abdomen is flat.  ?   Palpations: There is no mass.  ?   Tenderness: There is no abdominal tenderness. There is no guarding.  ?   Hernia: No hernia is present.  ?Musculoskeletal:     ?   General: Normal range of motion.  ?   Cervical back: Neck supple.  ?   Right lower leg: No edema.  ?   Left lower leg: No edema.  ?Lymphadenopathy:  ?   Cervical: No cervical adenopathy.  ?Skin: ?   General: Skin is warm and dry.  ?Neurological:  ?   General: No focal deficit present.  ?  Mental Status: She is alert.  ?Psychiatric:     ?   Mood and Affect: Mood normal.     ?   Behavior: Behavior normal.  ? ? ?Lab Results  ?Component Value Date  ? WBC 5.2 03/29/2022  ? HGB 12.8 03/29/2022  ? HCT 38.3 03/29/2022  ? PLT 193.0 03/29/2022  ? GLUCOSE 91 03/29/2022  ? CHOL 183 06/06/2021  ? TRIG 108.0 06/06/2021  ? HDL 62.10 06/06/2021  ? [place] 99 06/06/2021  ? ALT 18 03/29/2022  ? AST 19 03/29/2022  ? NA 140 03/29/2022  ? K 4.2 03/29/2022  ? CL 104 03/29/2022  ? CREATININE 0.81 03/29/2022  ? BUN 16 03/29/2022  ? CO2 28 03/29/2022  ? TSH 2.78 06/06/2021  ? INR 1.0 08/11/2019  ? HGBA1C 5.9 06/06/2021  ? ? ?MR HIP LEFT WO CONTRAST ? ?Result Date: 12/27/2021 ?CLINICAL DATA:  Chronic left hip pain. EXAM: MR OF THE LEFT HIP WITHOUT CONTRAST TECHNIQUE: Multiplanar, multisequence MR imaging was performed. No intravenous  contrast was administered. COMPARISON:  None. FINDINGS: Bones: No hip fracture, dislocation or avascular necrosis. No periosteal reaction or bone destruction. No aggressive osseous lesion. Normal sacrum and sacroiliac joints. No SI joint widening or erosive changes. Degenerative disease with disc height loss at L3-4 and L4-5. Articular cartilage and labrum Articular cartilage: Partial-thickness cartilage loss of the left femoral head and acetabulum. 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 Joint or bursal effusion Joint effusion:  No hip joint effusion.  No SI joint effusion. Bursae: No bursa formation. Muscles and tendons Flexors: Normal. Extensors: Normal. Abductors: Normal. Adductors: Normal. Gluteals: Normal. Hamstrings: Mild tendinosis of the left hamstring origin with a tiny partial thickness tear. Other findings No pelvic free fluid. No fluid collection or hematoma. No inguinal lymphadenopathy. No inguinal hernia. IMPRESSION: 1. Mild osteoarthritis of the left hip. 2. 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 3. Mild tendinosis of the left hamstring origin with a tiny partial thickness tear. Electronically Signed   By: Kathreen Devoid M.D.   On: 12/27/2021 08:57  ? ? ?Assessment & Plan:  ? ?Elexis was seen today for hypertension. ? ?Diagnoses and all orders for this visit: ? ?Primary hypertension- Her blood pressure is adequately well controlled.  Her labs are normal.  Will continue the current dose of amlodipine. ?-     CBC with Differential/Platelet; Future ?-     Comprehensive metabolic panel; Future ?-     Comprehensive metabolic panel ?-     CBC with Differential/Platelet ? ?Primary osteoarthritis of right shoulder ? ?Need for shingles vaccine ?-     Zoster Vaccine Adjuvanted [facility]) injection; Inject 0.5 mLs into the muscle once for 1 dose. ? ? ?I am having Connie Meyer "[name]" start on Shingrix. I am also having her maintain her atorvastatin,  traMADol, amLODipine, zolpidem, and Eliquis. ? ?Meds ordered this encounter  ?Medications  ? Zoster Vaccine Adjuvanted [facility]) injection  ?  Sig: Inject 0.5 mLs into the muscle once for 1 dose.  ?  Dispense:  0.5 mL  ?  Refill:  1  ? ? ? ?Follow-up: Return in about 6 months (around 09/28/2022). ? ?Scarlette Calico, MD ?

## 2022-03-31 ENCOUNTER — Other Ambulatory Visit: Payer: Self-pay | Admitting: Internal Medicine

## 2022-03-31 DIAGNOSIS — F5104 Psychophysiologic insomnia: Secondary | ICD-10-CM

## 2022-04-03 ENCOUNTER — Telehealth: Payer: Self-pay

## 2022-04-03 ENCOUNTER — Other Ambulatory Visit: Payer: Self-pay | Admitting: Internal Medicine

## 2022-04-03 NOTE — Telephone Encounter (Signed)
Pt is calling questioning why refill was denied and she in to see Dr. Ronnald Ramp on 03/29/22. ? ?Refill requesting is  ?zolpidem (AMBIEN CR) 6.25 MG CR tablet ? ?Pharmacy: ?[id number] - [place], [place] ? ?LOV 03/29/22 ?ROV 09/28/22 ?

## 2022-04-03 NOTE — Telephone Encounter (Signed)
Pharmacy told her that it was denied ?

## 2022-04-07 ENCOUNTER — Other Ambulatory Visit: Payer: Self-pay | Admitting: Internal Medicine

## 2022-04-07 DIAGNOSIS — I10 Essential (primary) hypertension: Secondary | ICD-10-CM

## 2022-04-10 ENCOUNTER — Ambulatory Visit (INDEPENDENT_AMBULATORY_CARE_PROVIDER_SITE_OTHER): Payer: Medicare Other

## 2022-04-10 DIAGNOSIS — Z20822 Contact with and (suspected) exposure to covid-19: Secondary | ICD-10-CM | POA: Diagnosis not present

## 2022-04-10 DIAGNOSIS — I639 Cerebral infarction, unspecified: Secondary | ICD-10-CM | POA: Diagnosis not present

## 2022-04-12 LAB — CUP PACEART REMOTE DEVICE CHECK
Date Time Interrogation Session: 20230414230641
Implantable Pulse Generator Implant Date: 20200818

## 2022-04-18 DIAGNOSIS — M1612 Unilateral primary osteoarthritis, left hip: Secondary | ICD-10-CM | POA: Diagnosis not present

## 2022-04-20 ENCOUNTER — Other Ambulatory Visit: Payer: Self-pay | Admitting: Internal Medicine

## 2022-04-20 DIAGNOSIS — I6300 Cerebral infarction due to thrombosis of unspecified precerebral artery: Secondary | ICD-10-CM

## 2022-04-27 NOTE — Progress Notes (Signed)
Carelink Summary Report / Loop Recorder 

## 2022-04-29 DIAGNOSIS — Z20822 Contact with and (suspected) exposure to covid-19: Secondary | ICD-10-CM | POA: Diagnosis not present

## 2022-05-14 LAB — CUP PACEART REMOTE DEVICE CHECK
Date Time Interrogation Session: 20230517231138
Implantable Pulse Generator Implant Date: 20200818

## 2022-05-15 ENCOUNTER — Ambulatory Visit (INDEPENDENT_AMBULATORY_CARE_PROVIDER_SITE_OTHER): Payer: Medicare Other

## 2022-05-15 DIAGNOSIS — I639 Cerebral infarction, unspecified: Secondary | ICD-10-CM

## 2022-05-26 DIAGNOSIS — Z5189 Encounter for other specified aftercare: Secondary | ICD-10-CM | POA: Diagnosis not present

## 2022-05-31 NOTE — Progress Notes (Signed)
Carelink Summary Report / Loop Recorder 

## 2022-06-26 ENCOUNTER — Encounter: Payer: Self-pay | Admitting: Internal Medicine

## 2022-06-26 ENCOUNTER — Ambulatory Visit (INDEPENDENT_AMBULATORY_CARE_PROVIDER_SITE_OTHER): Payer: Medicare Other | Admitting: Internal Medicine

## 2022-06-26 VITALS — BP 138/72 | HR 88 | Temp 98.9°F | Ht 66.0 in | Wt 130.0 lb

## 2022-06-26 DIAGNOSIS — I10 Essential (primary) hypertension: Secondary | ICD-10-CM

## 2022-06-26 DIAGNOSIS — E2839 Other primary ovarian failure: Secondary | ICD-10-CM

## 2022-06-26 DIAGNOSIS — I639 Cerebral infarction, unspecified: Secondary | ICD-10-CM | POA: Diagnosis not present

## 2022-06-26 DIAGNOSIS — M19011 Primary osteoarthritis, right shoulder: Secondary | ICD-10-CM | POA: Diagnosis not present

## 2022-06-26 DIAGNOSIS — F5104 Psychophysiologic insomnia: Secondary | ICD-10-CM | POA: Diagnosis not present

## 2022-06-26 MED ORDER — ZOLPIDEM TARTRATE ER 6.25 MG PO TBCR: EXTENDED_RELEASE_TABLET | ORAL | 1 refills | Status: DC

## 2022-06-26 MED ORDER — TRAMADOL HCL 50 MG PO TABS: 50.0000 mg | ORAL_TABLET | Freq: Four times a day (QID) | ORAL | 1 refills | Status: DC | PRN

## 2022-06-26 NOTE — Patient Instructions (Signed)
Hypertension, Adult High blood pressure (hypertension) is when the force of blood pumping through the arteries is too strong. The arteries are the blood vessels that carry blood from the heart throughout the body. Hypertension forces the heart to work harder to pump blood and may cause arteries to become narrow or stiff. Untreated or uncontrolled hypertension can lead to a heart attack, heart failure, a stroke, kidney disease, and other problems. A blood pressure reading consists of a higher number over a lower number. Ideally, your blood pressure should be below 120/80. The first ("top") number is called the systolic pressure. It is a measure of the pressure in your arteries as your heart beats. The second ("bottom") number is called the diastolic pressure. It is a measure of the pressure in your arteries as the heart relaxes. What are the causes? The exact cause of this condition is not known. There are some conditions that result in high blood pressure. What increases the risk? Certain factors may make you more likely to develop high blood pressure. Some of these risk factors are under your control, including: Smoking. Not getting enough exercise or physical activity. Being overweight. Having too much fat, sugar, calories, or salt (sodium) in your diet. Drinking too much alcohol. Other risk factors include: Having a personal history of heart disease, diabetes, high cholesterol, or kidney disease. Stress. Having a family history of high blood pressure and high cholesterol. Having obstructive sleep apnea. Age. The risk increases with age. What are the signs or symptoms? High blood pressure may not cause symptoms. Very high blood pressure (hypertensive crisis) may cause: Headache. Fast or irregular heartbeats (palpitations). Shortness of breath. Nosebleed. Nausea and vomiting. Vision changes. Severe chest pain, dizziness, and seizures. How is this diagnosed? This condition is diagnosed by  measuring your blood pressure while you are seated, with your arm resting on a flat surface, your legs uncrossed, and your feet flat on the floor. The cuff of the blood pressure monitor will be placed directly against the skin of your upper arm at the level of your heart. Blood pressure should be measured at least twice using the same arm. Certain conditions can cause a difference in blood pressure between your right and left arms. If you have a high blood pressure reading during one visit or you have normal blood pressure with other risk factors, you may be asked to: Return on a different day to have your blood pressure checked again. Monitor your blood pressure at home for 1 week or longer. If you are diagnosed with hypertension, you may have other blood or imaging tests to help your health care provider understand your overall risk for other conditions. How is this treated? This condition is treated by making healthy lifestyle changes, such as eating healthy foods, exercising more, and reducing your alcohol intake. You may be referred for counseling on a healthy diet and physical activity. Your health care provider may prescribe medicine if lifestyle changes are not enough to get your blood pressure under control and if: Your systolic blood pressure is above 130. Your diastolic blood pressure is above 80. Your personal target blood pressure may vary depending on your medical conditions, your age, and other factors. Follow these instructions at home: Eating and drinking  Eat a diet that is high in fiber and potassium, and low in sodium, added sugar, and fat. An example of this eating plan is called the DASH diet. DASH stands for Dietary Approaches to Stop Hypertension. To eat this way: Eat   plenty of fresh fruits and vegetables. Try to fill one half of your plate at each meal with fruits and vegetables. Eat whole grains, such as whole-wheat pasta, brown rice, or whole-grain bread. Fill about one  fourth of your plate with whole grains. Eat or drink low-fat dairy products, such as skim milk or low-fat yogurt. Avoid fatty cuts of meat, processed or cured meats, and poultry with skin. Fill about one fourth of your plate with lean proteins, such as fish, chicken without skin, beans, eggs, or tofu. Avoid pre-made and processed foods. These tend to be higher in sodium, added sugar, and fat. Reduce your daily sodium intake. Many people with hypertension should eat less than 1,500 mg of sodium a day. Do not drink alcohol if: Your health care provider tells you not to drink. You are pregnant, may be pregnant, or are planning to become pregnant. If you drink alcohol: Limit how much you have to: 0-1 drink a day for women. 0-2 drinks a day for men. Know how much alcohol is in your drink. In the [place] one drink equals one 12 oz bottle of beer (355 mL), one 5 oz glass of wine (148 mL), or one 1 oz glass of hard liquor (44 mL). Lifestyle  Work with your health care provider to maintain a healthy body weight or to lose weight. Ask what an ideal weight is for you. Get at least 30 minutes of exercise that causes your heart to beat faster (aerobic exercise) most days of the week. Activities may include walking, swimming, or biking. Include exercise to strengthen your muscles (resistance exercise), such as Pilates or lifting weights, as part of your weekly exercise routine. Try to do these types of exercises for 30 minutes at least 3 days a week. Do not use any products that contain nicotine or tobacco. These products include cigarettes, chewing tobacco, and vaping devices, such as e-cigarettes. If you need help quitting, ask your health care provider. Monitor your blood pressure at home as told by your health care provider. Keep all follow-up visits. This is important. Medicines Take over-the-counter and prescription medicines only as told by your health care provider. Follow directions carefully. Blood  pressure medicines must be taken as prescribed. Do not skip doses of blood pressure medicine. Doing this puts you at risk for problems and can make the medicine less effective. Ask your health care provider about side effects or reactions to medicines that you should watch for. Contact a health care provider if you: Think you are having a reaction to a medicine you are taking. Have headaches that keep coming back (recurring). Feel dizzy. Have swelling in your ankles. Have trouble with your vision. Get help right away if you: Develop a severe headache or confusion. Have unusual weakness or numbness. Feel faint. Have severe pain in your chest or abdomen. Vomit repeatedly. Have trouble breathing. These symptoms may be an emergency. Get help right away. Call 911. Do not wait to see if the symptoms will go away. Do not drive yourself to the hospital. Summary Hypertension is when the force of blood pumping through your arteries is too strong. If this condition is not controlled, it may put you at risk for serious complications. Your personal target blood pressure may vary depending on your medical conditions, your age, and other factors. For most people, a normal blood pressure is less than 120/80. Hypertension is treated with lifestyle changes, medicines, or a combination of both. Lifestyle changes include losing weight, eating a healthy,   low-sodium diet, exercising more, and limiting alcohol. This information is not intended to replace advice given to you by your health care provider. Make sure you discuss any questions you have with your health care provider. Document Revised: 10/18/2021 Document Reviewed: 10/18/2021 Elsevier Patient Education  2023 Elsevier Inc.  

## 2022-06-27 NOTE — Progress Notes (Signed)
Subjective:  Patient ID: Connie Meyer, female    DOB: 09/30/1951  Age: 71 y.o. MRN: [id number]  CC: Hypertension   HPI Connie Meyer presents for f/up - She has pain in her large joints.  She is getting symptom relief with tramadol.  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  She continues to feel stressed but does not want to take an antidepressant.  She denies SI or HI.  Outpatient Medications Prior to Visit  Medication Sig Dispense Refill   amLODipine (NORVASC) 10 MG tablet TAKE 1 TABLET(10 MG) BY MOUTH DAILY 90 tablet 1   atorvastatin (LIPITOR) 40 MG tablet Take 1 tablet (40 mg total) by mouth daily at 6 PM. 90 tablet 3   ELIQUIS 5 MG TABS tablet TAKE 1 TABLET(5 MG) BY MOUTH TWICE DAILY 180 tablet 0   traMADol (ULTRAM) 50 MG tablet Take 1 tablet (50 mg total) by mouth every 6 (six) hours as needed. 270 tablet 1   zolpidem (AMBIEN CR) 6.25 MG CR tablet TAKE 1 TABLET(6.25 MG) BY MOUTH AT BEDTIME AS NEEDED FOR SLEEP 90 tablet 1   No facility-administered medications prior to visit.    ROS Review of Systems  Constitutional: Negative.  Negative for appetite change, diaphoresis, fatigue and unexpected weight change.  HENT: Negative.    Eyes: Negative.   Respiratory:  Negative for cough, chest tightness, shortness of breath and wheezing.   Cardiovascular:  Negative for chest pain, palpitations and leg swelling.  Gastrointestinal:  Negative for abdominal pain, constipation, diarrhea, nausea and vomiting.  Endocrine: Negative.   Genitourinary: Negative.  Negative for difficulty urinating.  Musculoskeletal:  Positive for arthralgias. Negative for back pain.  Skin: Negative.  Negative for color change.  Neurological:  Negative for dizziness, weakness, light-headedness and headaches.  Hematological:  Negative for adenopathy. Does not bruise/bleed easily.   Psychiatric/Behavioral:  Positive for dysphoric mood and sleep disturbance. Negative for behavioral problems, confusion, decreased concentration, self-injury and suicidal ideas. The patient is not nervous/anxious.     Objective:  BP 138/72 (BP Location: Left Arm, Patient Position: Sitting, Cuff Size: Large)   Pulse 88   Temp 98.9 F (37.2 C) (Oral)   Ht '5\' 6"'$  (1.676 m)   Wt 130 lb (59 kg)   SpO2 97%   BMI 20.98 kg/m   BP Readings from Last 3 Encounters:  06/26/22 138/72  03/29/22 126/74  03/09/22 130/80    Wt Readings from Last 3 Encounters:  06/26/22 130 lb (59 kg)  03/29/22 129 lb (58.5 kg)  03/09/22 128 lb (58.1 kg)    Physical Exam Vitals reviewed.  HENT:     Mouth/Throat:     Mouth: Mucous membranes are moist.  Eyes:     General: No scleral icterus.    Conjunctiva/sclera: Conjunctivae normal.  Cardiovascular:     Rate and Rhythm: Normal rate and regular rhythm.     Heart sounds: No murmur heard. Pulmonary:     Effort: Pulmonary effort is normal.     Breath sounds: No stridor. No wheezing, rhonchi or rales.  Abdominal:     General: Abdomen is flat.     Palpations: There is no mass.     Tenderness: There is no abdominal tenderness.     Hernia: No hernia is present.  Musculoskeletal:     Cervical back: Neck supple.  Lymphadenopathy:  Cervical: No cervical adenopathy.  Skin:    General: Skin is warm and dry.  Neurological:     General: No focal deficit present.     Mental Status: She is alert.  Psychiatric:        Mood and Affect: Mood normal.        Behavior: Behavior normal.        Thought Content: Thought content normal.        Judgment: Judgment normal.     Lab Results  Component Value Date   WBC 5.2 03/29/2022   HGB 12.8 03/29/2022   HCT 38.3 03/29/2022   PLT 193.0 03/29/2022   GLUCOSE 91 03/29/2022   CHOL 183 06/06/2021   TRIG 108.0 06/06/2021   HDL 62.10 06/06/2021   LDLCALC 99 06/06/2021   ALT 18 03/29/2022   AST 19 03/29/2022    NA 140 03/29/2022   K 4.2 03/29/2022   CL 104 03/29/2022   CREATININE 0.81 03/29/2022   BUN 16 03/29/2022   CO2 28 03/29/2022   TSH 2.78 06/06/2021   INR 1.0 08/11/2019   HGBA1C 5.9 06/06/2021    MR HIP LEFT WO CONTRAST  Result Date: 12/27/2021 CLINICAL DATA:  Chronic left hip pain. EXAM: MR OF THE LEFT HIP WITHOUT CONTRAST TECHNIQUE: Multiplanar, multisequence MR imaging was performed. No intravenous contrast was administered. COMPARISON:  None. FINDINGS: Bones: No hip fracture, dislocation or avascular necrosis. No periosteal reaction or bone destruction. No aggressive osseous lesion. Normal sacrum and sacroiliac joints. No SI joint widening or erosive changes. Degenerative disease with disc height loss at L3-4 and L4-5. Articular cartilage and labrum Articular cartilage: Partial-thickness cartilage loss of the left femoral head and acetabulum. 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 Joint or bursal effusion Joint effusion:  No hip joint effusion.  No SI joint effusion. Bursae: No bursa formation. Muscles and tendons Flexors: Normal. Extensors: Normal. Abductors: Normal. Adductors: Normal. Gluteals: Normal. Hamstrings: Mild tendinosis of the left hamstring origin with a tiny partial thickness tear. Other findings No pelvic free fluid. No fluid collection or hematoma. No inguinal lymphadenopathy. No inguinal hernia. IMPRESSION: 1. Mild osteoarthritis of the left hip. 2. 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 3. Mild tendinosis of the left hamstring origin with a tiny partial thickness tear. Electronically Signed   By: Kathreen Devoid M.D.   On: 12/27/2021 08:57    Assessment & Plan:   Connie Meyer was seen today for hypertension.  Diagnoses and all orders for this visit:  Primary hypertension- Her blood pressure is well controlled.  Psychophysiological insomnia -     zolpidem (AMBIEN CR) 6.25 MG CR tablet; TAKE 1 TABLET(6.25 MG) BY MOUTH AT  BEDTIME AS NEEDED FOR SLEEP  Primary osteoarthritis of right shoulder -     traMADol (ULTRAM) 50 MG tablet; Take 1 tablet (50 mg total) by mouth every 6 (six) hours as needed.  Estrogen deficiency -     DG Bone Density; Future   I am having Connie Meyer "Connie Meyer" maintain her atorvastatin, amLODipine, Eliquis, zolpidem, and traMADol.  Meds ordered this encounter  Medications   zolpidem (AMBIEN CR) 6.25 MG CR tablet    Sig: TAKE 1 TABLET(6.25 MG) BY MOUTH AT BEDTIME AS NEEDED FOR SLEEP    Dispense:  90 tablet    Refill:  1   traMADol (ULTRAM) 50 MG tablet    Sig: Take 1 tablet (50 mg total) by mouth every 6 (six) hours as needed.  Dispense:  270 tablet    Refill:  1     Follow-up: Return in about 3 months (around 09/26/2022).  Scarlette Calico, MD

## 2022-07-20 ENCOUNTER — Ambulatory Visit: Payer: Medicare Other

## 2022-07-20 ENCOUNTER — Telehealth: Payer: Self-pay

## 2022-07-20 LAB — CUP PACEART REMOTE DEVICE CHECK
Date Time Interrogation Session: 20230722231031
Implantable Pulse Generator Implant Date: 20200818

## 2022-07-20 NOTE — Telephone Encounter (Signed)
Patient's last AWV was done 10/31/2021; advised patient that it is done every 366 days.  Appt for today was cancelled.

## 2022-07-24 ENCOUNTER — Ambulatory Visit (INDEPENDENT_AMBULATORY_CARE_PROVIDER_SITE_OTHER): Payer: Medicare Other

## 2022-07-24 DIAGNOSIS — I639 Cerebral infarction, unspecified: Secondary | ICD-10-CM

## 2022-08-04 ENCOUNTER — Encounter: Payer: Self-pay | Admitting: Physician Assistant

## 2022-08-04 ENCOUNTER — Ambulatory Visit (INDEPENDENT_AMBULATORY_CARE_PROVIDER_SITE_OTHER): Payer: Medicare Other | Admitting: Physician Assistant

## 2022-08-04 VITALS — BP 130/68 | HR 74 | Ht 65.0 in | Wt 129.0 lb

## 2022-08-04 DIAGNOSIS — Z4509 Encounter for adjustment and management of other cardiac device: Secondary | ICD-10-CM | POA: Diagnosis not present

## 2022-08-04 DIAGNOSIS — I639 Cerebral infarction, unspecified: Secondary | ICD-10-CM | POA: Diagnosis not present

## 2022-08-04 DIAGNOSIS — I10 Essential (primary) hypertension: Secondary | ICD-10-CM | POA: Diagnosis not present

## 2022-08-04 LAB — CUP PACEART INCLINIC DEVICE CHECK
Date Time Interrogation Session: 20230811133811
Implantable Pulse Generator Implant Date: 20200818

## 2022-08-04 NOTE — Patient Instructions (Addendum)
Medication Instructions:   Your physician recommends that you continue on your current medications as directed. Please refer to the Current Medication list given to you today.  *If you need a refill on your cardiac medications before your next appointment, please call your pharmacy*   Lab Work: [place]   If you have labs (blood work) drawn today and your tests are completely normal, you will receive your results only by: [place] (if you have MyChart) OR A paper copy in the mail If you have any lab test that is abnormal or we need to change your treatment, we will call you to review the results.   Testing/Procedures: NONE ORDERED  TODAY   Follow-Up: At [facility], you and your health needs are our priority.  As part of our continuing mission to provide you with exceptional heart care, we have created designated Provider Care Teams.  These Care Teams include your primary Cardiologist (physician) and Advanced Practice Providers (APPs -  Physician Assistants and Nurse Practitioners) who all work together to provide you with the care you need, when you need it.  We recommend signing up for the patient portal called "MyChart".  Sign up information is provided on this After Visit Summary.  MyChart is used to connect with patients for Virtual Visits (Telemedicine).  Patients are able to view lab/test results, encounter notes, upcoming appointments, etc.  Non-urgent messages can be sent to your provider as well.   To learn more about what you can do with MyChart, go to [email or web address].    Your next appointment:   1 YEAR  The format for your next appointment:   In Person  Provider:   Allegra Lai, MD   Other Instructions   Important Information About Sugar

## 2022-08-04 NOTE — Progress Notes (Signed)
Cardiology Office Note Date:  08/04/2022  Patient ID:  Connie, Meyer [birth year], MRN [id number] PCP:  Janith Lima, MD  Cardiologist:  Dr. Rayann Heman     Chief Complaint:  wants to discuss loop removal  History of Present Illness: Connie Meyer is a 71 y.o. female with history of HTN, breast cancer, and cryptogenic stroke.    She suffered cryptogenic stroke June 2020, at that stay she had loop recorder implanted > strangely the loop apparently migrated out and was lost.  Unfortunately she had a second cryptogenic stroke 08/12/2019 and another loop was implanted though inferiorly noting that the skin superior to breasts was quite thin/fragil 2/2 radiation therapy.  Ultimately neurology decided to stop her enrollment in [place] study (apixaban 2.5 mg versus placebo) and started her on Eliquis.  I saw her 08/26/2019 Site looks great, no issues or concerns.  She is doing well, having trouble emotionally given yet another stroke, she is worried about getting back to her exercise (and life).  Outside if this, she is doing well without any symptoms.   She is encouraged to have follow up with neurology and her PMD to discuss her fears regarding resuming exercise, and likely some degree of depression for their recommendations and guidance. She has noticed some mild bleeding of her gums Site was well healed  Most recently she saw A. Tillery, PA-C pending hip surgery, no AFib to date with false episodes 2/2 PACs.  Felt reasonable cardiac risk for surgery, to continue PRN  visit  TODAY She is doing well No CP, palpitations, SOB No symptoms or concerns. No bleeding or signs of bleeding.  She can not continue to afford monthly monitoring of her device and would like to discuss having it removed. It is not bothering her, no pain, skin irritation.  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 No further therapy. Currently in remission.   HTN (hypertension)    Hypertension    Phreesia 06/21/2020   Hypokalemia 06/14/2019   Impingement syndrome of right shoulder 01/27/2020   Insomnia 09/15/2019   Stroke [facility])    Phreesia 06/21/2020    Past Surgical History:  Procedure Laterality Date   ABDOMINAL HYSTERECTOMY N/A    Phreesia 06/21/2020   BREAST SURGERY     BUBBLE STUDY  06/16/2019   Procedure: BUBBLE STUDY;  Surgeon: Fay Records, MD;  Location: [place];  Service: Cardiovascular;;   LOOP RECORDER INSERTION N/A 06/16/2019   Procedure: LOOP RECORDER INSERTION;  Surgeon: Evans Lance, MD;  Location: [place] CV LAB;  Service: Cardiovascular;  Laterality: N/A;   LOOP RECORDER INSERTION N/A 08/12/2019   Procedure: LOOP RECORDER INSERTION;  Surgeon: Thompson Grayer, MD;  Location: [place] [facility];  Service: Cardiovascular;  Laterality: N/A;   TEE WITHOUT CARDIOVERSION N/A 06/16/2019   Procedure: TRANSESOPHAGEAL ECHOCARDIOGRAM (TEE);  Surgeon: Fay Records, MD;  Location[facility] ENDOSCOPY;  Service: Cardiovascular;  Laterality: N/A;    Current Outpatient Medications  Medication Sig Dispense Refill   amLODipine (NORVASC) 10 MG tablet TAKE 1 TABLET(10 MG)  BY MOUTH DAILY 90 tablet 1   atorvastatin (LIPITOR) 40 MG tablet Take 1 tablet (40 mg total) by mouth daily at 6 PM. 90 tablet 3   ELIQUIS 5 MG TABS tablet TAKE 1 TABLET(5 MG) BY MOUTH TWICE DAILY 180 tablet 0   traMADol (ULTRAM) 50 MG tablet Take 1 tablet (50 mg total) by mouth every 6 (six) hours as needed. 270 tablet 1   zolpidem (AMBIEN CR) 6.25 MG CR tablet TAKE 1  TABLET(6.25 MG) BY MOUTH AT BEDTIME AS NEEDED FOR SLEEP 90 tablet 1   No current facility-administered medications for this visit.    Allergies:   Patient has no known allergies.   Social History:  The patient  reports that she has never smoked. She has never used smokeless tobacco. She reports current alcohol use of about 1.0 standard drink of alcohol per week. She reports that she does not use drugs.   Family History:  The patient's family history includes Hypertension in her father and mother.  ROS:  Please see the history of present illness.   All other systems are reviewed and otherwise negative.     PHYSICAL EXAM:  VS:  There were no vitals taken for this visit. BMI: There is no height or weight on file to calculate BMI. Well nourished, well developed, in no acute distress  HEENT: normocephalic, atraumatic  Neck: no JVD, carotid bruits or masses Cardiac:  RRR; no significant murmurs, no rubs, or gallops Lungs:  CTA b/l, no wheezing, rhonchi or rales  Abd: soft, nontender MS: no deformity or atrophy Ext: no edema  Skin: warm and dry, no rash Neuro:  No gross deficits appreciated Psych: euthymic mood, full affect  ILR site (left, inframammary) is stable.   EKG:  Not done today   ILR interrogation done today and reviewed by myself:  Battery is good R waves 0.22 No AFib Last logged episode Oct 2021 and all priors have been reviewed and felt to be false episodes for ectopy. No true Afib has been identified   06/14/2019 TTE   IMPRESSIONS  1. The left ventricle has normal systolic function, with an ejection fraction of 55-60%. The cavity size was normal. Left ventricular diastolic Doppler parameters are consistent with impaired relaxation. Indeterminate filling pressures The E/e' is 8-15.  2. The right ventricle has normal systolic function. The cavity was normal. There is no increase in right ventricular wall thickness.  3. The mitral valve is grossly normal.  4. The  tricuspid valve is grossly normal.  5. The aortic valve is grossly normal. No stenosis of the aortic valve.     06/16/2019 : TEE IMPRESSIONS 1. The left ventricle has normal systolic function, with an ejection fraction of 55-60%. The cavity size was normal.  2. The right ventricle has normal systolc function.  3. LA, LAA without masses.  4. With injection of agitated saline a few bubbles appeared late in left atrium consistent with intrapulmonary shunting (small).  5. The aortic valve is tricuspid Aortic valve regurgitation is trivial by color flow Doppler.  6. Minimal fixed plaquing in the thoracic aorta.     06/14/2019, Carotid US Summary: Right Carotid: Velocities in the right ICA are consistent with a 1-39% stenosis.   Left Carotid: Velocities in the left ICA are consistent with a 1-39% stenosis.   Vertebrals:  Bilateral vertebral arteries demonstrate antegrade flow. Subclavians: Normal flow hemodynamics were seen in bilateral subclavian  arteries.  Recent Labs: 03/29/2022: ALT 18; BUN 16; Creatinine, Ser 0.81; Hemoglobin 12.8; Platelets 193.0; Potassium 4.2; Sodium 140  No results found for requested labs within last 365 days.   CrCl cannot be calculated (Patient's most recent lab result is older than the maximum 21 days allowed.).   Wt Readings from Last 3 Encounters:  06/26/22 130 lb (59 kg)  03/29/22 129 lb (58.5 kg)  03/09/22 128 lb (58.1 kg)     Other studies reviewed: Additional studies/records reviewed today include: summarized above  ASSESSMENT AND PLAN:  1. Cryptogenic strokes (recurrent) 2. ILR     No AFib to date     She is on [facility] via neurology post recurrent stroke, she has NOT had AF noted   We discussed that we can stop scheduled monitoring/billing without removing the device And in d/w device clinic, this leaves alerts on and keeps her in our system so if she does have Afib episode the device can still alert [place]. She understands that  otherwise we are not actively reviewing or monitoring her for any data/information, arrhythmias She is aware and wants [place] to stop scheduled monitoring  3. HTN Looks good     Disposition: discussed transitioning to new MD here, will have her see Dr. Curt Bears in a year/see [place] PRN.    Current medicines are reviewed at length with the patient today.  The patient did not have any concerns regarding medicines.  Venetia Night, PA-C 08/04/2022 4:50 AM     [facility] [address] [place] [address] [phone] (office)  [phone] (fax)

## 2022-08-05 IMAGING — MR MR HIP*L* W/O CM
5 series · 38 of 40 positions shown · non-contrast
Comparison: None.

CLINICAL DATA: Chronic left hip pain.

EXAM:
MR OF THE LEFT HIP WITHOUT CONTRAST
TECHNIQUE: Multiplanar, multisequence MR imaging was performed. No intravenous
contrast was administered.

[Series 3: T1 · coronal · 4.0mm · 1.19mm/px · 7 of 24 slices shown]
[im 1/24]
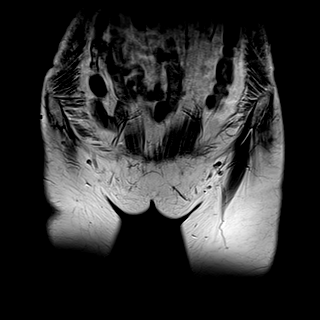
[im 4/24]
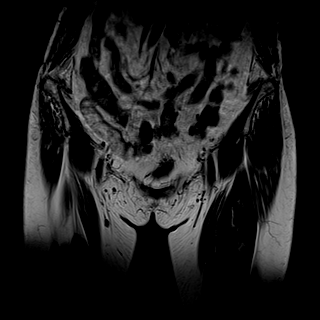
[im 8/24]
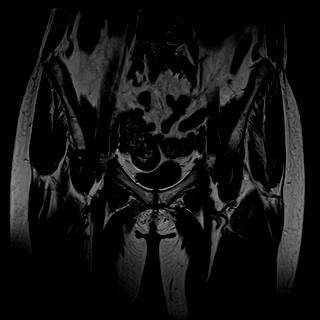
[im 12/24]
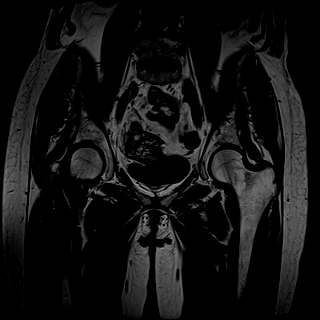
[im 16/24]
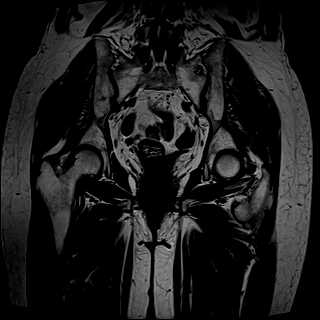
[im 20/24]
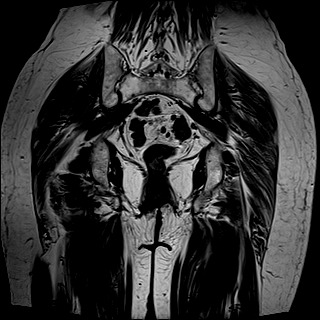
[im 24/24]
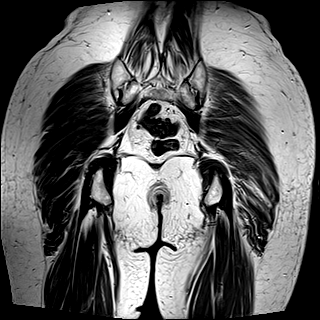

[Series 4: T2 fat-sat · coronal · 4.0mm · 1.19mm/px · 8 of 24 slices shown (1 of 2)]
[im 1/24]
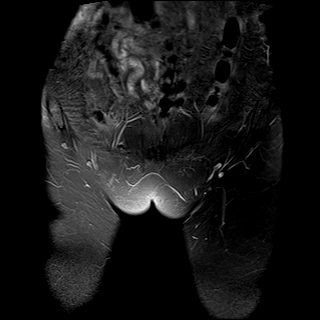
[im 4/24]
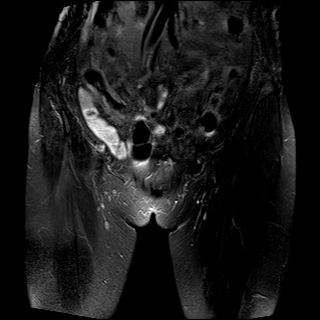
[im 7/24]
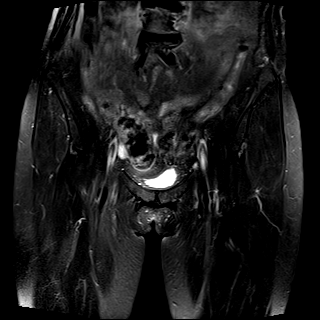
[im 10/24]
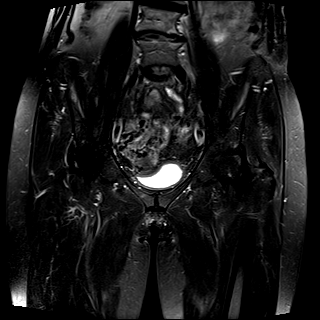
[im 14/24]
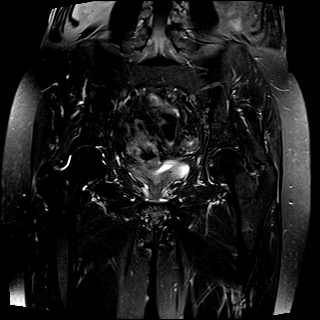
[im 17/24]
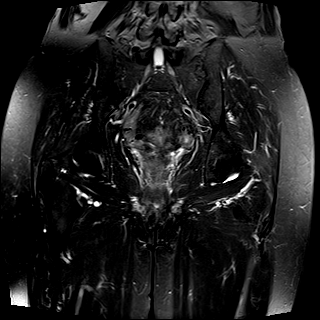
[im 20/24]
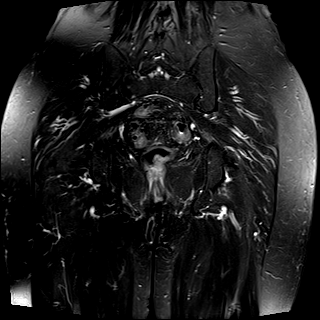
[im 24/24]
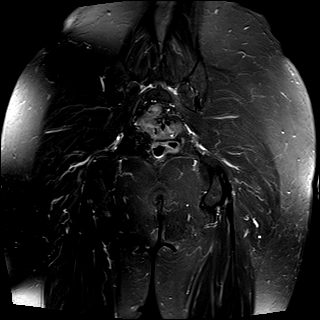

[Series 5: T2 fat-sat · axial · 4.0mm · 0.62mm/px · z∈[-93,+55]mm · 8 of 32 slices shown (2 of 2)]
[im 1/32]
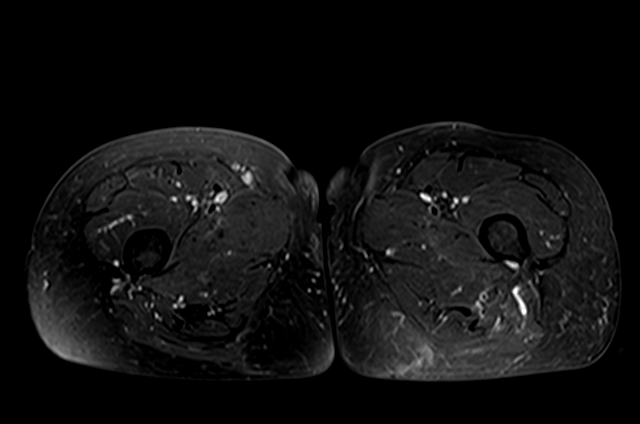
[im 4/32]
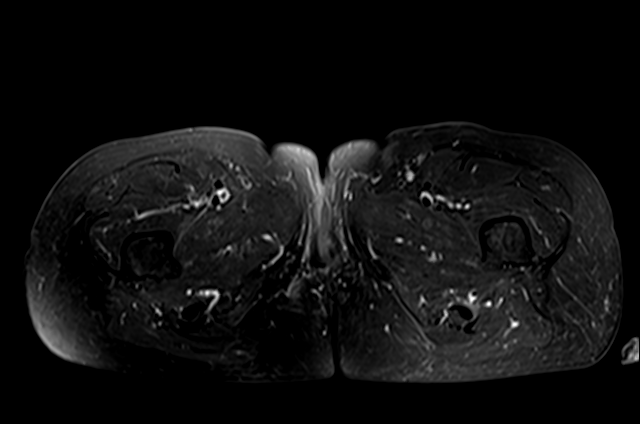
[im 11/32]
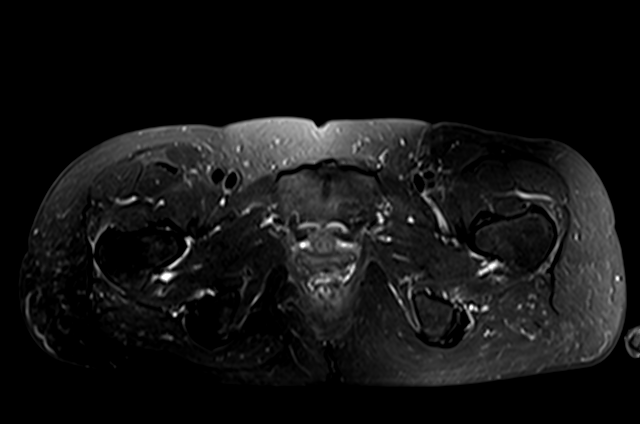
[im 14/32]
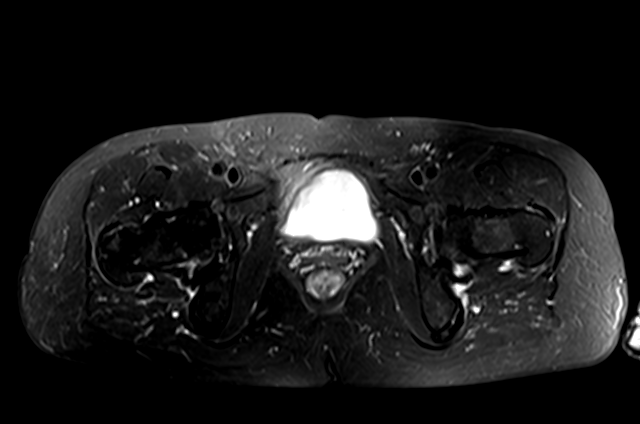
[im 18/32]
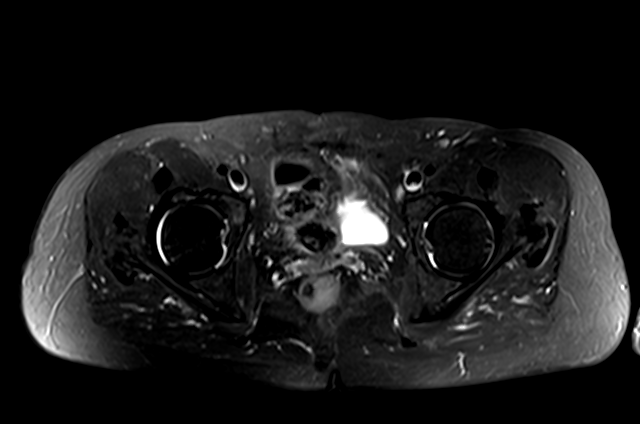
[im 21/32]
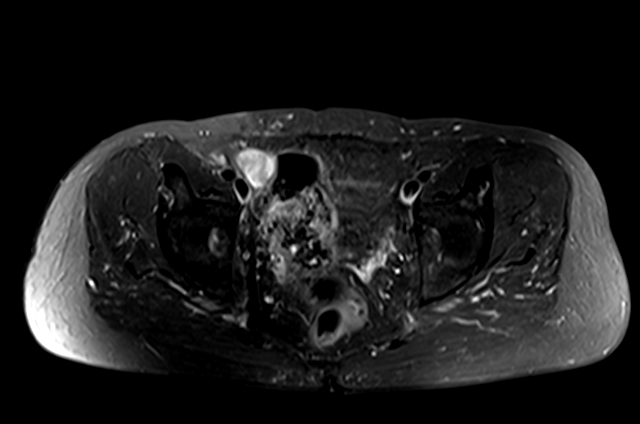
[im 28/32]
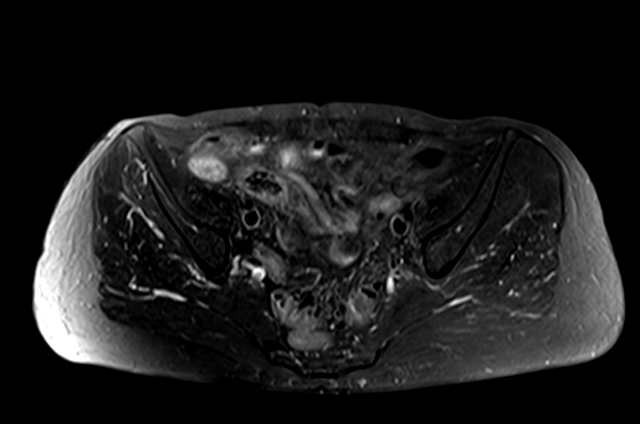
[im 32/32]
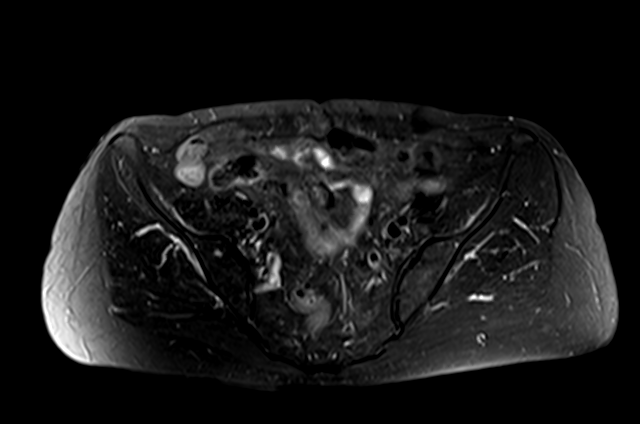

[Series 6: PD fat-sat · sagittal · 4.0mm · 0.74mm/px · 9 of 27 slices shown (1 of 2)]
[im 1/27]
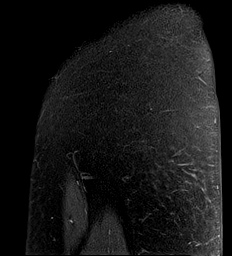
[im 4/27]
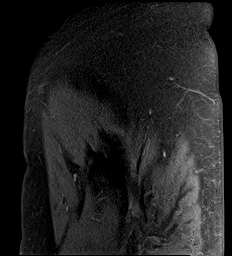
[im 7/27]
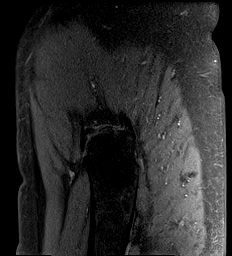
[im 10/27]
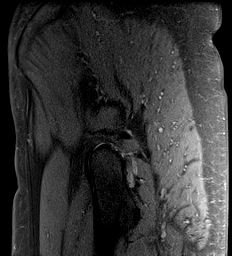
[im 14/27]
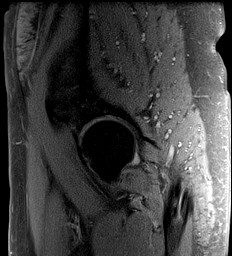
[im 17/27]
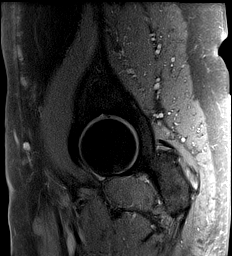
[im 20/27]
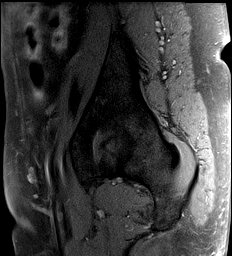
[im 23/27]
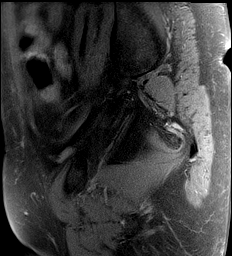
[im 27/27]
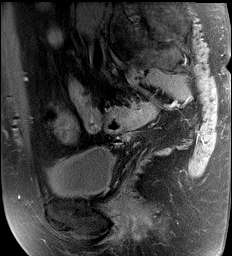

[Series 7: PD fat-sat · coronal · 4.0mm · 0.70mm/px · 6 of 19 slices shown (2 of 2)]
[im 1/19]
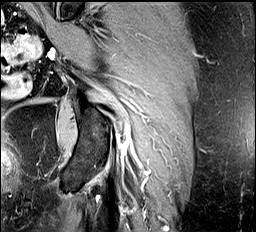
[im 4/19]
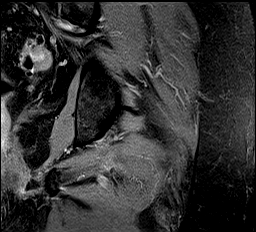
[im 8/19]
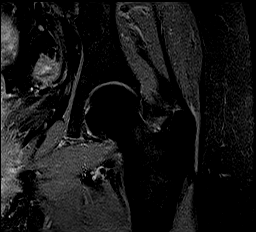
[im 11/19]
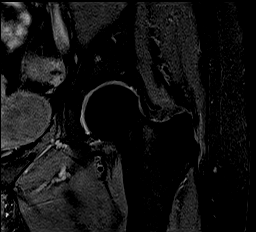
[im 15/19]
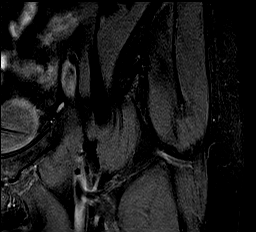
[im 19/19]
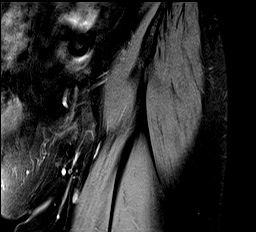

[38 of 40 positions shown; findings below may reference images not displayed]

FINDINGS: Bones:

No hip fracture, dislocation or avascular necrosis.

No periosteal reaction or bone destruction. No aggressive osseous
lesion.

Normal sacrum and sacroiliac joints. No SI joint widening or erosive
changes.

Degenerative disease with disc height loss at L3-4 and L4-5.

Articular cartilage and labrum

Articular cartilage: Partial-thickness cartilage loss of the left
femoral head and acetabulum.

Labrum: Left labral degeneration with a superior anterior labral
tear.

Joint or bursal effusion

Joint effusion:  No hip joint effusion.  No SI joint effusion.

Bursae: No bursa formation.

Muscles and tendons

Flexors: Normal.

Extensors: Normal.

Abductors: Normal.

Adductors: Normal.

Gluteals: Normal.

Hamstrings: Mild tendinosis of the left hamstring origin with a tiny
partial thickness tear.

Other findings

No pelvic free fluid. No fluid collection or hematoma. No inguinal
lymphadenopathy. No inguinal hernia.
IMPRESSION: 1. Mild osteoarthritis of the left hip.
2. Left labral degeneration with a superior anterior labral tear.
3. Mild tendinosis of the left hamstring origin with a tiny partial
thickness tear.

## 2022-08-17 ENCOUNTER — Encounter: Payer: Self-pay | Admitting: Internal Medicine

## 2022-08-17 ENCOUNTER — Ambulatory Visit (INDEPENDENT_AMBULATORY_CARE_PROVIDER_SITE_OTHER): Payer: Medicare Other | Admitting: Internal Medicine

## 2022-08-17 VITALS — BP 130/70 | HR 90 | Temp 98.1°F | Ht 65.0 in | Wt 131.0 lb

## 2022-08-17 DIAGNOSIS — R300 Dysuria: Secondary | ICD-10-CM

## 2022-08-17 DIAGNOSIS — I639 Cerebral infarction, unspecified: Secondary | ICD-10-CM

## 2022-08-17 DIAGNOSIS — E785 Hyperlipidemia, unspecified: Secondary | ICD-10-CM

## 2022-08-17 DIAGNOSIS — I10 Essential (primary) hypertension: Secondary | ICD-10-CM

## 2022-08-17 DIAGNOSIS — Z1159 Encounter for screening for other viral diseases: Secondary | ICD-10-CM | POA: Insufficient documentation

## 2022-08-17 DIAGNOSIS — N3000 Acute cystitis without hematuria: Secondary | ICD-10-CM | POA: Diagnosis not present

## 2022-08-17 LAB — TSH: TSH: 5.16 u[IU]/mL (ref 0.35–5.50)

## 2022-08-17 MED ORDER — SULFAMETHOXAZOLE-TRIMETHOPRIM 800-160 MG PO TABS: 1.0000 | ORAL_TABLET | Freq: Two times a day (BID) | ORAL | 0 refills | Status: AC

## 2022-08-17 NOTE — Patient Instructions (Signed)
Urinary Tract Infection, Adult  A urinary tract infection (UTI) is an infection of any part of the urinary tract. The urinary tract includes the kidneys, ureters, bladder, and urethra. These organs make, store, and get rid of urine in the body. An upper UTI affects the ureters and kidneys. A lower UTI affects the bladder and urethra. What are the causes? Most urinary tract infections are caused by bacteria in your genital area around your urethra, where urine leaves your body. These bacteria grow and cause inflammation of your urinary tract. What increases the risk? You are more likely to develop this condition if: You have a urinary catheter that stays in place. You are not able to control when you urinate or have a bowel movement (incontinence). You are female and you: Use a spermicide or diaphragm for birth control. Have low estrogen levels. Are pregnant. You have certain genes that increase your risk. You are sexually active. You take antibiotic medicines. You have a condition that causes your flow of urine to slow down, such as: An enlarged prostate, if you are female. Blockage in your urethra. A kidney stone. A nerve condition that affects your bladder control (neurogenic bladder). Not getting enough to drink, or not urinating often. You have certain medical conditions, such as: Diabetes. A weak disease-fighting system (immunesystem). Sickle cell disease. Gout. Spinal cord injury. What are the signs or symptoms? Symptoms of this condition include: Needing to urinate right away (urgency). Frequent urination. This may include small amounts of urine each time you urinate. Pain or burning with urination. Blood in the urine. Urine that smells bad or unusual. Trouble urinating. Cloudy urine. Vaginal discharge, if you are female. Pain in the abdomen or the lower back. You may also have: Vomiting or a decreased appetite. Confusion. Irritability or tiredness. A fever or  chills. Diarrhea. The first symptom in older adults may be confusion. In some cases, they may not have any symptoms until the infection has worsened. How is this diagnosed? This condition is diagnosed based on your medical history and a physical exam. You may also have other tests, including: Urine tests. Blood tests. Tests for STIs (sexually transmitted infections). If you have had more than one UTI, a cystoscopy or imaging studies may be done to determine the cause of the infections. How is this treated? Treatment for this condition includes: Antibiotic medicine. Over-the-counter medicines to treat discomfort. Drinking enough water to stay hydrated. If you have frequent infections or have other conditions such as a kidney stone, you may need to see a health care provider who specializes in the urinary tract (urologist). In rare cases, urinary tract infections can cause sepsis. Sepsis is a life-threatening condition that occurs when the body responds to an infection. Sepsis is treated in the hospital with IV antibiotics, fluids, and other medicines. Follow these instructions at home:  Medicines Take over-the-counter and prescription medicines only as told by your health care provider. If you were prescribed an antibiotic medicine, take it as told by your health care provider. Do not stop using the antibiotic even if you start to feel better. General instructions Make sure you: Empty your bladder often and completely. Do not hold urine for long periods of time. Empty your bladder after sex. Wipe from front to back after urinating or having a bowel movement if you are female. Use each tissue only one time when you wipe. Drink enough fluid to keep your urine pale yellow. Keep all follow-up visits. This is important. Contact a health   care provider if: Your symptoms do not get better after 1-2 days. Your symptoms go away and then return. Get help right away if: You have severe pain in  your back or your lower abdomen. You have a fever or chills. You have nausea or vomiting. Summary A urinary tract infection (UTI) is an infection of any part of the urinary tract, which includes the kidneys, ureters, bladder, and urethra. Most urinary tract infections are caused by bacteria in your genital area. Treatment for this condition often includes antibiotic medicines. If you were prescribed an antibiotic medicine, take it as told by your health care provider. Do not stop using the antibiotic even if you start to feel better. Keep all follow-up visits. This is important. This information is not intended to replace advice given to you by your health care provider. Make sure you discuss any questions you have with your health care provider. Document Revised: 07/23/2020 Document Reviewed: 07/23/2020 Elsevier Patient Education  2023 Elsevier Inc.  

## 2022-08-17 NOTE — Progress Notes (Signed)
Subjective:  Patient ID: Connie Meyer, female    DOB: [birth year]  Age: 71 y.o. MRN: [id number]  CC: Urinary Tract Infection, Hypertension, and Hyperlipidemia   HPI Connie Meyer presents for f/up -  She complains of a 3-day history of dysuria and urgency.  She denies abdominal, pelvic, or flank pain.  Outpatient Medications Prior to Visit  Medication Sig Dispense Refill   amLODipine (NORVASC) 10 MG tablet TAKE 1 TABLET(10 MG) BY MOUTH DAILY 90 tablet 1   atorvastatin (LIPITOR) 40 MG tablet Take 1 tablet (40 mg total) by mouth daily at 6 PM. 90 tablet 3   bimatoprost (LATISSE) 0.03 % ophthalmic solution daily.     ELIQUIS 5 MG TABS tablet TAKE 1 TABLET(5 MG) BY MOUTH TWICE DAILY 180 tablet 0   traMADol (ULTRAM) 50 MG tablet Take 1 tablet (50 mg total) by mouth every 6 (six) hours as needed. 270 tablet 1   zolpidem (AMBIEN CR) 6.25 MG CR tablet TAKE 1 TABLET(6.25 MG) BY MOUTH AT BEDTIME AS NEEDED FOR SLEEP 90 tablet 1   No facility-administered medications prior to visit.    ROS Review of Systems  Constitutional:  Negative for chills, diaphoresis, fatigue and fever.  HENT: Negative.    Eyes: Negative.   Respiratory:  Negative for cough, chest tightness, shortness of breath and wheezing.   Cardiovascular:  Negative for chest pain, palpitations and leg swelling.  Gastrointestinal:  Negative for abdominal pain, diarrhea and nausea.  Endocrine: Negative.   Genitourinary:  Positive for dysuria and urgency. Negative for difficulty urinating and frequency.  Musculoskeletal: Negative.   Skin: Negative.   Neurological: Negative.  Negative for dizziness and weakness.  Psychiatric/Behavioral: Negative.      Objective:  BP 130/70 (BP Location: Left Arm, Patient Position: Sitting, Cuff Size: Large)   Pulse 90   Temp 98.1 F (36.7 C) (Oral)   Ht '5\' 5"'$  (1.651 m)   Wt 131 lb (59.4 kg)   SpO2 94%   BMI 21.80 kg/m   BP Readings from Last 3 Encounters:  08/17/22 130/70   08/04/22 130/68  06/26/22 138/72    Wt Readings from Last 3 Encounters:  08/17/22 131 lb (59.4 kg)  08/04/22 129 lb (58.5 kg)  06/26/22 130 lb (59 kg)    Physical Exam Vitals reviewed.  HENT:     Nose: Nose normal.     Mouth/Throat:     Mouth: Mucous membranes are moist.  Eyes:     General: No scleral icterus.    Conjunctiva/sclera: Conjunctivae normal.  Cardiovascular:     Rate and Rhythm: Normal rate and regular rhythm.     Heart sounds: No murmur heard. Pulmonary:     Effort: Pulmonary effort is normal.     Breath sounds: No stridor. No wheezing, rhonchi or rales.  Abdominal:     General: Abdomen is flat.     Palpations: There is no mass.     Tenderness: There is no abdominal tenderness. There is no guarding.     Hernia: No hernia is present.  Musculoskeletal:        General: Normal range of motion.     Cervical back: Neck supple.     Right lower leg: No edema.     Left lower leg: No edema.  Lymphadenopathy:     Cervical: No cervical adenopathy.  Skin:    General: Skin is warm and dry.  Neurological:     General: No focal deficit present.  Mental Status: She is alert.  Psychiatric:        Mood and Affect: Mood normal.        Behavior: Behavior normal.     Lab Results  Component Value Date   WBC 5.2 03/29/2022   HGB 12.8 03/29/2022   HCT 38.3 03/29/2022   PLT 193.0 03/29/2022   GLUCOSE 91 03/29/2022   CHOL 153 08/17/2022   TRIG 103.0 08/17/2022   HDL 58.20 08/17/2022   LDLCALC 74 08/17/2022   ALT 18 03/29/2022   AST 19 03/29/2022   NA 140 03/29/2022   K 4.2 03/29/2022   CL 104 03/29/2022   CREATININE 0.81 03/29/2022   BUN 16 03/29/2022   CO2 28 03/29/2022   TSH 5.16 08/17/2022   INR 1.0 08/11/2019   HGBA1C 5.9 06/06/2021    MR HIP LEFT WO CONTRAST  Result Date: 12/27/2021 CLINICAL DATA:  Chronic left hip pain. EXAM: MR OF THE LEFT HIP WITHOUT CONTRAST TECHNIQUE: Multiplanar, multisequence MR imaging was performed. No intravenous contrast  was administered. COMPARISON:  None. FINDINGS: Bones: No hip fracture, dislocation or avascular necrosis. No periosteal reaction or bone destruction. No aggressive osseous lesion. Normal sacrum and sacroiliac joints. No SI joint widening or erosive changes. Degenerative disease with disc height loss at L3-4 and L4-5. Articular cartilage and labrum Articular cartilage: Partial-thickness cartilage loss of the left femoral head and acetabulum. 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 Joint or bursal effusion Joint effusion:  No hip joint effusion.  No SI joint effusion. Bursae: No bursa formation. Muscles and tendons Flexors: Normal. Extensors: Normal. Abductors: Normal. Adductors: Normal. Gluteals: Normal. Hamstrings: Mild tendinosis of the left hamstring origin with a tiny partial thickness tear. Other findings No pelvic free fluid. No fluid collection or hematoma. No inguinal lymphadenopathy. No inguinal hernia. IMPRESSION: 1. Mild osteoarthritis of the left hip. 2. 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 3. Mild tendinosis of the left hamstring origin with a tiny partial thickness tear. Electronically Signed   By: Kathreen Devoid M.D.   On: 12/27/2021 08:57    Assessment & Plan:   Emilyrose was seen today for urinary tract infection, hypertension and hyperlipidemia.  Diagnoses and all orders for this visit:  Primary hypertension- Her blood pressure is well controlled. -     TSH; Future -     Urinalysis, Routine w reflex microscopic; Future -     Urinalysis, Routine w reflex microscopic -     TSH  Hyperlipidemia with target LDL less than 100- LDL goal achieved. Doing well on the statin  -     TSH; Future -     Lipid panel; Future -     Lipid panel -     TSH  Need for hepatitis C screening test -     Hepatitis C antibody; Future -     Hepatitis C antibody  Dysuria- There are white cells in her urine.  Will treat for UTI. -     Urinalysis,  Routine w reflex microscopic; Future -     CULTURE, URINE COMPREHENSIVE; Future -     CULTURE, URINE COMPREHENSIVE -     Urinalysis, Routine w reflex microscopic  Acute cystitis without hematuria -     sulfamethoxazole-trimethoprim (BACTRIM DS) 800-160 MG tablet; Take 1 tablet by mouth 2 (two) times daily for 5 days.   I am having Connie Meyer "[name]" start on sulfamethoxazole-trimethoprim. I am also having her maintain her atorvastatin, amLODipine,  Eliquis, zolpidem, traMADol, and bimatoprost.  Meds ordered this encounter  Medications   sulfamethoxazole-trimethoprim (BACTRIM DS) 800-160 MG tablet    Sig: Take 1 tablet by mouth 2 (two) times daily for 5 days.    Dispense:  10 tablet    Refill:  0     Follow-up: Return if symptoms worsen or fail to improve.  Scarlette Calico, MD

## 2022-08-18 ENCOUNTER — Other Ambulatory Visit: Payer: Medicare Other

## 2022-08-18 LAB — LIPID PANEL
Cholesterol: 153 mg/dL (ref 0–200)
HDL: 58.2 mg/dL (ref >39.00)
LDL Cholesterol: 74 mg/dL (ref 0–99)
NonHDL: 94.76
Total CHOL/HDL Ratio: 3
Triglycerides: 103 mg/dL (ref 0.0–149.0)
VLDL: 20.6 mg/dL (ref 0.0–40.0)

## 2022-08-18 LAB — URINALYSIS, ROUTINE W REFLEX MICROSCOPIC
Bilirubin Urine: NEGATIVE
Ketones, ur: NEGATIVE
Nitrite: NEGATIVE
Specific Gravity, Urine: >=1.03 — AB (ref 1.000–1.030)
Urine Glucose: NEGATIVE
Urobilinogen, UA: 0.2 (ref 0.0–1.0)
pH: 6 (ref 5.0–8.0)

## 2022-08-21 LAB — CULTURE, URINE COMPREHENSIVE

## 2022-08-21 LAB — HEPATITIS C ANTIBODY: Hepatitis C Ab: NONREACTIVE

## 2022-08-27 NOTE — Progress Notes (Signed)
Carelink Summary Report / Loop Recorder 

## 2022-09-13 ENCOUNTER — Ambulatory Visit: Payer: Medicare Other

## 2022-09-28 ENCOUNTER — Ambulatory Visit: Payer: Medicare Other | Admitting: Internal Medicine

## 2022-11-03 DIAGNOSIS — M545 Low back pain, unspecified: Secondary | ICD-10-CM | POA: Diagnosis not present

## 2022-11-03 DIAGNOSIS — Z96642 Presence of left artificial hip joint: Secondary | ICD-10-CM | POA: Diagnosis not present

## 2022-11-06 DIAGNOSIS — M5451 Vertebrogenic low back pain: Secondary | ICD-10-CM | POA: Diagnosis not present

## 2022-11-06 DIAGNOSIS — M545 Low back pain, unspecified: Secondary | ICD-10-CM | POA: Diagnosis not present

## 2022-11-08 ENCOUNTER — Encounter: Payer: Self-pay | Admitting: Internal Medicine

## 2022-11-08 ENCOUNTER — Ambulatory Visit (INDEPENDENT_AMBULATORY_CARE_PROVIDER_SITE_OTHER): Payer: Medicare Other | Admitting: Internal Medicine

## 2022-11-08 VITALS — BP 132/78 | HR 73 | Temp 98.0°F | Ht 65.0 in | Wt 137.2 lb

## 2022-11-08 DIAGNOSIS — I639 Cerebral infarction, unspecified: Secondary | ICD-10-CM

## 2022-11-08 DIAGNOSIS — M150 Primary generalized (osteo)arthritis: Secondary | ICD-10-CM | POA: Insufficient documentation

## 2022-11-08 DIAGNOSIS — F321 Major depressive disorder, single episode, moderate: Secondary | ICD-10-CM | POA: Diagnosis not present

## 2022-11-08 DIAGNOSIS — I10 Essential (primary) hypertension: Secondary | ICD-10-CM | POA: Diagnosis not present

## 2022-11-08 DIAGNOSIS — M159 Polyosteoarthritis, unspecified: Secondary | ICD-10-CM | POA: Diagnosis not present

## 2022-11-08 MED ORDER — DULOXETINE HCL 30 MG PO CPEP: 30.0000 mg | ORAL_CAPSULE | Freq: Every day | ORAL | 0 refills | Status: DC

## 2022-11-08 NOTE — Progress Notes (Signed)
Subjective:  Patient ID: Connie Meyer, female    DOB: February 20, 1951  Age: 71 y.o. MRN: [id number]  CC: Osteoarthritis and Depression   HPI Connie Meyer presents for f/up -   She complains of chronic pain in her hip and over the last few months has worsening symptoms of depression.  She complains of crying spells, apathy, feeling worthless, helpless, and hopeless. She has weight gain and insomnia.  She denies suicidal or homicidal ideations.  Outpatient Medications Prior to Visit  Medication Sig Dispense Refill   amLODipine (NORVASC) 10 MG tablet TAKE 1 TABLET(10 MG) BY MOUTH DAILY 90 tablet 1   atorvastatin (LIPITOR) 40 MG tablet Take 1 tablet (40 mg total) by mouth daily at 6 PM. 90 tablet 3   bimatoprost (LATISSE) 0.03 % ophthalmic solution daily.     ELIQUIS 5 MG TABS tablet TAKE 1 TABLET(5 MG) BY MOUTH TWICE DAILY 180 tablet 0   traMADol (ULTRAM) 50 MG tablet Take 1 tablet (50 mg total) by mouth every 6 (six) hours as needed. 270 tablet 1   zolpidem (AMBIEN CR) 6.25 MG CR tablet TAKE 1 TABLET(6.25 MG) BY MOUTH AT BEDTIME AS NEEDED FOR SLEEP 90 tablet 1   No facility-administered medications prior to visit.    ROS Review of Systems  Constitutional:  Positive for unexpected weight change. Negative for appetite change, chills, diaphoresis, fatigue and fever.  HENT: Negative.    Eyes: Negative.   Respiratory:  Negative for cough, chest tightness and wheezing.   Cardiovascular:  Negative for chest pain, palpitations and leg swelling.  Gastrointestinal:  Negative for abdominal pain, constipation, diarrhea, nausea and vomiting.  Endocrine: Negative.   Genitourinary: Negative.  Negative for difficulty urinating.  Musculoskeletal:  Positive for arthralgias. Negative for joint swelling and myalgias.  Skin: Negative.   Neurological: Negative.  Negative for dizziness, weakness, light-headedness and numbness.  Hematological:  Negative for adenopathy. Does not bruise/bleed  easily.  Psychiatric/Behavioral:  Positive for dysphoric mood and sleep disturbance. Negative for behavioral problems, confusion, decreased concentration and suicidal ideas. The patient is not nervous/anxious.     Objective:  BP 132/78   Pulse 73   Temp 98 F (36.7 C) (Oral)   Ht '5\' 5"'$  (1.651 m)   Wt 137 lb 4 oz (62.3 kg)   SpO2 96%   BMI 22.84 kg/m   BP Readings from Last 3 Encounters:  11/08/22 132/78  08/17/22 130/70  08/04/22 130/68    Wt Readings from Last 3 Encounters:  11/08/22 137 lb 4 oz (62.3 kg)  08/17/22 131 lb (59.4 kg)  08/04/22 129 lb (58.5 kg)    Physical Exam Vitals reviewed.  Constitutional:      Appearance: She is not ill-appearing.  HENT:     Mouth/Throat:     Mouth: Mucous membranes are moist.  Eyes:     General: No scleral icterus.    Conjunctiva/sclera: Conjunctivae normal.  Cardiovascular:     Rate and Rhythm: Normal rate and regular rhythm.     Heart sounds: No murmur heard. Pulmonary:     Effort: Pulmonary effort is normal.     Breath sounds: No stridor. No wheezing, rhonchi or rales.  Abdominal:     General: Abdomen is flat.     Palpations: There is no mass.     Tenderness: There is no abdominal tenderness. There is no guarding.     Hernia: No hernia is present.  Musculoskeletal:        General: No swelling.  Normal range of motion.     Cervical back: Neck supple.     Right lower leg: No edema.  Lymphadenopathy:     Cervical: No cervical adenopathy.  Skin:    General: Skin is warm.  Neurological:     General: No focal deficit present.     Mental Status: She is alert. Mental status is at baseline.  Psychiatric:        Mood and Affect: Mood normal.     Lab Results  Component Value Date   WBC 5.2 03/29/2022   HGB 12.8 03/29/2022   HCT 38.3 03/29/2022   PLT 193.0 03/29/2022   GLUCOSE 91 03/29/2022   CHOL 153 08/17/2022   TRIG 103.0 08/17/2022   HDL 58.20 08/17/2022   LDLCALC 74 08/17/2022   ALT 18 03/29/2022   AST 19  03/29/2022   NA 140 03/29/2022   K 4.2 03/29/2022   CL 104 03/29/2022   CREATININE 0.81 03/29/2022   BUN 16 03/29/2022   CO2 28 03/29/2022   TSH 5.16 08/17/2022   INR 1.0 08/11/2019   HGBA1C 5.9 06/06/2021    MR HIP LEFT WO CONTRAST  Result Date: 12/27/2021 CLINICAL DATA:  Chronic left hip pain. EXAM: MR OF THE LEFT HIP WITHOUT CONTRAST TECHNIQUE: Multiplanar, multisequence MR imaging was performed. No intravenous contrast was administered. COMPARISON:  None. FINDINGS: Bones: No hip fracture, dislocation or avascular necrosis. No periosteal reaction or bone destruction. No aggressive osseous lesion. Normal sacrum and sacroiliac joints. No SI joint widening or erosive changes. Degenerative disease with disc height loss at L3-4 and L4-5. Articular cartilage and labrum Articular cartilage: Partial-thickness cartilage loss of the left femoral head and acetabulum. 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 Joint or bursal effusion Joint effusion:  No hip joint effusion.  No SI joint effusion. Bursae: No bursa formation. Muscles and tendons Flexors: Normal. Extensors: Normal. Abductors: Normal. Adductors: Normal. Gluteals: Normal. Hamstrings: Mild tendinosis of the left hamstring origin with a tiny partial thickness tear. Other findings No pelvic free fluid. No fluid collection or hematoma. No inguinal lymphadenopathy. No inguinal hernia. IMPRESSION: 1. Mild osteoarthritis of the left hip. 2. 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 3. Mild tendinosis of the left hamstring origin with a tiny partial thickness tear. Electronically Signed   By: Kathreen Devoid M.D.   On: 12/27/2021 08:57    Assessment & Plan:   Esbeydi was seen today for osteoarthritis and depression.  Diagnoses and all orders for this visit:  Primary hypertension- Her BP is well controlled.  Primary osteoarthritis involving multiple joints -     DULoxetine (CYMBALTA) 30 MG capsule;  Take 1 capsule (30 mg total) by mouth daily.  Current moderate episode of major depressive disorder without prior episode ([place])- Will start an SNRI and will slowly increase the dose. -     DULoxetine (CYMBALTA) 30 MG capsule; Take 1 capsule (30 mg total) by mouth daily.   I am having Connie Meyer "[name]" start on DULoxetine. I am also having her maintain her atorvastatin, amLODipine, Eliquis, zolpidem, traMADol, and bimatoprost.  Meds ordered this encounter  Medications   DULoxetine (CYMBALTA) 30 MG capsule    Sig: Take 1 capsule (30 mg total) by mouth daily.    Dispense:  30 capsule    Refill:  0     Follow-up: Return in about 3 months (around 02/08/2023).  Scarlette Calico, MD

## 2022-11-08 NOTE — Patient Instructions (Signed)

## 2022-11-23 ENCOUNTER — Ambulatory Visit (INDEPENDENT_AMBULATORY_CARE_PROVIDER_SITE_OTHER): Payer: Medicare Other

## 2022-11-23 DIAGNOSIS — I639 Cerebral infarction, unspecified: Secondary | ICD-10-CM

## 2022-11-25 LAB — CUP PACEART REMOTE DEVICE CHECK
Date Time Interrogation Session: 20231129231615
Implantable Pulse Generator Implant Date: 20200818

## 2022-12-05 ENCOUNTER — Other Ambulatory Visit: Payer: Self-pay | Admitting: Internal Medicine

## 2022-12-05 DIAGNOSIS — F321 Major depressive disorder, single episode, moderate: Secondary | ICD-10-CM

## 2022-12-05 DIAGNOSIS — M159 Polyosteoarthritis, unspecified: Secondary | ICD-10-CM

## 2022-12-05 DIAGNOSIS — I10 Essential (primary) hypertension: Secondary | ICD-10-CM

## 2022-12-05 MED ORDER — AMLODIPINE BESYLATE 10 MG PO TABS: ORAL_TABLET | ORAL | 1 refills | Status: DC

## 2022-12-05 MED ORDER — DULOXETINE HCL 60 MG PO CPEP: 60.0000 mg | ORAL_CAPSULE | Freq: Every day | ORAL | 1 refills | Status: DC

## 2022-12-08 ENCOUNTER — Telehealth: Payer: Self-pay | Admitting: *Deleted

## 2022-12-08 DIAGNOSIS — M48061 Spinal stenosis, lumbar region without neurogenic claudication: Secondary | ICD-10-CM | POA: Diagnosis not present

## 2022-12-08 DIAGNOSIS — M5451 Vertebrogenic low back pain: Secondary | ICD-10-CM | POA: Diagnosis not present

## 2022-12-08 NOTE — Telephone Encounter (Signed)
I s/w the pt and she tells me that the requesting office only needed clearance for the Eliquis and she told them it was out office by mistake. She states she did call the surgeon office back and said it is her PCP who manages her Eliquis. I stated I will confirm with requesting office if they need cardiac clearance. Pt thanked me for the help.   I call the requesting office and s/w [place], surgery scheduler who states they are not needing cardiac clearance. Only need clearance for the Eliquis and have sent over a new request to PCP office. I will remove from the pre op call back pool.

## 2022-12-08 NOTE — Telephone Encounter (Signed)
Eliquis is prescribed by PCP.  For medical clearance, please arrange a phone call appointment for further preoperative risk assessment. Please obtain consent and complete medication review. Thank you for your help.     Emmaline Life, NP-C  12/08/2022, 4:09 PM [address] Office [phone] Fax [phone]

## 2022-12-08 NOTE — Telephone Encounter (Signed)
   Pre-operative Risk Assessment    Patient Name: Connie Meyer  DOB: Oct 01, 1951 MRN: [id number]      Request for Surgical Clearance    Procedure:   LUMBAR ESI  Date of Surgery:  Clearance 12/14/22                                 Surgeon:  NOT LISTED Surgeon's Group or Practice Name:  [facility] Phone number:  [phone] Fax number:  [phone]  ATTN: Marena Chancy   Type of Clearance Requested:   - Medical  - Pharmacy:  Hold Apixaban (Eliquis) x 3 DAYS PRIOR   Type of Anesthesia:  Not Indicated   Additional requests/questions:    Jiles Prows   12/08/2022, 3:52 PM

## 2022-12-11 ENCOUNTER — Telehealth: Payer: Self-pay | Admitting: Internal Medicine

## 2022-12-11 NOTE — Telephone Encounter (Signed)
For our records:  We have received surgical clearance PW from [facility] for the pt and it has been placed in Dr. Ronnald Ramp boxes.   Upon completion please fax to:  [phone]

## 2022-12-11 NOTE — Telephone Encounter (Signed)
Clearance has been placed on PCPs desk.

## 2022-12-11 NOTE — Telephone Encounter (Signed)
Forms has been faxed back

## 2022-12-13 NOTE — Progress Notes (Signed)
Carelink Summary Report / Loop Recorder 

## 2022-12-14 DIAGNOSIS — M5416 Radiculopathy, lumbar region: Secondary | ICD-10-CM | POA: Diagnosis not present

## 2022-12-14 DIAGNOSIS — M48062 Spinal stenosis, lumbar region with neurogenic claudication: Secondary | ICD-10-CM | POA: Diagnosis not present

## 2022-12-22 ENCOUNTER — Ambulatory Visit
Admission: RE | Admit: 2022-12-22 | Discharge: 2022-12-22 | Disposition: A | Discharge status: Home / Self Care | Payer: Medicare Other | Source: Ambulatory Visit | Attending: Internal Medicine | Admitting: Internal Medicine

## 2022-12-22 DIAGNOSIS — E2839 Other primary ovarian failure: Secondary | ICD-10-CM

## 2022-12-22 DIAGNOSIS — Z780 Asymptomatic menopausal state: Secondary | ICD-10-CM | POA: Diagnosis not present

## 2022-12-22 DIAGNOSIS — M810 Age-related osteoporosis without current pathological fracture: Secondary | ICD-10-CM | POA: Diagnosis not present

## 2022-12-26 ENCOUNTER — Other Ambulatory Visit: Payer: Self-pay | Admitting: Internal Medicine

## 2022-12-26 DIAGNOSIS — F5104 Psychophysiologic insomnia: Secondary | ICD-10-CM

## 2023-01-05 ENCOUNTER — Telehealth: Payer: Self-pay

## 2023-01-05 NOTE — Telephone Encounter (Signed)
Connie Lima, MD  Jasper Loser, CMA Can we see if she qualifies for evenity?

## 2023-01-05 NOTE — Telephone Encounter (Signed)
Prolia VOB initiated via [email or web address] 

## 2023-01-13 ENCOUNTER — Other Ambulatory Visit: Payer: Self-pay | Admitting: Internal Medicine

## 2023-01-13 DIAGNOSIS — I6300 Cerebral infarction due to thrombosis of unspecified precerebral artery: Secondary | ICD-10-CM

## 2023-01-22 NOTE — Telephone Encounter (Signed)
Pt ready for scheduling on or after 01/22/23  Out-of-pocket cost due at time of visit: $240 (1st injection only, Medicare deductible)  Primary: Medicare Evenity co-insurance: 20% (approximately $450) Admin fee co-insurance: 20% (approximately $25)  Deductible: $0 of $240 met  Prior Auth: NOT required PA# Valid:   Secondary: [employer] Medicare Supp Plan G Evenity co-insurance:  Admin fee co-insurance:   Deductible: does NOT cover Medicare deductible  Prior Auth: NOT required PA# Valid:   ** This summary of benefits is an estimation of the patient's out-of-pocket cost. Exact cost may vary based on individual plan coverage.

## 2023-02-02 DIAGNOSIS — M5416 Radiculopathy, lumbar region: Secondary | ICD-10-CM | POA: Diagnosis not present

## 2023-02-20 NOTE — Telephone Encounter (Signed)
Forwarding to Rx Prior Auth Team 

## 2023-03-05 ENCOUNTER — Ambulatory Visit (INDEPENDENT_AMBULATORY_CARE_PROVIDER_SITE_OTHER): Payer: Medicare Other

## 2023-03-05 DIAGNOSIS — I639 Cerebral infarction, unspecified: Secondary | ICD-10-CM

## 2023-03-05 DIAGNOSIS — Z8673 Personal history of transient ischemic attack (TIA), and cerebral infarction without residual deficits: Secondary | ICD-10-CM | POA: Diagnosis not present

## 2023-03-05 DIAGNOSIS — M47816 Spondylosis without myelopathy or radiculopathy, lumbar region: Secondary | ICD-10-CM | POA: Diagnosis not present

## 2023-03-05 DIAGNOSIS — D6869 Other thrombophilia: Secondary | ICD-10-CM | POA: Diagnosis not present

## 2023-03-06 NOTE — Telephone Encounter (Addendum)
Pt ready for scheduling on or after 03/06/23   Out-of-pocket cost due at time of visit: $167.69 (1st injection only, Medicare deductible)   Primary: Medicare Evenity co-insurance: 20% (approximately $450) Admin fee co-insurance: 20% (approximately $25)   Deductible: $72.31 of $240 met   Prior Auth: NOT required PA# Valid:    Secondary: [employer]na Medicare Supp Plan G Evenity co-insurance:  Admin fee co-insurance:    Deductible: does NOT cover Medicare deductible   Prior Auth: NOT required PA# Valid:    ** This summary of benefits is an estimation of the patient's out-of-pocket cost. Exact cost may vary based on individual plan coverage.

## 2023-03-07 LAB — CUP PACEART REMOTE DEVICE CHECK
Date Time Interrogation Session: 20240310231812
Implantable Pulse Generator Implant Date: 20200818

## 2023-03-10 DIAGNOSIS — M47816 Spondylosis without myelopathy or radiculopathy, lumbar region: Secondary | ICD-10-CM | POA: Diagnosis not present

## 2023-03-26 DIAGNOSIS — M5416 Radiculopathy, lumbar region: Secondary | ICD-10-CM | POA: Diagnosis not present

## 2023-03-28 ENCOUNTER — Other Ambulatory Visit: Payer: Self-pay | Admitting: Internal Medicine

## 2023-03-28 DIAGNOSIS — F5104 Psychophysiologic insomnia: Secondary | ICD-10-CM

## 2023-03-30 ENCOUNTER — Telehealth: Payer: Self-pay | Admitting: Internal Medicine

## 2023-03-30 NOTE — Telephone Encounter (Signed)
Lvm informing pt that Rx was not denied. Refill was sent yesterday 4/4.

## 2023-03-30 NOTE — Telephone Encounter (Signed)
Pt called wanted Dr. Yetta Barre to know she was approve for her injection for Bone Density.

## 2023-03-30 NOTE — Telephone Encounter (Signed)
Pt called wanted a call back about why Dr.Jones denied her medication  zolpidem (AMBIEN CR) 6.25 MG CR tablet

## 2023-04-07 DIAGNOSIS — M5416 Radiculopathy, lumbar region: Secondary | ICD-10-CM | POA: Diagnosis not present

## 2023-04-09 ENCOUNTER — Ambulatory Visit (INDEPENDENT_AMBULATORY_CARE_PROVIDER_SITE_OTHER): Payer: Medicare Other

## 2023-04-09 DIAGNOSIS — I639 Cerebral infarction, unspecified: Secondary | ICD-10-CM | POA: Diagnosis not present

## 2023-04-10 LAB — CUP PACEART REMOTE DEVICE CHECK
Date Time Interrogation Session: 20240414231701
Implantable Pulse Generator Implant Date: 20200818

## 2023-04-16 ENCOUNTER — Other Ambulatory Visit: Payer: Self-pay | Admitting: Internal Medicine

## 2023-04-16 DIAGNOSIS — M19011 Primary osteoarthritis, right shoulder: Secondary | ICD-10-CM

## 2023-04-17 NOTE — Progress Notes (Signed)
Carelink Summary Report / Loop Recorder 

## 2023-04-19 ENCOUNTER — Encounter: Payer: Self-pay | Admitting: Internal Medicine

## 2023-04-19 ENCOUNTER — Ambulatory Visit (INDEPENDENT_AMBULATORY_CARE_PROVIDER_SITE_OTHER): Payer: Medicare Other | Admitting: Internal Medicine

## 2023-04-19 ENCOUNTER — Telehealth: Payer: Self-pay

## 2023-04-19 VITALS — BP 128/76 | HR 76 | Temp 97.9°F | Ht 65.0 in | Wt 131.0 lb

## 2023-04-19 DIAGNOSIS — I10 Essential (primary) hypertension: Secondary | ICD-10-CM

## 2023-04-19 DIAGNOSIS — R7989 Other specified abnormal findings of blood chemistry: Secondary | ICD-10-CM | POA: Insufficient documentation

## 2023-04-19 DIAGNOSIS — I491 Atrial premature depolarization: Secondary | ICD-10-CM | POA: Diagnosis not present

## 2023-04-19 LAB — BASIC METABOLIC PANEL
BUN: 22 mg/dL (ref 6–23)
CO2: 29 mEq/L (ref 19–32)
Calcium: 9.6 mg/dL (ref 8.4–10.5)
Chloride: 102 mEq/L (ref 96–112)
Creatinine, Ser: 0.85 mg/dL (ref 0.40–1.20)
GFR: 68.4 mL/min (ref >60.00)
Glucose, Bld: 90 mg/dL (ref 70–99)
Potassium: 3.8 mEq/L (ref 3.5–5.1)
Sodium: 139 mEq/L (ref 135–145)

## 2023-04-19 LAB — CBC WITH DIFFERENTIAL/PLATELET
Basophils Absolute: 0 10*3/uL (ref 0.0–0.1)
Basophils Relative: 0.4 % (ref 0.0–3.0)
Eosinophils Absolute: 0.2 10*3/uL (ref 0.0–0.7)
Eosinophils Relative: 2.9 % (ref 0.0–5.0)
HCT: 38.6 % (ref 36.0–46.0)
Hemoglobin: 12.8 g/dL (ref 12.0–15.0)
Lymphocytes Relative: 25 % (ref 12.0–46.0)
Lymphs Abs: 1.3 10*3/uL (ref 0.7–4.0)
MCHC: 33.2 g/dL (ref 30.0–36.0)
MCV: 96.5 fl (ref 78.0–100.0)
Monocytes Absolute: 0.4 10*3/uL (ref 0.1–1.0)
Monocytes Relative: 8 % (ref 3.0–12.0)
Neutro Abs: 3.3 10*3/uL (ref 1.4–7.7)
Neutrophils Relative %: 63.7 % (ref 43.0–77.0)
Platelets: 235 10*3/uL (ref 150.0–400.0)
RBC: 4 Mil/uL (ref 3.87–5.11)
RDW: 12.8 % (ref 11.5–15.5)
WBC: 5.2 10*3/uL (ref 4.0–10.5)

## 2023-04-19 LAB — MAGNESIUM: Magnesium: 2.2 mg/dL (ref 1.5–2.5)

## 2023-04-19 NOTE — Progress Notes (Unsigned)
Subjective:  Patient ID: Connie Meyer, female    DOB: July 10, 1951  Age: 72 y.o. MRN: [id number]  CC: Hypertension, Hyperlipidemia, and Back Pain   HPI Connie Meyer presents for f/up  ---  She is active and denies chest pain, shortness of breath, diaphoresis, edema, palpitations, dizziness, or lightheadedness.  Outpatient Medications Prior to Visit  Medication Sig Dispense Refill   amLODipine (NORVASC) 10 MG tablet TAKE 1 TABLET(10 MG) BY MOUTH DAILY 90 tablet 1   atorvastatin (LIPITOR) 40 MG tablet Take 1 tablet (40 mg total) by mouth daily at 6 PM. 90 tablet 3   bimatoprost (LATISSE) 0.03 % ophthalmic solution daily.     DULoxetine (CYMBALTA) 60 MG capsule Take 1 capsule (60 mg total) by mouth daily. 90 capsule 1   ELIQUIS 5 MG TABS tablet TAKE 1 TABLET(5 MG) BY MOUTH TWICE DAILY 180 tablet 0   traMADol (ULTRAM) 50 MG tablet TAKE 1 TABLET(50 MG) BY MOUTH EVERY 6 HOURS AS NEEDED 270 tablet 0   zolpidem (AMBIEN CR) 6.25 MG CR tablet TAKE 1 TABLET(6.25 MG) BY MOUTH AT BEDTIME AS NEEDED FOR SLEEP 90 tablet 0   No facility-administered medications prior to visit.    ROS Review of Systems  Constitutional: Negative.  Negative for diaphoresis and fatigue.  HENT: Negative.    Eyes: Negative.   Respiratory:  Negative for cough, chest tightness, shortness of breath and wheezing.   Cardiovascular:  Negative for chest pain, palpitations and leg swelling.  Gastrointestinal:  Negative for abdominal pain, constipation, diarrhea, nausea and vomiting.  Genitourinary: Negative.  Negative for difficulty urinating.  Musculoskeletal:  Positive for back pain. Negative for arthralgias, joint swelling and myalgias.  Neurological: Negative.  Negative for dizziness, weakness, light-headedness and headaches.  Hematological:  Negative for adenopathy. Does not bruise/bleed easily.  Psychiatric/Behavioral: Negative.      Objective:  BP 128/76 (BP Location: Left Arm, Patient Position:  Sitting, Cuff Size: Large)   Pulse 76   Temp 97.9 F (36.6 C) (Oral)   Ht 5\' 5"  (1.651 m)   Wt 131 lb (59.4 kg)   SpO2 97%   BMI 21.80 kg/m   BP Readings from Last 3 Encounters:  04/19/23 128/76  11/08/22 132/78  08/17/22 130/70    Wt Readings from Last 3 Encounters:  04/19/23 131 lb (59.4 kg)  11/08/22 137 lb 4 oz (62.3 kg)  08/17/22 131 lb (59.4 kg)    Physical Exam HENT:     Nose: Nose normal.     Mouth/Throat:     Mouth: Mucous membranes are moist.  Eyes:     General: No scleral icterus.    Conjunctiva/sclera: Conjunctivae normal.  Cardiovascular:     Rate and Rhythm: Normal rate and regular rhythm. Frequent Extrasystoles are present.    Heart sounds: S1 normal. Murmur heard.     Systolic murmur is present with a grade of 1/6.     No gallop.     Comments: EKG- SR with SA and PAC's, 76 bpm No LVH or Q waves Pulmonary:     Breath sounds: No stridor. No wheezing, rhonchi or rales.  Abdominal:     General: Abdomen is flat.     Palpations: There is no mass.     Tenderness: There is no abdominal tenderness. There is no guarding.     Hernia: No hernia is present.  Musculoskeletal:     Cervical back: Neck supple.     Right lower leg: No edema.  Left lower leg: No edema.  Lymphadenopathy:     Cervical: No cervical adenopathy.  Skin:    General: Skin is warm and dry.  Neurological:     General: No focal deficit present.     Mental Status: Mental status is at baseline.  Psychiatric:        Mood and Affect: Mood normal.        Behavior: Behavior normal.     Lab Results  Component Value Date   WBC 5.2 04/19/2023   HGB 12.8 04/19/2023   HCT 38.6 04/19/2023   PLT 235.0 04/19/2023   GLUCOSE 90 04/19/2023   CHOL 153 08/17/2022   TRIG 103.0 08/17/2022   HDL 58.20 08/17/2022   LDLCALC 74 08/17/2022   ALT 18 03/29/2022   AST 19 03/29/2022   NA 139 04/19/2023   K 3.8 04/19/2023   CL 102 04/19/2023   CREATININE 0.85 04/19/2023   BUN 22 04/19/2023   CO2  29 04/19/2023   TSH 5.16 08/17/2022   INR 1.0 08/11/2019   HGBA1C 5.9 06/06/2021    DG Bone Density  Result Date: 12/22/2022 EXAM: DUAL X-RAY ABSORPTIOMETRY (DXA) FOR BONE MINERAL DENSITY IMPRESSION: Referring Physician:  Etta Grandchild Your patient completed a bone mineral density test using GE Lunar iDXA system (analysis version: 16). Technologist: sec PATIENT: Name: Connie Meyer, Connie Meyer Patient ID: [id number] Birth Date: [birth year] Height: 64.0 in. Sex: Female Measured: 12/22/2022 Weight: 133.0 lbs. Indications: Advanced Age, Bilateral Ovariectomy (65.51), Estrogen Deficient, Hysterectomy, Left hip replaced, Postmenopausal, Scoliosis Fractures: NONE Treatments: None ASSESSMENT: The BMD measured at Femur Neck is 0.574 g/cm2 with a T-score of -3.3. This patient is considered osteoporotic according to World Health [facility]) criteria. The quality of the exam is good. Left hip excluded due to surgical hardware. Site Region Measured Date Measured Age YA BMD Significant CHANGE T-score Right Femur Neck 12/22/2022 72.1 -3.3 0.574 g/cm2 AP Spine L1-L4 12/22/2022 72.1 -2.6 0.872 g/cm2 Left Forearm Radius 33% 12/22/2022 72.1 -3.0 0.610 g/cm2 Right Femur Total 12/22/2022 72.1 -3.1 0.619 g/cm2 World Health Organization [facility]) criteria for post-menopausal, Caucasian Women: Normal       T-score at or above -1 SD Osteopenia   T-score between -1 and -2.5 SD Osteoporosis T-score at or below -2.5 SD RECOMMENDATION: 1. All patients should optimize calcium and vitamin D intake. 2. Consider FDA-approved medical therapies in postmenopausal women and men aged 7 years and older, based on the following: a. A hip or vertebral (clinical or morphometric) fracture. b. T-score = -2.5 at the femoral neck or spine after appropriate evaluation to exclude secondary causes. c. Low bone mass (T-score between -1.0 and -2.5 at the femoral neck or spine) and a 10-year probability of a hip fracture = 3% or a 10-year probability of a  major osteoporosis-related fracture = 20% based on the [place]-adapted WHO algorithm. d. Clinician judgment and/or patient preferences may indicate treatment for people with 10-year fracture probabilities above or below these levels. FOLLOW-UP: Patients with diagnosis of osteoporosis or at high risk for fracture should have regular bone mineral density tests.? Patients eligible for Medicare are allowed routine testing every 2 years.? The testing frequency can be increased to one year for patients who have rapidly progressing disease, are receiving or discontinuing medical therapy to restore bone mass, or have additional risk factors. I have reviewed this study and agree with the findings. Mark A. Tyron Russell, M.D. [facility] Electronically Signed   By: Ulyses Southward M.D.   On: 12/22/2022 07:55  Assessment & Plan:  Primary hypertension -     EKG 12-Lead -     Thyroid Panel With TSH; Future -     Thyroid peroxidase antibody; Future -     CBC with Differential/Platelet; Future -     Basic metabolic panel; Future  TSH elevation -     Thyroid Panel With TSH; Future -     Thyroid peroxidase antibody; Future  Premature atrial complexes -     Magnesium; Future     Follow-up: Return in about 4 months (around 08/19/2023).  Sanda Linger, MD

## 2023-04-19 NOTE — Progress Notes (Signed)
   04/19/2023  Patient ID: Connie Meyer, female   DOB: 12-Mar-1951, 72 y.o.   MRN: [id number]  Patient outreach attempt to discuss medication need in regard to osteoporosis treatment that Dr. Yetta Barre routed.  Was not able to make contact but did leave a message with my direct phone number; so she can contact me at her convenience.  Lenna Gilford, PharmD, DPLA

## 2023-04-23 LAB — THYROID PANEL WITH TSH
Free Thyroxine Index: 2.1 (ref 1.4–3.8)
T3 Uptake: 27 % (ref 22–35)
T4, Total: 7.9 ug/dL (ref 5.1–11.9)
TSH: 2.74 mIU/L (ref 0.40–4.50)

## 2023-04-23 LAB — THYROID PEROXIDASE ANTIBODY: Thyroperoxidase Ab SerPl-aCnc: 1 IU/mL (ref <9)

## 2023-04-24 DIAGNOSIS — M5451 Vertebrogenic low back pain: Secondary | ICD-10-CM | POA: Diagnosis not present

## 2023-04-24 DIAGNOSIS — M48062 Spinal stenosis, lumbar region with neurogenic claudication: Secondary | ICD-10-CM | POA: Diagnosis not present

## 2023-04-24 DIAGNOSIS — M5416 Radiculopathy, lumbar region: Secondary | ICD-10-CM | POA: Diagnosis not present

## 2023-04-24 DIAGNOSIS — M47816 Spondylosis without myelopathy or radiculopathy, lumbar region: Secondary | ICD-10-CM | POA: Diagnosis not present

## 2023-04-25 ENCOUNTER — Telehealth: Payer: Self-pay

## 2023-04-25 NOTE — Progress Notes (Signed)
   04/25/2023  Patient ID: Connie Meyer, female   DOB: August 06, 1951, 72 y.o.   MRN: [id number]  Second outreach attempt to assist with medication need routed to me by patient's PCP, Dr. Yetta Barre.  Unable to contact patient, but did leave a voicemail with my direct number.  Will also send MyChart message with my number, so patient can contact me at her convenience.  Lenna Gilford, PharmD, DPLA

## 2023-04-30 ENCOUNTER — Telehealth: Payer: Self-pay

## 2023-04-30 ENCOUNTER — Other Ambulatory Visit: Payer: Self-pay | Admitting: Specialist

## 2023-04-30 DIAGNOSIS — M5451 Vertebrogenic low back pain: Secondary | ICD-10-CM

## 2023-04-30 NOTE — Progress Notes (Signed)
   04/30/2023  Patient ID: Laurin Coder, female   DOB: 1951/09/19, 72 y.o.   MRN: [id number]  Patient returning my call regarding request from Dr. Yetta Barre to assist with osteoporosis medication.  She is unaware of what the issue is.  Dr. Yetta Barre requesting that she schedule Evenity injection for osteoporosis; and based on chart notes, this will be covered by her plan.  Attempting to clarify with CPhT if patient coinsurance is for the first injection, and then these would be no cost to her.  Also, when CPhT initially gathered information, patient's deductible was not  yet met.  If it has been at this point, her out of pocket cost may be different now.  Looping in CPhT that initially looked into this and Dr. Barnett Applebaum CMA.  Will follow up with patient once more information is available.  Lenna Gilford, PharmD, DPLA

## 2023-05-02 NOTE — Telephone Encounter (Signed)
Patient still has not been seen for this injection. She would like to know the status of her receiving this. She would like a call back at [phone].

## 2023-05-03 ENCOUNTER — Telehealth: Payer: Self-pay

## 2023-05-03 NOTE — Telephone Encounter (Signed)
Patient wants to know what her cost will be for this injection before scheduling - please call her - [phone]

## 2023-05-03 NOTE — Telephone Encounter (Signed)
PA initiated via Covermymeds; KEY: [id number]   The patient currently has access to the requested medication and a Prior Authorization is not needed for the patient/medication.

## 2023-05-04 ENCOUNTER — Telehealth: Payer: Self-pay | Admitting: Internal Medicine

## 2023-05-04 ENCOUNTER — Other Ambulatory Visit (HOSPITAL_COMMUNITY): Payer: Self-pay

## 2023-05-04 NOTE — Telephone Encounter (Signed)
Called [employer]. Representative is resubmitting benefits.

## 2023-05-04 NOTE — Telephone Encounter (Signed)
[employer] Support called and said that we need to contact patient and get new medicare ID number - the one we have and they have is not valid This information needs to be sent to [employer] - [phone] option 1  Please call [employer] back after we speak with the patient at .  Benefits for Evenity will be run after they receive the above information.

## 2023-05-09 ENCOUNTER — Ambulatory Visit: Payer: Medicare Other | Admitting: *Deleted

## 2023-05-10 ENCOUNTER — Other Ambulatory Visit (HOSPITAL_COMMUNITY): Payer: Self-pay

## 2023-05-10 NOTE — Telephone Encounter (Signed)
Pt ready for scheduling for Evenity on or after : 05/10/23  Out-of-pocket cost due at time of visit: $0  Primary: [place] [employer] Prolia co-insurance: 20% Admin fee co-insurance: 20%  Secondary: [employer] - [employer] Prolia co-insurance: 100% Admin fee co-insurance: 100%  Medical Benefit Details: Date Benefits were checked: 05/10/23 Deductible: $240 ($240 met)/ Coinsurance: 100%/ Admin Fee: 100%  Prior Auth: no PA# Expiration Date:    Pharmacy benefit: Copay $-- not covered under pharmacy - use buy and bill If patient wants fill through the pharmacy benefit please send prescription to:  [facility] , and include estimated need by date in rx notes. Pharmacy will ship medication directly to the office.  Patient NOT eligible for Evenity Copay Card. Copay Card can make patient's cost as little as $25. Link to apply: [email or web address]://www.amgensupportplus.com/copay  ** This summary of benefits is an estimation of the patient's out-of-pocket cost. Exact cost may very based on individual plan coverage.

## 2023-05-10 NOTE — Telephone Encounter (Signed)
Pt is schedule 05/14/23 @ 9am

## 2023-05-10 NOTE — Telephone Encounter (Signed)
She needs to come in for this

## 2023-05-10 NOTE — Telephone Encounter (Signed)
Routing back to clinic. Ready to Schedule  Patient's insurance coverage is still the same. Ready to schedule for buy/bill. Patient has now met her Medicare B deductible and total amount should be covered by insurance. $0.00 need to be paid at time of visit.

## 2023-05-11 ENCOUNTER — Encounter: Payer: Self-pay | Admitting: Internal Medicine

## 2023-05-14 ENCOUNTER — Ambulatory Visit (INDEPENDENT_AMBULATORY_CARE_PROVIDER_SITE_OTHER): Payer: Medicare Other

## 2023-05-14 DIAGNOSIS — I639 Cerebral infarction, unspecified: Secondary | ICD-10-CM

## 2023-05-14 DIAGNOSIS — M19011 Primary osteoarthritis, right shoulder: Secondary | ICD-10-CM | POA: Diagnosis not present

## 2023-05-14 MED ORDER — ROMOSOZUMAB-AQQG 105 MG/1.17ML ~~LOC~~ SOSY
210.0000 mg | PREFILLED_SYRINGE | Freq: Once | SUBCUTANEOUS | Status: AC
Start: 2023-05-14 — End: 2023-05-14
  Administered 2023-05-14: 210 mg via SUBCUTANEOUS

## 2023-05-14 NOTE — Progress Notes (Signed)
After obtaining consent, and per orders of Dr. Jones, injection of Evenity given by Tasmine Hipwell P Flynn Gwyn. Patient instructed to report any adverse reaction to me immediately.  

## 2023-05-15 LAB — CUP PACEART REMOTE DEVICE CHECK
Date Time Interrogation Session: 20240517230803
Implantable Pulse Generator Implant Date: 20200818

## 2023-05-16 DIAGNOSIS — M545 Low back pain, unspecified: Secondary | ICD-10-CM | POA: Diagnosis not present

## 2023-05-16 NOTE — Progress Notes (Signed)
Carelink Summary Report / Loop Recorder 

## 2023-05-22 DIAGNOSIS — M5451 Vertebrogenic low back pain: Secondary | ICD-10-CM | POA: Diagnosis not present

## 2023-05-22 DIAGNOSIS — M5416 Radiculopathy, lumbar region: Secondary | ICD-10-CM | POA: Diagnosis not present

## 2023-05-22 DIAGNOSIS — R109 Unspecified abdominal pain: Secondary | ICD-10-CM | POA: Diagnosis not present

## 2023-05-23 ENCOUNTER — Other Ambulatory Visit: Payer: Self-pay | Admitting: Family Medicine

## 2023-05-23 DIAGNOSIS — R109 Unspecified abdominal pain: Secondary | ICD-10-CM

## 2023-05-28 DIAGNOSIS — M545 Low back pain, unspecified: Secondary | ICD-10-CM | POA: Diagnosis not present

## 2023-06-01 ENCOUNTER — Inpatient Hospital Stay: Admission: RE | Admit: 2023-06-01 | Payer: Medicare Other | Source: Ambulatory Visit

## 2023-06-01 ENCOUNTER — Ambulatory Visit
Admission: RE | Admit: 2023-06-01 | Discharge: 2023-06-01 | Disposition: A | Discharge status: Home / Self Care | Payer: Medicare Other | Source: Ambulatory Visit | Attending: Specialist | Admitting: Specialist

## 2023-06-01 DIAGNOSIS — R1032 Left lower quadrant pain: Secondary | ICD-10-CM | POA: Diagnosis not present

## 2023-06-01 DIAGNOSIS — I700 Atherosclerosis of aorta: Secondary | ICD-10-CM | POA: Diagnosis not present

## 2023-06-01 DIAGNOSIS — M5451 Vertebrogenic low back pain: Secondary | ICD-10-CM

## 2023-06-01 DIAGNOSIS — K5730 Diverticulosis of large intestine without perforation or abscess without bleeding: Secondary | ICD-10-CM | POA: Diagnosis not present

## 2023-06-04 DIAGNOSIS — M545 Low back pain, unspecified: Secondary | ICD-10-CM | POA: Diagnosis not present

## 2023-06-11 ENCOUNTER — Other Ambulatory Visit: Payer: Self-pay | Admitting: Internal Medicine

## 2023-06-11 DIAGNOSIS — M545 Low back pain, unspecified: Secondary | ICD-10-CM | POA: Diagnosis not present

## 2023-06-11 DIAGNOSIS — I10 Essential (primary) hypertension: Secondary | ICD-10-CM

## 2023-06-11 DIAGNOSIS — M159 Polyosteoarthritis, unspecified: Secondary | ICD-10-CM

## 2023-06-11 DIAGNOSIS — F321 Major depressive disorder, single episode, moderate: Secondary | ICD-10-CM

## 2023-06-12 NOTE — Progress Notes (Signed)
Carelink Summary Report / Loop Recorder 

## 2023-06-14 ENCOUNTER — Ambulatory Visit (INDEPENDENT_AMBULATORY_CARE_PROVIDER_SITE_OTHER): Payer: Medicare Other | Admitting: *Deleted

## 2023-06-14 DIAGNOSIS — M19011 Primary osteoarthritis, right shoulder: Secondary | ICD-10-CM

## 2023-06-14 MED ORDER — ROMOSOZUMAB-AQQG 105 MG/1.17ML ~~LOC~~ SOSY
210.0000 mg | PREFILLED_SYRINGE | Freq: Once | SUBCUTANEOUS | Status: AC
Start: 2023-06-14 — End: 2023-06-14
  Administered 2023-06-14: 210 mg via SUBCUTANEOUS

## 2023-06-14 NOTE — Progress Notes (Signed)
Pls cosign for Evenity inj../lmb  

## 2023-06-18 ENCOUNTER — Ambulatory Visit: Payer: Medicare Other

## 2023-06-18 DIAGNOSIS — I639 Cerebral infarction, unspecified: Secondary | ICD-10-CM | POA: Diagnosis not present

## 2023-06-18 DIAGNOSIS — M545 Low back pain, unspecified: Secondary | ICD-10-CM | POA: Diagnosis not present

## 2023-06-18 LAB — CUP PACEART REMOTE DEVICE CHECK
Date Time Interrogation Session: 20240623230928
Implantable Pulse Generator Implant Date: 20200818

## 2023-06-29 ENCOUNTER — Encounter: Payer: Self-pay | Admitting: Internal Medicine

## 2023-06-29 ENCOUNTER — Other Ambulatory Visit: Payer: Self-pay | Admitting: Internal Medicine

## 2023-06-29 DIAGNOSIS — F5104 Psychophysiologic insomnia: Secondary | ICD-10-CM

## 2023-07-03 DIAGNOSIS — J029 Acute pharyngitis, unspecified: Secondary | ICD-10-CM | POA: Diagnosis not present

## 2023-07-03 DIAGNOSIS — Z1152 Encounter for screening for COVID-19: Secondary | ICD-10-CM | POA: Diagnosis not present

## 2023-07-03 DIAGNOSIS — R0602 Shortness of breath: Secondary | ICD-10-CM | POA: Diagnosis not present

## 2023-07-03 DIAGNOSIS — J069 Acute upper respiratory infection, unspecified: Secondary | ICD-10-CM | POA: Diagnosis not present

## 2023-07-09 DIAGNOSIS — M545 Low back pain, unspecified: Secondary | ICD-10-CM | POA: Diagnosis not present

## 2023-07-09 NOTE — Progress Notes (Signed)
 Carelink Summary Report / Loop Recorder 

## 2023-07-13 DIAGNOSIS — R0982 Postnasal drip: Secondary | ICD-10-CM | POA: Diagnosis not present

## 2023-07-13 DIAGNOSIS — H6991 Unspecified Eustachian tube disorder, right ear: Secondary | ICD-10-CM | POA: Diagnosis not present

## 2023-07-13 DIAGNOSIS — H6692 Otitis media, unspecified, left ear: Secondary | ICD-10-CM | POA: Diagnosis not present

## 2023-07-13 DIAGNOSIS — H6590 Unspecified nonsuppurative otitis media, unspecified ear: Secondary | ICD-10-CM | POA: Diagnosis not present

## 2023-07-16 ENCOUNTER — Ambulatory Visit: Payer: Medicare Other

## 2023-07-16 DIAGNOSIS — M19011 Primary osteoarthritis, right shoulder: Secondary | ICD-10-CM | POA: Diagnosis not present

## 2023-07-16 MED ORDER — ROMOSOZUMAB-AQQG 105 MG/1.17ML ~~LOC~~ SOSY
210.0000 mg | PREFILLED_SYRINGE | Freq: Once | SUBCUTANEOUS | Status: AC
Start: 2023-07-16 — End: 2023-07-16
  Administered 2023-07-16: 210 mg via SUBCUTANEOUS

## 2023-07-16 NOTE — Progress Notes (Signed)
Patient seen in office today and administered Evenity shot #3.  Both injections given in left arm subcutaneously and patient tolerated shots with no side effects today in office.

## 2023-07-20 DIAGNOSIS — M545 Low back pain, unspecified: Secondary | ICD-10-CM | POA: Diagnosis not present

## 2023-07-23 ENCOUNTER — Encounter: Payer: Self-pay | Admitting: Internal Medicine

## 2023-07-23 ENCOUNTER — Ambulatory Visit (INDEPENDENT_AMBULATORY_CARE_PROVIDER_SITE_OTHER): Payer: Medicare Other | Admitting: Internal Medicine

## 2023-07-23 ENCOUNTER — Ambulatory Visit (INDEPENDENT_AMBULATORY_CARE_PROVIDER_SITE_OTHER): Payer: Medicare Other

## 2023-07-23 VITALS — BP 122/66 | HR 90 | Temp 98.1°F | Resp 16 | Ht 65.0 in | Wt 129.0 lb

## 2023-07-23 DIAGNOSIS — E7841 Elevated Lipoprotein(a): Secondary | ICD-10-CM | POA: Diagnosis not present

## 2023-07-23 DIAGNOSIS — K219 Gastro-esophageal reflux disease without esophagitis: Secondary | ICD-10-CM | POA: Diagnosis not present

## 2023-07-23 DIAGNOSIS — Z8673 Personal history of transient ischemic attack (TIA), and cerebral infarction without residual deficits: Secondary | ICD-10-CM | POA: Diagnosis not present

## 2023-07-23 DIAGNOSIS — I10 Essential (primary) hypertension: Secondary | ICD-10-CM

## 2023-07-23 DIAGNOSIS — I639 Cerebral infarction, unspecified: Secondary | ICD-10-CM

## 2023-07-23 DIAGNOSIS — E785 Hyperlipidemia, unspecified: Secondary | ICD-10-CM | POA: Diagnosis not present

## 2023-07-23 DIAGNOSIS — M24152 Other articular cartilage disorders, left hip: Secondary | ICD-10-CM | POA: Diagnosis not present

## 2023-07-23 DIAGNOSIS — M545 Low back pain, unspecified: Secondary | ICD-10-CM | POA: Diagnosis not present

## 2023-07-23 LAB — LIPID PANEL
Cholesterol: 190 mg/dL (ref 0–200)
HDL: 49.7 mg/dL (ref >39.00)
LDL Cholesterol: 121 mg/dL — ABNORMAL HIGH (ref 0–99)
NonHDL: 140.46
Total CHOL/HDL Ratio: 4
Triglycerides: 98 mg/dL (ref 0.0–149.0)
VLDL: 19.6 mg/dL (ref 0.0–40.0)

## 2023-07-23 LAB — HEPATIC FUNCTION PANEL
ALT: 15 U/L (ref 0–35)
AST: 19 U/L (ref 0–37)
Albumin: 4.3 g/dL (ref 3.5–5.2)
Alkaline Phosphatase: 106 U/L (ref 39–117)
Bilirubin, Direct: 0 mg/dL (ref 0.0–0.3)
Total Bilirubin: 0.3 mg/dL (ref 0.2–1.2)
Total Protein: 7.6 g/dL (ref 6.0–8.3)

## 2023-07-23 LAB — CK: Total CK: 127 U/L (ref 7–177)

## 2023-07-23 MED ORDER — OXYCODONE HCL 5 MG PO TABS
5.0000 mg | ORAL_TABLET | Freq: Three times a day (TID) | ORAL | 0 refills | Status: DC | PRN
Start: 2023-07-23 — End: 2023-09-19

## 2023-07-23 MED ORDER — RABEPRAZOLE SODIUM 20 MG PO TBEC: 20.0000 mg | DELAYED_RELEASE_TABLET | Freq: Every day | ORAL | 1 refills | Status: DC

## 2023-07-23 NOTE — Progress Notes (Signed)
Subjective:  Patient ID: Connie Meyer, female    DOB: 1951/10/29  Age: 72 y.o. MRN: [id number]  CC: Hypertension, Osteoarthritis, and Hyperlipidemia   HPI Connie Meyer presents for f/up ----  Discussed the use of AI scribe software for clinical note transcription with the patient, who gave verbal consent to proceed.  History of Present Illness   The patient, with a history of hip arthritis and recent hip surgery, presents with a chronic sore throat of approximately one month's duration. Despite two visits to urgent care and treatment with steroids and antibiotics, the symptoms persist. The patient denies heartburn, indigestion, dysphagia, and odynophagia, but reports some throat clearing and a runny nose. They also report ear pain and previously swollen lymph nodes.  In addition to the sore throat, the patient reports chronic pain in the left hip, persisting for four years post-surgery. Despite extensive physical therapy, the pain remains constant and interferes with sleep. The patient has been managing the pain with tramadol, but is seeking to try oxycodone for better control.  The patient also reports a couple of recent falls, but denies dizziness or other symptoms at the time. They are currently on amlodipine and Eliquis, but have discontinued Cymbalta due to weight gain and atorvastatin for unknown reasons. The patient denies any side effects from the current medications, and denies any symptoms of high blood pressure.       Outpatient Medications Prior to Visit  Medication Sig Dispense Refill   amLODipine (NORVASC) 10 MG tablet TAKE 1 TABLET(10 MG) BY MOUTH DAILY 90 tablet 1   atorvastatin (LIPITOR) 40 MG tablet Take 1 tablet (40 mg total) by mouth daily at 6 PM. 90 tablet 3   bimatoprost (LATISSE) 0.03 % ophthalmic solution daily.     DULoxetine (CYMBALTA) 60 MG capsule TAKE 1 CAPSULE(60 MG) BY MOUTH DAILY 90 capsule 1   zolpidem (AMBIEN CR) 6.25 MG CR tablet TAKE 1  TABLET(6.25 MG) BY MOUTH AT BEDTIME AS NEEDED FOR SLEEP 90 tablet 0   ELIQUIS 5 MG TABS tablet TAKE 1 TABLET(5 MG) BY MOUTH TWICE DAILY 180 tablet 0   traMADol (ULTRAM) 50 MG tablet TAKE 1 TABLET(50 MG) BY MOUTH EVERY 6 HOURS AS NEEDED 270 tablet 0   No facility-administered medications prior to visit.    ROS Review of Systems  Constitutional:  Negative for appetite change, diaphoresis, fatigue and unexpected weight change.  HENT:  Positive for sore throat and voice change. Negative for postnasal drip, rhinorrhea, sinus pressure and trouble swallowing.   Respiratory: Negative.  Negative for cough, chest tightness, shortness of breath and wheezing.   Cardiovascular:  Negative for chest pain, palpitations and leg swelling.  Gastrointestinal: Negative.  Negative for abdominal pain, constipation, diarrhea, nausea and vomiting.  Genitourinary: Negative.   Musculoskeletal:  Positive for arthralgias. Negative for joint swelling and myalgias.  Skin: Negative.  Negative for color change and pallor.  Neurological: Negative.  Negative for dizziness and weakness.  Hematological:  Negative for adenopathy. Does not bruise/bleed easily.  Psychiatric/Behavioral:  Positive for dysphoric mood and sleep disturbance. Negative for confusion and decreased concentration. The patient is nervous/anxious.     Objective:  BP 122/66 (BP Location: Left Arm, Patient Position: Sitting, Cuff Size: Large)   Pulse 90   Temp 98.1 F (36.7 C) (Oral)   Resp 16   Ht 5\' 5"  (1.651 m)   Wt 129 lb (58.5 kg)   SpO2 95%   BMI 21.47 kg/m   BP Readings from Last  3 Encounters:  07/23/23 122/66  04/19/23 128/76  11/08/22 132/78    Wt Readings from Last 3 Encounters:  07/23/23 129 lb (58.5 kg)  04/19/23 131 lb (59.4 kg)  11/08/22 137 lb 4 oz (62.3 kg)    Physical Exam Vitals reviewed.  Constitutional:      Appearance: Normal appearance.  HENT:     Mouth/Throat:     Lips: No lesions.     Mouth: Mucous membranes  are moist.     Pharynx: Posterior oropharyngeal erythema present. No pharyngeal swelling, uvula swelling or postnasal drip.     Tonsils: No tonsillar exudate or tonsillar abscesses.     Comments: PP lymphoid hyperplasia  Eyes:     General: No scleral icterus.    Pupils: Pupils are equal, round, and reactive to light.  Neck:     Thyroid: No thyroid mass or thyromegaly.  Cardiovascular:     Rate and Rhythm: Normal rate and regular rhythm.     Heart sounds: No murmur heard.    No friction rub. No gallop.  Pulmonary:     Effort: Pulmonary effort is normal.     Breath sounds: No stridor. No wheezing, rhonchi or rales.  Abdominal:     General: Abdomen is flat.     Palpations: There is no mass.     Tenderness: There is no abdominal tenderness. There is no guarding.     Hernia: No hernia is present.  Musculoskeletal:        General: Normal range of motion.     Cervical back: Neck supple.     Right lower leg: No edema.     Left lower leg: No edema.  Lymphadenopathy:     Cervical: No cervical adenopathy.  Skin:    General: Skin is warm and dry.  Neurological:     General: No focal deficit present.     Mental Status: She is alert. Mental status is at baseline.  Psychiatric:        Mood and Affect: Mood normal.        Behavior: Behavior normal.     Lab Results  Component Value Date   WBC 5.2 04/19/2023   HGB 12.8 04/19/2023   HCT 38.6 04/19/2023   PLT 235.0 04/19/2023   GLUCOSE 90 04/19/2023   CHOL 190 07/23/2023   TRIG 98.0 07/23/2023   HDL 49.70 07/23/2023   LDLCALC 121 (H) 07/23/2023   ALT 15 07/23/2023   AST 19 07/23/2023   NA 139 04/19/2023   K 3.8 04/19/2023   CL 102 04/19/2023   CREATININE 0.85 04/19/2023   BUN 22 04/19/2023   CO2 29 04/19/2023   TSH 2.74 04/19/2023   INR 1.0 08/11/2019   HGBA1C 5.9 06/06/2021    CT ABDOMEN PELVIS WO CONTRAST  Result Date: 06/09/2023 CLINICAL DATA:  Lumbar pain and left flank pain. History of bilateral breast cancer with  double mastectomy and implants. EXAM: CT ABDOMEN AND PELVIS WITHOUT IV CONTRAST (ORAL CONTRAST ONLY) TECHNIQUE: Multidetector CT imaging of the abdomen and pelvis was performed following the standard protocol without IV contrast, following oral contrast only. RADIATION DOSE REDUCTION: This exam was performed according to the departmental dose-optimization program which includes automated exposure control, adjustment of the mA and/or kV according to patient size and/or use of iterative reconstruction technique. COMPARISON:  CTs with IV and oral contrast dated 08/01/2021 and 05/19/2016 FINDINGS: Lower chest: There is a chronic 7 mm left lower lobe nodule on 9:5, stable and consistent with a  benign entity. Lung bases again show scattered scar-like opacities without infiltrates. The heart is slightly enlarged. There is a loop recorder device in the left chest wall, small chronic pericardial effusion to the right. Old bilateral breast implants. Hepatobiliary: The unenhanced liver is homogeneous. No focal abnormality is seen without contrast. Gallbladder and bile ducts are unremarkable. Pancreas: No focal abnormality is seen without contrast. Spleen: Unremarkable without contrast.  No splenomegaly. Adrenals/Urinary Tract: There is no adrenal mass no contour deforming abnormality of either kidney. Noncontrast renal cortical attenuation is homogeneous. There is no urinary stone or obstruction. The distal left ureter and bladder wall are obscured by spray artifact from a new left hip arthroplasty. Both visualized ureters show no calcifications or dilatation. Stomach/Bowel: No dilatation or wall thickening. A 2.5 cm descending duodenal diverticulum is again shown. An appendix is not seen in this patient. Diverticulosis along the transverse and left colon is again noted but without findings of diverticulitis. Vascular/Lymphatic: Aortic atherosclerosis. No enlarged abdominal or pelvic lymph nodes. Reproductive: Status post  hysterectomy. No adnexal masses. Other: No abdominal wall hernia or abnormality. No abdominopelvic ascites. Musculoskeletal: As above, interval left hip arthroplasty. There is mild reverse S-shaped lumbar scoliosis. Osteopenia and degenerative change. No aggressive regional bone lesions. IMPRESSION: 1. No acute noncontrast CT abnormality is seen. 2. No urinary stone or obstruction is evident on the current and both prior studies. 3. Diverticulosis without evidence of diverticulitis. 4. Aortic atherosclerosis. 5. Osteopenia, lumbar scoliosis and degenerative change. 6. Chronic small pericardial effusion. Aortic Atherosclerosis (ICD10-I70.0). Electronically Signed   By: Almira Bar M.D.   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  Dispense: 90 tablet; Refill: 0  Primary hypertension- Her blood pressure is well-controlled.  Hyperlipidemia with target LDL less than 100 -     Lipoprotein A (LPA); Future -     Lipid panel; Future -     Hepatic function panel; Future -     CK; Future -     CT CARDIAC SCORING (SELF PAY ONLY); Future  H/O: CVA (cerebrovascular accident) -     Lipoprotein A (LPA); Future -     Lipid panel; Future -     Hepatic function panel; Future  LPRD (laryngopharyngeal reflux disease) -     RABEprazole Sodium; Take 1 tablet (20 mg total) by mouth daily.  Dispense: 90 tablet; Refill: 1  High serum lipoprotein(a)- Will evaluate for CAD with a CCS. -     CT CARDIAC SCORING (SELF PAY ONLY); Future     Follow-up: Return in about 4 months (around 11/23/2023).  Sanda Linger, MD

## 2023-07-24 ENCOUNTER — Other Ambulatory Visit: Payer: Self-pay | Admitting: Internal Medicine

## 2023-07-24 DIAGNOSIS — I6300 Cerebral infarction due to thrombosis of unspecified precerebral artery: Secondary | ICD-10-CM

## 2023-07-25 ENCOUNTER — Encounter (INDEPENDENT_AMBULATORY_CARE_PROVIDER_SITE_OTHER): Payer: Self-pay

## 2023-07-27 DIAGNOSIS — E7841 Elevated Lipoprotein(a): Secondary | ICD-10-CM | POA: Insufficient documentation

## 2023-08-09 ENCOUNTER — Ambulatory Visit
Admission: RE | Admit: 2023-08-09 | Discharge: 2023-08-09 | Disposition: A | Discharge status: Home / Self Care | Payer: No Typology Code available for payment source | Source: Ambulatory Visit | Attending: Internal Medicine | Admitting: Internal Medicine

## 2023-08-09 DIAGNOSIS — E785 Hyperlipidemia, unspecified: Secondary | ICD-10-CM

## 2023-08-09 DIAGNOSIS — E7841 Elevated Lipoprotein(a): Secondary | ICD-10-CM

## 2023-08-09 NOTE — Progress Notes (Signed)
Carelink Summary Report / Loop Recorder 

## 2023-08-10 ENCOUNTER — Other Ambulatory Visit: Payer: Self-pay | Admitting: Internal Medicine

## 2023-08-10 DIAGNOSIS — R931 Abnormal findings on diagnostic imaging of heart and coronary circulation: Secondary | ICD-10-CM

## 2023-08-14 ENCOUNTER — Telehealth: Payer: Self-pay | Admitting: Internal Medicine

## 2023-08-14 NOTE — Telephone Encounter (Signed)
Spoke with pt who states her results from cardiac CT has posted.  Pt advised CT calcium score was ordered by her PCP and he will provide results once he reviews.  If referral to cardiology is needed PCP will also make that recommendation.  Pt incidentally reports she currently has Covid.  Pt will contact her PCP.  Pt verbalizes understanding and thanked Charity fundraiser for the call.

## 2023-08-14 NOTE — Telephone Encounter (Signed)
Patient called to follow-up on test results. 

## 2023-08-17 ENCOUNTER — Ambulatory Visit: Payer: Medicare Other

## 2023-08-17 DIAGNOSIS — M810 Age-related osteoporosis without current pathological fracture: Secondary | ICD-10-CM

## 2023-08-17 MED ORDER — ROMOSOZUMAB-AQQG 105 MG/1.17ML ~~LOC~~ SOSY
210.0000 mg | PREFILLED_SYRINGE | Freq: Once | SUBCUTANEOUS | Status: AC
Start: 2023-08-17 — End: 2023-08-17
  Administered 2023-08-17: 210 mg via SUBCUTANEOUS

## 2023-08-17 NOTE — Progress Notes (Signed)
Pt was given Evenity Injection w/o any complications.

## 2023-08-20 ENCOUNTER — Ambulatory Visit (INDEPENDENT_AMBULATORY_CARE_PROVIDER_SITE_OTHER): Payer: Medicare Other | Admitting: Family Medicine

## 2023-08-20 ENCOUNTER — Encounter: Payer: Self-pay | Admitting: Family Medicine

## 2023-08-20 VITALS — BP 118/68 | HR 74 | Temp 97.8°F | Resp 20 | Ht 65.0 in | Wt 126.0 lb

## 2023-08-20 DIAGNOSIS — I700 Atherosclerosis of aorta: Secondary | ICD-10-CM | POA: Diagnosis not present

## 2023-08-20 DIAGNOSIS — Z8673 Personal history of transient ischemic attack (TIA), and cerebral infarction without residual deficits: Secondary | ICD-10-CM | POA: Diagnosis not present

## 2023-08-20 DIAGNOSIS — E785 Hyperlipidemia, unspecified: Secondary | ICD-10-CM

## 2023-08-20 MED ORDER — ATORVASTATIN CALCIUM 40 MG PO TABS
40.0000 mg | ORAL_TABLET | Freq: Every day | ORAL | 1 refills | Status: DC
Start: 2023-08-20 — End: 2024-06-02

## 2023-08-20 NOTE — Progress Notes (Unsigned)
Assessment & Plan:  1-3. H/O: CVA (cerebrovascular accident)/Hyperlipidemia with target LDL less than 100/Aortic atherosclerosis (HCC) Discussed with patient that she was started on atorvastatin when she had her stroke.  Upon chart review it does appear she did fill it twice.  She is agreeable to resuming.  We discussed common side effects and let us know should she experience any of these.  Education provided on high cholesterol.  Discussed her LDL has been <100 for several years but just now bumped up. - atorvastatin (LIPITOR) 40 MG tablet; Take 1 tablet (40 mg total) by mouth daily at 6 PM.  Dispense: 90 tablet; Refill: 1   Deliah Boston, MSN, APRN, FNP-C  Subjective:  HPI: Connie Meyer is a 72 y.o. female presenting on 08/20/2023 for Medical Management of Chronic Issues (Hyperlipidemia - no meds have been taken in 3 years (last from previous PCP) )  Patient is concerned about her cholesterol. She recently had lab work and a CT cardiac calcium score that showed elevated cholesterol and aortic atherosclerosis. She was told in her result note to take the statin and baby aspirin every day and is concerned because she has not had a prescription for a statin medicine in three years. Atorvastatin is on her medication list, but she states she doesn't know why it was every prescribed or if she ever took it.    ROS: Negative unless specifically indicated above in HPI.   Relevant past medical history reviewed and updated as indicated.   Allergies and medications reviewed and updated.   Current Outpatient Medications:    amLODipine (NORVASC) 10 MG tablet, TAKE 1 TABLET(10 MG) BY MOUTH DAILY, Disp: 90 tablet, Rfl: 1   bimatoprost (LATISSE) 0.03 % ophthalmic solution, daily., Disp: , Rfl:    ELIQUIS 5 MG TABS tablet, TAKE 1 TABLET(5 MG) BY MOUTH TWICE DAILY, Disp: 180 tablet, Rfl: 0   oxyCODONE (OXY IR/ROXICODONE) 5 MG immediate release tablet, Take 1 tablet (5 mg total) by mouth every 8  (eight) hours as needed for severe pain., Disp: 90 tablet, Rfl: 0   RABEprazole (ACIPHEX) 20 MG tablet, Take 1 tablet (20 mg total) by mouth daily., Disp: 90 tablet, Rfl: 1   zolpidem (AMBIEN CR) 6.25 MG CR tablet, TAKE 1 TABLET(6.25 MG) BY MOUTH AT BEDTIME AS NEEDED FOR SLEEP, Disp: 90 tablet, Rfl: 0   atorvastatin (LIPITOR) 40 MG tablet, Take 1 tablet (40 mg total) by mouth daily at 6 PM. (Patient not taking: Reported on 08/20/2023), Disp: 90 tablet, Rfl: 3  No Known Allergies  Objective:   BP 118/68   Pulse 74   Temp 97.8 F (36.6 C)   Resp 20   Ht 5\' 5"  (1.651 m)   Wt 126 lb (57.2 kg)   BMI 20.97 kg/m    Physical Exam Vitals reviewed.  Constitutional:      General: She is not in acute distress.    Appearance: Normal appearance. She is not ill-appearing, toxic-appearing or diaphoretic.  HENT:     Head: Normocephalic and atraumatic.  Eyes:     General: No scleral icterus.       Right eye: No discharge.        Left eye: No discharge.     Conjunctiva/sclera: Conjunctivae normal.  Cardiovascular:     Rate and Rhythm: Normal rate.  Pulmonary:     Effort: Pulmonary effort is normal. No respiratory distress.  Musculoskeletal:        General: Normal range of motion.  Cervical back: Normal range of motion.  Skin:    General: Skin is warm and dry.     Capillary Refill: Capillary refill takes less than 2 seconds.  Neurological:     General: No focal deficit present.     Mental Status: She is alert and oriented to person, place, and time. Mental status is at baseline.  Psychiatric:        Mood and Affect: Mood normal.        Behavior: Behavior normal.        Thought Content: Thought content normal.        Judgment: Judgment normal.

## 2023-08-23 ENCOUNTER — Encounter: Payer: Self-pay | Admitting: Family Medicine

## 2023-08-23 DIAGNOSIS — I700 Atherosclerosis of aorta: Secondary | ICD-10-CM | POA: Insufficient documentation

## 2023-08-23 LAB — CUP PACEART REMOTE DEVICE CHECK
Date Time Interrogation Session: 20240828230826
Implantable Pulse Generator Implant Date: 20200818

## 2023-08-24 ENCOUNTER — Ambulatory Visit: Payer: Medicare Other

## 2023-08-24 DIAGNOSIS — R1084 Generalized abdominal pain: Secondary | ICD-10-CM | POA: Diagnosis not present

## 2023-08-24 DIAGNOSIS — M48061 Spinal stenosis, lumbar region without neurogenic claudication: Secondary | ICD-10-CM | POA: Diagnosis not present

## 2023-08-24 DIAGNOSIS — Z5181 Encounter for therapeutic drug level monitoring: Secondary | ICD-10-CM | POA: Diagnosis not present

## 2023-08-24 DIAGNOSIS — Z79899 Other long term (current) drug therapy: Secondary | ICD-10-CM | POA: Diagnosis not present

## 2023-08-24 DIAGNOSIS — M419 Scoliosis, unspecified: Secondary | ICD-10-CM | POA: Diagnosis not present

## 2023-08-24 DIAGNOSIS — M47816 Spondylosis without myelopathy or radiculopathy, lumbar region: Secondary | ICD-10-CM | POA: Diagnosis not present

## 2023-08-24 DIAGNOSIS — R1012 Left upper quadrant pain: Secondary | ICD-10-CM | POA: Diagnosis not present

## 2023-08-28 ENCOUNTER — Ambulatory Visit (INDEPENDENT_AMBULATORY_CARE_PROVIDER_SITE_OTHER): Payer: Medicare Other

## 2023-08-28 DIAGNOSIS — I6300 Cerebral infarction due to thrombosis of unspecified precerebral artery: Secondary | ICD-10-CM | POA: Diagnosis not present

## 2023-09-10 NOTE — Progress Notes (Signed)
Carelink Summary Report / Loop Recorder 

## 2023-09-12 ENCOUNTER — Other Ambulatory Visit: Payer: Self-pay | Admitting: Internal Medicine

## 2023-09-12 ENCOUNTER — Ambulatory Visit: Payer: Medicare Other | Admitting: Internal Medicine

## 2023-09-12 ENCOUNTER — Encounter: Payer: Self-pay | Admitting: Internal Medicine

## 2023-09-12 VITALS — BP 120/72 | HR 84 | Temp 97.9°F | Ht 65.0 in | Wt 129.0 lb

## 2023-09-12 DIAGNOSIS — I10 Essential (primary) hypertension: Secondary | ICD-10-CM

## 2023-09-12 DIAGNOSIS — R0609 Other forms of dyspnea: Secondary | ICD-10-CM | POA: Diagnosis not present

## 2023-09-12 DIAGNOSIS — R7989 Other specified abnormal findings of blood chemistry: Secondary | ICD-10-CM | POA: Diagnosis not present

## 2023-09-12 LAB — CBC WITH DIFFERENTIAL/PLATELET
Basophils Absolute: 0 10*3/uL (ref 0.0–0.1)
Basophils Relative: 0.3 % (ref 0.0–3.0)
Eosinophils Absolute: 0.2 10*3/uL (ref 0.0–0.7)
Eosinophils Relative: 5.5 % — ABNORMAL HIGH (ref 0.0–5.0)
HCT: 35.8 % — ABNORMAL LOW (ref 36.0–46.0)
Hemoglobin: 11.7 g/dL — ABNORMAL LOW (ref 12.0–15.0)
Lymphocytes Relative: 22.7 % (ref 12.0–46.0)
Lymphs Abs: 1 10*3/uL (ref 0.7–4.0)
MCHC: 32.6 g/dL (ref 30.0–36.0)
MCV: 94.8 fl (ref 78.0–100.0)
Monocytes Absolute: 0.4 10*3/uL (ref 0.1–1.0)
Monocytes Relative: 9.1 % (ref 3.0–12.0)
Neutro Abs: 2.7 10*3/uL (ref 1.4–7.7)
Neutrophils Relative %: 62.4 % (ref 43.0–77.0)
Platelets: 211 10*3/uL (ref 150.0–400.0)
RBC: 3.77 Mil/uL — ABNORMAL LOW (ref 3.87–5.11)
RDW: 13.4 % (ref 11.5–15.5)
WBC: 4.4 10*3/uL (ref 4.0–10.5)

## 2023-09-12 LAB — BRAIN NATRIURETIC PEPTIDE: Pro B Natriuretic peptide (BNP): 178 pg/mL — ABNORMAL HIGH (ref 0.0–100.0)

## 2023-09-12 LAB — TROPONIN I (HIGH SENSITIVITY): High Sens Troponin I: 7 ng/L (ref 2–17)

## 2023-09-12 NOTE — Progress Notes (Addendum)
Subjective:  Patient ID: Connie Meyer, female    DOB: 1951/07/27  Age: 72 y.o. MRN: [id number]  CC: Hypertension   HPI [name] presents for f/up ----  She complains of chronic, unchanged DOE and SOB. She denies CP, diaphoresis, edema.  Outpatient Medications Prior to Visit  Medication Sig Dispense Refill   amLODipine (NORVASC) 10 MG tablet TAKE 1 TABLET(10 MG) BY MOUTH DAILY 90 tablet 1   aspirin EC 81 MG tablet Take 81 mg by mouth daily. Swallow whole.     atorvastatin (LIPITOR) 40 MG tablet Take 1 tablet (40 mg total) by mouth daily at 6 PM. 90 tablet 1   bimatoprost (LATISSE) 0.03 % ophthalmic solution daily.     ELIQUIS 5 MG TABS tablet TAKE 1 TABLET(5 MG) BY MOUTH TWICE DAILY 180 tablet 0   oxyCODONE (OXY IR/ROXICODONE) 5 MG immediate release tablet Take 1 tablet (5 mg total) by mouth every 8 (eight) hours as needed for severe pain. 90 tablet 0   RABEprazole (ACIPHEX) 20 MG tablet Take 1 tablet (20 mg total) by mouth daily. 90 tablet 1   zolpidem (AMBIEN CR) 6.25 MG CR tablet TAKE 1 TABLET(6.25 MG) BY MOUTH AT BEDTIME AS NEEDED FOR SLEEP 90 tablet 0   No facility-administered medications prior to visit.    ROS Review of Systems  Constitutional: Negative.  Negative for diaphoresis and fatigue.  HENT: Negative.    Eyes:  Negative for discharge.  Respiratory:  Positive for shortness of breath. Negative for cough, chest tightness and wheezing.   Cardiovascular:  Negative for chest pain, palpitations and leg swelling.  Gastrointestinal:  Negative for abdominal pain, constipation, diarrhea, nausea and vomiting.  Genitourinary: Negative.  Negative for difficulty urinating.  Musculoskeletal: Negative.  Negative for arthralgias.  Skin: Negative.  Negative for color change.  Neurological:  Negative for dizziness and weakness.  Hematological:  Negative for adenopathy. Does not bruise/bleed easily.  Psychiatric/Behavioral: Negative.      Objective:  BP 120/72  (BP Location: Left Arm, Patient Position: Sitting, Cuff Size: Large)   Pulse 84   Temp 97.9 F (36.6 C) (Oral)   Ht 5\' 5"  (1.651 m)   Wt 129 lb (58.5 kg)   SpO2 96%   BMI 21.47 kg/m   BP Readings from Last 3 Encounters:  09/12/23 120/72  08/20/23 118/68  07/23/23 122/66    Wt Readings from Last 3 Encounters:  09/12/23 129 lb (58.5 kg)  08/20/23 126 lb (57.2 kg)  07/23/23 129 lb (58.5 kg)    Physical Exam Vitals reviewed.  Constitutional:      Appearance: Normal appearance.  HENT:     Mouth/Throat:     Mouth: Mucous membranes are moist.  Eyes:     General: No scleral icterus.    Conjunctiva/sclera: Conjunctivae normal.  Cardiovascular:     Rate and Rhythm: Normal rate and regular rhythm.     Heart sounds: No murmur heard. Pulmonary:     Effort: Pulmonary effort is normal.     Breath sounds: No stridor. No wheezing, rhonchi or rales.  Chest:     Comments: EKG- NSR, 71 bpm No LVH, Q waves, or ST/T waves  Abdominal:     General: Abdomen is flat.     Palpations: There is no mass.     Tenderness: There is no abdominal tenderness. There is no guarding or rebound.     Hernia: No hernia is present.  Musculoskeletal:        General:  Normal range of motion.     Cervical back: Neck supple.     Right lower leg: No edema.     Left lower leg: No edema.  Lymphadenopathy:     Cervical: No cervical adenopathy.  Skin:    General: Skin is warm and dry.     Coloration: Skin is not pale.  Neurological:     General: No focal deficit present.     Mental Status: She is alert. Mental status is at baseline.  Psychiatric:        Mood and Affect: Mood normal.        Behavior: Behavior normal.     Lab Results  Component Value Date   WBC 4.4 09/12/2023   HGB 11.7 (L) 09/12/2023   HCT 35.8 (L) 09/12/2023   PLT 211.0 09/12/2023   GLUCOSE 90 04/19/2023   CHOL 190 07/23/2023   TRIG 98.0 07/23/2023   HDL 49.70 07/23/2023   LDLCALC 121 (H) 07/23/2023   ALT 15 07/23/2023   AST  19 07/23/2023   NA 139 04/19/2023   K 3.8 04/19/2023   CL 102 04/19/2023   CREATININE 0.85 04/19/2023   BUN 22 04/19/2023   CO2 29 04/19/2023   TSH 2.74 04/19/2023   INR 1.0 08/11/2019   HGBA1C 5.9 06/06/2021    CT CARDIAC SCORING ([facility] LOCATIONS ONLY)  Result Date: 08/09/2023 CLINICAL DATA:  CAD screening, intermediate CAD risk, not treadmill candidate * Tracking Code: FCC * EXAM: CT CARDIAC CORONARY ARTERY CALCIUM SCORE TECHNIQUE: Non-contrast imaging through the heart was performed using prospective ECG gating. Image post processing was performed on an independent workstation, allowing for quantitative analysis of the heart and coronary arteries. Note that this exam targets the heart and the chest was not imaged in its entirety. COMPARISON:  09/22/2016 FINDINGS: CORONARY CALCIUM SCORES: Left Main: 0 LAD: 0 LCx: 63.2 RCA: 422 Total Agatston Score: 485 MESA database percentile: 89 AORTA MEASUREMENTS: Ascending Aorta: 3.1 cm Descending Aorta:2.3 cm OTHER FINDINGS: Heart is normal size. Aorta normal caliber. Scattered aortic calcifications. No adenopathy. Peripheral interstitial thickening throughout both lungs compatible with scarring. Left lower lobe nodule posteriorly along the left hemidiaphragm measures 8 mm compared to 6 mm previously. No effusions. No acute findings in the upper abdomen. Chest wall soft tissues are unremarkable. Breast implants bilaterally partially visualized. No acute bony abnormality. IMPRESSION: Total Agatston score: 45 [place] database percentile: 89 Peripheral interstitial thickening and ground-glass opacities compatible with scarring. 8 mm left lower lobe pulmonary nodule compared to 6 mm on prior study. Minimal change over 7 years most compatible with a benign nodule. Aortic atherosclerosis. No acute extra cardiac abnormality. Electronically Signed   By: Charlett Nose M.D.   On: 08/09/2023 20:16    Assessment & Plan:   DOE (dyspnea on exertion)- EKG is normal. Troponin is  normal but BNP is mildly elevated. -     EKG 12-Lead -     Brain natriuretic peptide; Future -     Troponin I (High Sensitivity); Future -     CBC with Differential/Platelet; Future  Primary hypertension- BP is well controlled. -     EKG 12-Lead -     CBC with Differential/Platelet; Future  Elevated brain natriuretic peptide (BNP) level- She will see cardiology tomorrow.     Follow-up: Return in about 6 months (around 03/11/2024).  Sanda Linger, MD

## 2023-09-12 NOTE — Progress Notes (Unsigned)
Electrophysiology Office Note:   Date:  09/13/2023  ID:  Laurin Coder, DOB May 31, 1951, MRN [id number]  Primary Cardiologist: None Electrophysiologist: Will Jorja Loa, MD      History of Present Illness:   Connie Meyer is a 72 y.o. 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   Last seen in EP Clinic 07/2022 and was doing well. She reported she could no longer afford the device monitoring monthly.  ILR remote review on 08/22/23 showed no arrhythmias, normal histograms.   Since last being seen in our clinic the patient reports she is doing ok.  Has chronic left low back pain that she is pending a block for next week. She has not had any episodes of fast rates or known AF.  No bleeding issues on eliquis. Reports he PCP ordered a Coronary CT which showed an elevated score and she is concerned with her risk of heart attack.  She just started on a statin.  Reports chronic fatigue due to pain and ongoing DOE which is unchanged.   She denies chest pain, palpitations, dyspnea, PND, orthopnea, nausea, vomiting, dizziness, syncope, edema, weight gain, or early satiety.   Review of systems complete and found to be negative unless listed in HPI.   EP Information / Studies Reviewed:    EKG is not ordered today. EKG from 09/12/2023 reviewed which showed NSR       Studies  ECHO 05/2019 > LVEF 55-60% Carotid US 6/202 > R/L ICA with 1-39% stenosis CT Cardiac Scoring 07/2023 > total Agatston Score 485   Device  MDT ILR 08/12/2019 > cryptogenic stroke            Physical Exam:   VS:  BP 118/72   Pulse 77   Ht 5\' 5"  (1.651 m)   Wt 128 lb 6.4 oz (58.2 kg)   SpO2 98%   BMI 21.37 kg/m    Wt Readings from Last 3 Encounters:  09/13/23 128 lb 6.4 oz (58.2 kg)  09/12/23 129 lb (58.5 kg)  08/20/23 126 lb (57.2 kg)     GEN: Well nourished, well developed in no acute  distress NECK: No JVD; No carotid bruits CARDIAC: Regular rate and rhythm, no murmurs, rubs, gallops RESPIRATORY:  Clear to auscultation without rales, wheezing or rhonchi  ABDOMEN: Soft, non-tender, non-distended EXTREMITIES:  No edema; No deformity   ASSESSMENT AND PLAN:    Recurrent Cryptogenic Stroke s/p ILR  No AF to date on ILR interrogation since 09/2020 -reviewed ILR in clinic, with no new AF  -on [facility] per Neurology post CVA, eliquis 5mg  BID -not doing monthly monitoring due to cost but ok with leaving device in place  HTN  -controlled on amlodipine 10mg  daily   CAD  -per Primary MD -could consider myoview, will defer to PCP  -continue statin   Chronic Pain  -pending block  -confirmed pt has instructions from provider regarding holding OAC  Follow up with Dr. Elberta Fortis in 12 months  Signed, Canary Brim, MSN, APRN, NP-C, AGACNP-BC [facility] - Electrophysiology  09/13/2023, 9:39 AM

## 2023-09-12 NOTE — Patient Instructions (Signed)
Hypertension, Adult High blood pressure (hypertension) is when the force of blood pumping through the arteries is too strong. The arteries are the blood vessels that carry blood from the heart throughout the body. Hypertension forces the heart to work harder to pump blood and may cause arteries to become narrow or stiff. Untreated or uncontrolled hypertension can lead to a heart attack, heart failure, a stroke, kidney disease, and other problems. A blood pressure reading consists of a higher number over a lower number. Ideally, your blood pressure should be below 120/80. The first ("top") number is called the systolic pressure. It is a measure of the pressure in your arteries as your heart beats. The second ("bottom") number is called the diastolic pressure. It is a measure of the pressure in your arteries as the heart relaxes. What are the causes? The exact cause of this condition is not known. There are some conditions that result in high blood pressure. What increases the risk? Certain factors may make you more likely to develop high blood pressure. Some of these risk factors are under your control, including: Smoking. Not getting enough exercise or physical activity. Being overweight. Having too much fat, sugar, calories, or salt (sodium) in your diet. Drinking too much alcohol. Other risk factors include: Having a personal history of heart disease, diabetes, high cholesterol, or kidney disease. Stress. Having a family history of high blood pressure and high cholesterol. Having obstructive sleep apnea. Age. The risk increases with age. What are the signs or symptoms? High blood pressure may not cause symptoms. Very high blood pressure (hypertensive crisis) may cause: Headache. Fast or irregular heartbeats (palpitations). Shortness of breath. Nosebleed. Nausea and vomiting. Vision changes. Severe chest pain, dizziness, and seizures. How is this diagnosed? This condition is diagnosed by  measuring your blood pressure while you are seated, with your arm resting on a flat surface, your legs uncrossed, and your feet flat on the floor. The cuff of the blood pressure monitor will be placed directly against the skin of your upper arm at the level of your heart. Blood pressure should be measured at least twice using the same arm. Certain conditions can cause a difference in blood pressure between your right and left arms. If you have a high blood pressure reading during one visit or you have normal blood pressure with other risk factors, you may be asked to: Return on a different day to have your blood pressure checked again. Monitor your blood pressure at home for 1 week or longer. If you are diagnosed with hypertension, you may have other blood or imaging tests to help your health care provider understand your overall risk for other conditions. How is this treated? This condition is treated by making healthy lifestyle changes, such as eating healthy foods, exercising more, and reducing your alcohol intake. You may be referred for counseling on a healthy diet and physical activity. Your health care provider may prescribe medicine if lifestyle changes are not enough to get your blood pressure under control and if: Your systolic blood pressure is above 130. Your diastolic blood pressure is above 80. Your personal target blood pressure may vary depending on your medical conditions, your age, and other factors. Follow these instructions at home: Eating and drinking  Eat a diet that is high in fiber and potassium, and low in sodium, added sugar, and fat. An example of this eating plan is called the DASH diet. DASH stands for Dietary Approaches to Stop Hypertension. To eat this way: Eat  plenty of fresh fruits and vegetables. Try to fill one half of your plate at each meal with fruits and vegetables. Eat whole grains, such as whole-wheat pasta, brown rice, or whole-grain bread. Fill about one  fourth of your plate with whole grains. Eat or drink low-fat dairy products, such as skim milk or low-fat yogurt. Avoid fatty cuts of meat, processed or cured meats, and poultry with skin. Fill about one fourth of your plate with lean proteins, such as fish, chicken without skin, beans, eggs, or tofu. Avoid pre-made and processed foods. These tend to be higher in sodium, added sugar, and fat. Reduce your daily sodium intake. Many people with hypertension should eat less than 1,500 mg of sodium a day. Do not drink alcohol if: Your health care provider tells you not to drink. You are pregnant, may be pregnant, or are planning to become pregnant. If you drink alcohol: Limit how much you have to: 0-1 drink a day for women. 0-2 drinks a day for men. Know how much alcohol is in your drink. In the [place] one drink equals one 12 oz bottle of beer (355 mL), one 5 oz glass of wine (148 mL), or one 1 oz glass of hard liquor (44 mL). Lifestyle  Work with your health care provider to maintain a healthy body weight or to lose weight. Ask what an ideal weight is for you. Get at least 30 minutes of exercise that causes your heart to beat faster (aerobic exercise) most days of the week. Activities may include walking, swimming, or biking. Include exercise to strengthen your muscles (resistance exercise), such as Pilates or lifting weights, as part of your weekly exercise routine. Try to do these types of exercises for 30 minutes at least 3 days a week. Do not use any products that contain nicotine or tobacco. These products include cigarettes, chewing tobacco, and vaping devices, such as e-cigarettes. If you need help quitting, ask your health care provider. Monitor your blood pressure at home as told by your health care provider. Keep all follow-up visits. This is important. Medicines Take over-the-counter and prescription medicines only as told by your health care provider. Follow directions carefully. Blood  pressure medicines must be taken as prescribed. Do not skip doses of blood pressure medicine. Doing this puts you at risk for problems and can make the medicine less effective. Ask your health care provider about side effects or reactions to medicines that you should watch for. Contact a health care provider if you: Think you are having a reaction to a medicine you are taking. Have headaches that keep coming back (recurring). Feel dizzy. Have swelling in your ankles. Have trouble with your vision. Get help right away if you: Develop a severe headache or confusion. Have unusual weakness or numbness. Feel faint. Have severe pain in your chest or abdomen. Vomit repeatedly. Have trouble breathing. These symptoms may be an emergency. Get help right away. Call 911. Do not wait to see if the symptoms will go away. Do not drive yourself to the hospital. Summary Hypertension is when the force of blood pumping through your arteries is too strong. If this condition is not controlled, it may put you at risk for serious complications. Your personal target blood pressure may vary depending on your medical conditions, your age, and other factors. For most people, a normal blood pressure is less than 120/80. Hypertension is treated with lifestyle changes, medicines, or a combination of both. Lifestyle changes include losing weight, eating a healthy,  low-sodium diet, exercising more, and limiting alcohol. This information is not intended to replace advice given to you by your health care provider. Make sure you discuss any questions you have with your health care provider. Document Revised: 10/18/2021 Document Reviewed: 10/18/2021 Elsevier Patient Education  2024 [employer].

## 2023-09-13 ENCOUNTER — Encounter: Payer: Self-pay | Admitting: Student

## 2023-09-13 ENCOUNTER — Ambulatory Visit: Payer: Medicare Other | Attending: Student | Admitting: Pulmonary Disease

## 2023-09-13 VITALS — BP 118/72 | HR 77 | Ht 65.0 in | Wt 128.4 lb

## 2023-09-13 DIAGNOSIS — Z4509 Encounter for adjustment and management of other cardiac device: Secondary | ICD-10-CM | POA: Diagnosis not present

## 2023-09-13 DIAGNOSIS — I639 Cerebral infarction, unspecified: Secondary | ICD-10-CM

## 2023-09-13 DIAGNOSIS — I10 Essential (primary) hypertension: Secondary | ICD-10-CM | POA: Diagnosis not present

## 2023-09-13 NOTE — Patient Instructions (Signed)
Medication Instructions:  Your physician recommends that you continue on your current medications as directed. Please refer to the Current Medication list given to you today.  *If you need a refill on your cardiac medications before your next appointment, please call your pharmacy*  Lab Work: None ordered If you have labs (blood work) drawn today and your tests are completely normal, you will receive your results only by: MyChart Message (if you have MyChart) OR A paper copy in the mail If you have any lab test that is abnormal or we need to change your treatment, we will call you to review the results.  Follow-Up: At [facility], you and your health needs are our priority.  As part of our continuing mission to provide you with exceptional heart care, we have created designated Provider Care Teams.  These Care Teams include your primary Cardiologist (physician) and Advanced Practice Providers (APPs -  Physician Assistants and Nurse Practitioners) who all work together to provide you with the care you need, when you need it.  Your next appointment:   1 year(s)  Provider:   Loman Brooklyn, MD

## 2023-09-14 ENCOUNTER — Ambulatory Visit (INDEPENDENT_AMBULATORY_CARE_PROVIDER_SITE_OTHER): Payer: Medicare Other

## 2023-09-14 DIAGNOSIS — M810 Age-related osteoporosis without current pathological fracture: Secondary | ICD-10-CM

## 2023-09-14 MED ORDER — ROMOSOZUMAB-AQQG 105 MG/1.17ML ~~LOC~~ SOSY
210.0000 mg | PREFILLED_SYRINGE | Freq: Once | SUBCUTANEOUS | Status: AC
Start: 2023-09-14 — End: 2023-09-14
  Administered 2023-09-14: 210 mg via SUBCUTANEOUS

## 2023-09-14 NOTE — Progress Notes (Signed)
Pt was given Evenity Injection w/o any complications.

## 2023-09-18 DIAGNOSIS — R102 Pelvic and perineal pain: Secondary | ICD-10-CM | POA: Diagnosis not present

## 2023-09-18 DIAGNOSIS — G8929 Other chronic pain: Secondary | ICD-10-CM | POA: Diagnosis not present

## 2023-09-18 DIAGNOSIS — R1084 Generalized abdominal pain: Secondary | ICD-10-CM | POA: Diagnosis not present

## 2023-09-19 ENCOUNTER — Ambulatory Visit (INDEPENDENT_AMBULATORY_CARE_PROVIDER_SITE_OTHER): Payer: Medicare Other | Admitting: Nurse Practitioner

## 2023-09-19 ENCOUNTER — Telehealth: Payer: Self-pay | Admitting: Internal Medicine

## 2023-09-19 ENCOUNTER — Encounter (HOSPITAL_BASED_OUTPATIENT_CLINIC_OR_DEPARTMENT_OTHER): Payer: Self-pay | Admitting: Nurse Practitioner

## 2023-09-19 ENCOUNTER — Other Ambulatory Visit: Payer: Self-pay | Admitting: Internal Medicine

## 2023-09-19 VITALS — BP 112/68 | HR 79 | Ht 64.0 in | Wt 128.6 lb

## 2023-09-19 DIAGNOSIS — R072 Precordial pain: Secondary | ICD-10-CM

## 2023-09-19 DIAGNOSIS — I6300 Cerebral infarction due to thrombosis of unspecified precerebral artery: Secondary | ICD-10-CM

## 2023-09-19 DIAGNOSIS — M24152 Other articular cartilage disorders, left hip: Secondary | ICD-10-CM

## 2023-09-19 DIAGNOSIS — R931 Abnormal findings on diagnostic imaging of heart and coronary circulation: Secondary | ICD-10-CM | POA: Diagnosis not present

## 2023-09-19 DIAGNOSIS — I2510 Atherosclerotic heart disease of native coronary artery without angina pectoris: Secondary | ICD-10-CM

## 2023-09-19 DIAGNOSIS — E7841 Elevated Lipoprotein(a): Secondary | ICD-10-CM

## 2023-09-19 DIAGNOSIS — E785 Hyperlipidemia, unspecified: Secondary | ICD-10-CM | POA: Diagnosis not present

## 2023-09-19 DIAGNOSIS — R079 Chest pain, unspecified: Secondary | ICD-10-CM | POA: Diagnosis not present

## 2023-09-19 MED ORDER — METOPROLOL TARTRATE 50 MG PO TABS: ORAL_TABLET | ORAL | 0 refills | Status: DC

## 2023-09-19 MED ORDER — OXYCODONE HCL 5 MG PO TABS
5.0000 mg | ORAL_TABLET | Freq: Three times a day (TID) | ORAL | 0 refills | Status: DC | PRN
Start: 2023-09-19 — End: 2023-11-01

## 2023-09-19 NOTE — Telephone Encounter (Signed)
Prescription Request  09/19/2023  LOV: 09/12/2023  What is the name of the medication or equipment? oxycodone  Have you contacted your pharmacy to request a refill? Yes   Which pharmacy would you like this sent to?  [id number] Ginette Otto, [place] - [address] AT [facility] OF [facility] [address] [place] [address] Phone: [phone] Fax: [phone]    Patient notified that their request is being sent to the clinical staff for review and that they should receive a response within 2 business days.   Please advise at Mobile [phone] (mobile)

## 2023-09-19 NOTE — Progress Notes (Signed)
Cardiology Office Note:  .   Date:  09/19/2023 ID:  Connie Meyer, DOB 1951/03/27, MRN [id number] PCP: Connie Grandchild, MD [facility] Providers Cardiologist:  None   Patient Profile: .      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 female  who is here today for new patient consult for elevated coronary artery calcium score. CT calcium score completed 08/09/2023 revealed CAC 485 Agatston score which is 89th percentile for age/sex matched controls.   She established with EP cardiology following cryptogenic stroke June 2020.  During that time she had loop recorder implanted, strangely the loop apparently migrated out and was lost.  Unfortunately a second cryptogenic stroke occurred 08/12/2019 and another loop was implanted though inferiorly noting that the skin superior to breast was quite thin/fragile secondary to radiation therapy.  Ultimately, neurology decided to stop her enrollment in [place] study (apixaban 2.5 mg versus placebo) and started her on Eliquis. She has had no atrial fibrillation today on ILR interrogation since 09/2020.  Per neurology she is on Eliquis 5 mg twice daily post CVA. There has been no evidence of atrial fibrillation per chart review.  She has maintained consistent follow-up.  Today, she reports she had forgotten about this appointment but is obviously very concerned about the elevated calcium score. She is tearful when talking about chronic left side pain for at least 4 years. She had a block procedure yesterday at pain clinic in [facility] which has not given her any relief. Years of testing and  no one can find the source of her pain. She lives alone. Prior to 2020, she walked 4 miles daily. No residual effects from strokes. Has stairs in her home which she is up and down regularly. Has some shortness of breath and chest discomfort with stair climbing and also at times when doing yard work. Was started on atorvastatin recently.  She denies palpitations, dyspnea, orthopnea, PND, presyncope, syncope.  Family history: Her family history includes Heart attack in her father; Hypertension in her father and mother.   ASCVD Risk Score: The ASCVD Risk score (Arnett DK, et al., 2019) failed to calculate for the following reasons:   The patient has a prior MI or stroke diagnosis  PREVENT Calculator: 27.1%  Diet: Previously used quick options since she is cooking only for herself Occasionally ordered meals from online services such as [employer] Now eating more vegetables, fruits, whole grains   Activity: Constant pain limits her activities House work and yard work, specifics not provided  ROS: See HPI       Studies Reviewed: .           Risk Assessment/Calculations:             Physical Exam:   VS: BP 112/68   Pulse 79   Ht 5\' 4"  (1.626 m)   Wt 128 lb 9.6 oz (58.3 kg)   SpO2 98%   BMI 22.07 kg/m   Wt Readings from Last 3 Encounters:  09/19/23 128 lb 9.6 oz (58.3 kg)  09/13/23 128 lb 6.4 oz (58.2 kg)  09/12/23 129 lb (58.5 kg)     GEN:  Well nourished, well developed in no acute distress NECK: No JVD; No carotid bruits CARDIAC:  RRR, no murmurs, rubs, gallops RESPIRATORY:  Clear to auscultation without rales, wheezing or rhonchi  ABDOMEN: Soft, non-tender, non-distended EXTREMITIES:  No edema; No deformity     ASSESSMENT AND PLAN: .    CAD/Elevated coronary artery calcium score: CT calcium score completed 08/09/2023 reveals total Agatston score of 485 (89th percentile).  She is physically limited by constant pain in her left side. Is active at home and in her yard.  Has occasional shortness of breath and chest pain with exertion. No n/v, diaphoresis associated. Question of whether this is angina equivalent. Due to high CV risk score, elevated CAC score, history of stroke, family history, and history of hypertension we will get coronary CTA to evaluate for ischemia. Will have her take lopressor 50 mg prior. Continue aspirin, amlodipine and atorvastatin. Goal for LDL is < 70, ideally < 55.   Elevated lipoprotein a: Reviewed this finding as an additional risk factor for CV disease. Encouraged her to have her adult children tested.   Hyperlipidemia LDL goal < 70: LDL 121, TGs 98, HDL 49.7 on 07/23/23. She was recently started on atorvastatin.  Would like to add PCSK9 inhibitor therapy due to elevated LP(a).  Will send Rx to insurance to seek prior authorization. General dosing instructions provided while in the office.  Continue atorvastatin.  CV Risk Assessment: 10 year risk of CVD is 27.1% using the PREVENT calculator.  She has already had 2 cryptogenic strokes. Goal to get her on PCSK9i for elevated lipoprotein a and consider addition of additional agent in the future if available.    Plan/Goals: Continue heart healthy options including whole grains, fruits, vegetables, and lean protein.   Activity:  She is limited by chronic pain. Encouraged as much movement or resistance training as possible.        Dispo: 3 months with me  Signed, Eligha Bridegroom, NP-C

## 2023-09-19 NOTE — Patient Instructions (Signed)
Medication Instructions:   START Repatha every 14 days ( 140 mg) injection.     *If you need a refill on your cardiac medications before your next appointment, please call your pharmacy*   Lab Work:  Please discuss with your children Lipoprotein A (LPA).   If you have labs (blood work) drawn today and your tests are completely normal, you will receive your results only by: MyChart Message (if you have MyChart) OR A paper copy in the mail If you have any lab test that is abnormal or we need to change your treatment, we will call you to review the results.   Testing/Procedures:    Your cardiac CT will be scheduled at one of the below locations:   [facility] [address] [place], [place] [address] [phone]  If scheduled at [facility], please arrive at the [facility]'s Entrance (Entrance C2) of [facility] 30 minutes prior to test start time. You can use the FREE valet parking offered at entrance C (encouraged to control the heart rate for the test)  Proceed to the [facility] Radiology Department (first floor) to check-in and test prep.  All radiology patients and guests should use entrance C2 at [facility], accessed from [facility], even though the hospital's physical address listed is [address].    There is spacious parking and easy access to the radiology department from the [facility] entrance. Please enter here and check-in with the desk attendant.   Please follow these instructions carefully (unless otherwise directed):  An IV will be required for this test and Nitroglycerin will be given.   On the Night Before the Test: Be sure to Drink plenty of water. Do not consume any caffeinated/decaffeinated beverages or chocolate 12 hours prior to your test. Do not take any antihistamines 12 hours prior to your test.  On the Day of the Test: Drink plenty of water until 1 hour  prior to the test. Do not eat any food 1 hour prior to test. You may take your regular medications prior to the test.  Take metoprolol (Lopressor) one tablet by mouth ( 50 mg) two hours prior to test. FEMALES- please wear underwire-free bra if available, avoid dresses & tight clothing      After the Test: Drink plenty of water. After receiving IV contrast, you may experience a mild flushed feeling. This is normal. On occasion, you may experience a mild rash up to 24 hours after the test. This is not dangerous. If this occurs, you can take Benadryl 25 mg and increase your fluid intake. If you experience trouble breathing, this can be serious. If it is severe call 911 IMMEDIATELY. If it is mild, please call our office. We will call to schedule your test 2-4 weeks out understanding that some insurance companies will need an authorization prior to the service being performed.   For more information and frequently asked questions, please visit our website : [email or web address]  For non-scheduling related questions, please contact the cardiac imaging nurse navigator should you have any questions/concerns: Cardiac Imaging Nurse Navigators Direct Office Dial: [phone]   For scheduling needs, including cancellations and rescheduling, please call [place], [phone].    Follow-Up: At [facility], you and your health needs are our priority.  As part of our continuing mission to provide you with exceptional heart care, we have created designated Provider Care Teams.  These Care Teams include your primary Cardiologist (  physician) and Advanced Practice Providers (APPs -  Physician Assistants and Nurse Practitioners) who all work together to provide you with the care you need, when you need it.  We recommend signing up for the patient portal called "MyChart".  Sign up information is provided on this After Visit Summary.  MyChart is used to connect with patients for Virtual  Visits (Telemedicine).  Patients are able to view lab/test results, encounter notes, upcoming appointments, etc.  Non-urgent messages can be sent to your provider as well.   To learn more about what you can do with MyChart, go to [email or web address].    Your next appointment:   3 month(s)  Provider:   Eligha Bridegroom, NP    Other Instructions

## 2023-09-20 LAB — BASIC METABOLIC PANEL
BUN/Creatinine Ratio: 19 (ref 12–28)
BUN: 18 mg/dL (ref 8–27)
CO2: 21 mmol/L (ref 20–29)
Calcium: 9.2 mg/dL (ref 8.7–10.3)
Chloride: 104 mmol/L (ref 96–106)
Creatinine, Ser: 0.94 mg/dL (ref 0.57–1.00)
Glucose: 89 mg/dL (ref 70–99)
Potassium: 4.5 mmol/L (ref 3.5–5.2)
Sodium: 142 mmol/L (ref 134–144)
eGFR: 64 mL/min/{1.73_m2} (ref >59)

## 2023-09-25 ENCOUNTER — Other Ambulatory Visit: Payer: Self-pay | Admitting: Internal Medicine

## 2023-09-25 DIAGNOSIS — F5104 Psychophysiologic insomnia: Secondary | ICD-10-CM

## 2023-09-26 LAB — CUP PACEART REMOTE DEVICE CHECK
Date Time Interrogation Session: 20240930231808
Implantable Pulse Generator Implant Date: 20200818

## 2023-09-27 ENCOUNTER — Encounter (HOSPITAL_COMMUNITY): Payer: Self-pay

## 2023-09-28 ENCOUNTER — Telehealth (HOSPITAL_COMMUNITY): Payer: Self-pay | Admitting: *Deleted

## 2023-09-28 DIAGNOSIS — R1032 Left lower quadrant pain: Secondary | ICD-10-CM | POA: Diagnosis not present

## 2023-09-28 NOTE — Telephone Encounter (Signed)
Reaching out to patient to offer assistance regarding upcoming cardiac imaging study; pt verbalizes understanding of appt date/time, parking situation and where to check in, pre-test NPO status and medications ordered, and verified current allergies; name and call back number provided for further questions should they arise Hayley Sharpe RN Navigator Cardiac Imaging [facility] [phone] office [phone] cell  

## 2023-10-01 ENCOUNTER — Ambulatory Visit (INDEPENDENT_AMBULATORY_CARE_PROVIDER_SITE_OTHER): Payer: Medicare Other

## 2023-10-01 ENCOUNTER — Ambulatory Visit (HOSPITAL_COMMUNITY)
Admission: RE | Admit: 2023-10-01 | Discharge: 2023-10-01 | Disposition: A | Discharge status: Home / Self Care | Payer: Medicare Other | Source: Ambulatory Visit | Attending: Nurse Practitioner | Admitting: Nurse Practitioner

## 2023-10-01 ENCOUNTER — Ambulatory Visit (HOSPITAL_BASED_OUTPATIENT_CLINIC_OR_DEPARTMENT_OTHER)
Admission: RE | Admit: 2023-10-01 | Discharge: 2023-10-01 | Disposition: A | Discharge status: Home / Self Care | Payer: Medicare Other | Source: Ambulatory Visit | Attending: Cardiology | Admitting: Cardiology

## 2023-10-01 ENCOUNTER — Other Ambulatory Visit: Payer: Self-pay | Admitting: Cardiology

## 2023-10-01 DIAGNOSIS — R931 Abnormal findings on diagnostic imaging of heart and coronary circulation: Secondary | ICD-10-CM | POA: Insufficient documentation

## 2023-10-01 DIAGNOSIS — I2510 Atherosclerotic heart disease of native coronary artery without angina pectoris: Secondary | ICD-10-CM

## 2023-10-01 DIAGNOSIS — I6300 Cerebral infarction due to thrombosis of unspecified precerebral artery: Secondary | ICD-10-CM | POA: Diagnosis not present

## 2023-10-01 DIAGNOSIS — R072 Precordial pain: Secondary | ICD-10-CM

## 2023-10-01 MED ORDER — NITROGLYCERIN 0.4 MG SL SUBL
SUBLINGUAL_TABLET | SUBLINGUAL | Status: AC
  Filled 2023-10-01: qty 2

## 2023-10-01 MED ORDER — IOHEXOL 350 MG/ML SOLN
95.0000 mL | Freq: Once | INTRAVENOUS | Status: AC | PRN
  Administered 2023-10-01: 95 mL via INTRAVENOUS

## 2023-10-01 MED ORDER — NITROGLYCERIN 0.4 MG SL SUBL
0.8000 mg | SUBLINGUAL_TABLET | Freq: Once | SUBLINGUAL | Status: AC
  Administered 2023-10-01: 0.8 mg via SUBLINGUAL

## 2023-10-02 DIAGNOSIS — R1084 Generalized abdominal pain: Secondary | ICD-10-CM | POA: Insufficient documentation

## 2023-10-05 ENCOUNTER — Other Ambulatory Visit (HOSPITAL_COMMUNITY): Payer: Self-pay

## 2023-10-05 ENCOUNTER — Telehealth: Payer: Self-pay | Admitting: Pharmacy Technician

## 2023-10-05 ENCOUNTER — Telehealth: Payer: Self-pay | Admitting: Nurse Practitioner

## 2023-10-05 NOTE — Telephone Encounter (Signed)
Pharmacy Patient Advocate Encounter  Received notification from [facility] that Prior Authorization for repatha has been APPROVED from 10/05/23 to 10/04/24. Ran test claim, Copay is $88.35. This test claim was processed through [facility]- copay amounts may vary at other pharmacies due to pharmacy/plan contracts, or as the patient moves through the different stages of their insurance plan.   PA #/Case ID/Reference #: [id number]

## 2023-10-05 NOTE — Telephone Encounter (Signed)
Patient would like to start PCSK9 inhibitor. Will you please see which agent is most cost effective? She has Lp(a) of 341 and CAD.

## 2023-10-05 NOTE — Telephone Encounter (Signed)
Please notify patient that Repatha would be 88.35 per month. Would recommend that she start if this is financially feasible for her. It is also possible that it is less expensive at a different pharmacy. We can send wherever she wants.

## 2023-10-05 NOTE — Telephone Encounter (Signed)
Pharmacy Patient Advocate Encounter   Received notification from Pt Calls Messages that prior authorization for repatha is required/requested.   Insurance verification completed.   The patient is insured through [facility] .   Per test claim: PA required; PA submitted to [facility] via CoverMyMeds Key/confirmation #/EOC [id number] Status is pending

## 2023-10-08 ENCOUNTER — Telehealth: Payer: Self-pay

## 2023-10-08 NOTE — Telephone Encounter (Signed)
Patient is cleared for EVENITY injection on 10/15/2023 per PA information on 05/04/2023.  Mentioned in provider note last on 12/2022. Medication is CLINIC supplied. CoPay:$0

## 2023-10-08 NOTE — Telephone Encounter (Signed)
Lvm with pt's recommendations and where to send Repatha to if pt wants to start.

## 2023-10-15 ENCOUNTER — Ambulatory Visit: Payer: Medicare Other

## 2023-10-16 DIAGNOSIS — G8929 Other chronic pain: Secondary | ICD-10-CM | POA: Diagnosis not present

## 2023-10-16 DIAGNOSIS — R1084 Generalized abdominal pain: Secondary | ICD-10-CM | POA: Diagnosis not present

## 2023-10-17 ENCOUNTER — Other Ambulatory Visit (HOSPITAL_BASED_OUTPATIENT_CLINIC_OR_DEPARTMENT_OTHER): Payer: Self-pay | Admitting: *Deleted

## 2023-10-17 DIAGNOSIS — I2510 Atherosclerotic heart disease of native coronary artery without angina pectoris: Secondary | ICD-10-CM

## 2023-10-17 DIAGNOSIS — E785 Hyperlipidemia, unspecified: Secondary | ICD-10-CM

## 2023-10-17 DIAGNOSIS — E7841 Elevated Lipoprotein(a): Secondary | ICD-10-CM

## 2023-10-17 DIAGNOSIS — I6300 Cerebral infarction due to thrombosis of unspecified precerebral artery: Secondary | ICD-10-CM

## 2023-10-17 DIAGNOSIS — R931 Abnormal findings on diagnostic imaging of heart and coronary circulation: Secondary | ICD-10-CM

## 2023-10-17 MED ORDER — REPATHA SURECLICK 140 MG/ML ~~LOC~~ SOAJ: 140.0000 mg | SUBCUTANEOUS | 3 refills | Status: DC

## 2023-10-17 NOTE — Telephone Encounter (Signed)
S/w pt is starting Repatha ( 140 mg) subcutaneous q 14 days, sent to requested pharmacy.  PA added to pharm notes in refill. Pt will come in week before appt in December, fasting for NMR/CMET. Released and mailing requisition and instructions for lab appt. Verified address.

## 2023-10-18 NOTE — Progress Notes (Signed)
Carelink Summary Report / Loop Recorder 

## 2023-10-31 LAB — CUP PACEART REMOTE DEVICE CHECK
Date Time Interrogation Session: 20241102230029
Implantable Pulse Generator Implant Date: 20200818

## 2023-11-01 ENCOUNTER — Other Ambulatory Visit: Payer: Self-pay

## 2023-11-01 ENCOUNTER — Telehealth: Payer: Self-pay | Admitting: Internal Medicine

## 2023-11-01 DIAGNOSIS — M24152 Other articular cartilage disorders, left hip: Secondary | ICD-10-CM

## 2023-11-01 MED ORDER — OXYCODONE HCL 5 MG PO TABS: 5.0000 mg | ORAL_TABLET | Freq: Three times a day (TID) | ORAL | 0 refills | Status: DC | PRN

## 2023-11-01 NOTE — Telephone Encounter (Signed)
 Medication refill sent to Dr. Yetta Barre

## 2023-11-01 NOTE — Telephone Encounter (Signed)
Next OV is 11/26/2023.   Prescription Request  11/01/2023  LOV: 09/12/2023  What is the name of the medication or equipment?  oxyCODONE (OXY IR/ROXICODONE) 5 MG immediate release tablet   Have you contacted your pharmacy to request a refill? Yes   Which pharmacy would you like this sent to?  [facility] #[id number] Gin[place] Otto, [place] - [address] AT [facility] OF [employer] [address] [place] [address] Phone: [phone] Fax: [phone]    Patient notified that their request is being sent to the clinical staff for review and that they should receive a response within 2 business days.   Please advise at Mobile [phone] (mobile)

## 2023-11-05 ENCOUNTER — Ambulatory Visit: Payer: Medicare Other

## 2023-11-05 ENCOUNTER — Ambulatory Visit (INDEPENDENT_AMBULATORY_CARE_PROVIDER_SITE_OTHER): Payer: Medicare Other

## 2023-11-05 DIAGNOSIS — I6300 Cerebral infarction due to thrombosis of unspecified precerebral artery: Secondary | ICD-10-CM | POA: Diagnosis not present

## 2023-11-05 DIAGNOSIS — M810 Age-related osteoporosis without current pathological fracture: Secondary | ICD-10-CM

## 2023-11-05 MED ORDER — ROMOSOZUMAB-AQQG 105 MG/1.17ML ~~LOC~~ SOSY
210.0000 mg | PREFILLED_SYRINGE | Freq: Once | SUBCUTANEOUS | Status: AC
  Administered 2023-11-05: 210 mg via SUBCUTANEOUS

## 2023-11-05 NOTE — Progress Notes (Signed)
After obtaining consent, and per orders of Dr. Jones, injection of Evenity given by Regla Fitzgibbon P Correy Weidner. Patient instructed to report any adverse reaction to me immediately.  

## 2023-11-20 ENCOUNTER — Telehealth: Payer: Self-pay

## 2023-11-20 NOTE — Telephone Encounter (Signed)
Please provide updated PA for EVENITY injection schedule on 12/12

## 2023-11-20 NOTE — Telephone Encounter (Signed)
Patient is cleared for EVENITY injection on 12/06/2023 -No PA needded.  Mentioned in provider note last on 12/2022. Medication is CLINIC supplied. CoPay:$0

## 2023-11-26 ENCOUNTER — Ambulatory Visit (INDEPENDENT_AMBULATORY_CARE_PROVIDER_SITE_OTHER): Payer: Medicare Other | Admitting: Internal Medicine

## 2023-11-26 ENCOUNTER — Encounter: Payer: Self-pay | Admitting: Internal Medicine

## 2023-11-26 VITALS — BP 138/84 | HR 74 | Temp 98.1°F | Resp 16 | Ht 64.0 in | Wt 130.0 lb

## 2023-11-26 DIAGNOSIS — R0609 Other forms of dyspnea: Secondary | ICD-10-CM | POA: Diagnosis not present

## 2023-11-26 DIAGNOSIS — I10 Essential (primary) hypertension: Secondary | ICD-10-CM

## 2023-11-26 DIAGNOSIS — R011 Cardiac murmur, unspecified: Secondary | ICD-10-CM | POA: Diagnosis not present

## 2023-11-26 NOTE — Patient Instructions (Signed)
Hypertension, Adult High blood pressure (hypertension) is when the force of blood pumping through the arteries is too strong. The arteries are the blood vessels that carry blood from the heart throughout the body. Hypertension forces the heart to work harder to pump blood and may cause arteries to become narrow or stiff. Untreated or uncontrolled hypertension can lead to a heart attack, heart failure, a stroke, kidney disease, and other problems. A blood pressure reading consists of a higher number over a lower number. Ideally, your blood pressure should be below 120/80. The first ("top") number is called the systolic pressure. It is a measure of the pressure in your arteries as your heart beats. The second ("bottom") number is called the diastolic pressure. It is a measure of the pressure in your arteries as the heart relaxes. What are the causes? The exact cause of this condition is not known. There are some conditions that result in high blood pressure. What increases the risk? Certain factors may make you more likely to develop high blood pressure. Some of these risk factors are under your control, including: Smoking. Not getting enough exercise or physical activity. Being overweight. Having too much fat, sugar, calories, or salt (sodium) in your diet. Drinking too much alcohol. Other risk factors include: Having a personal history of heart disease, diabetes, high cholesterol, or kidney disease. Stress. Having a family history of high blood pressure and high cholesterol. Having obstructive sleep apnea. Age. The risk increases with age. What are the signs or symptoms? High blood pressure may not cause symptoms. Very high blood pressure (hypertensive crisis) may cause: Headache. Fast or irregular heartbeats (palpitations). Shortness of breath. Nosebleed. Nausea and vomiting. Vision changes. Severe chest pain, dizziness, and seizures. How is this diagnosed? This condition is diagnosed by  measuring your blood pressure while you are seated, with your arm resting on a flat surface, your legs uncrossed, and your feet flat on the floor. The cuff of the blood pressure monitor will be placed directly against the skin of your upper arm at the level of your heart. Blood pressure should be measured at least twice using the same arm. Certain conditions can cause a difference in blood pressure between your right and left arms. If you have a high blood pressure reading during one visit or you have normal blood pressure with other risk factors, you may be asked to: Return on a different day to have your blood pressure checked again. Monitor your blood pressure at home for 1 week or longer. If you are diagnosed with hypertension, you may have other blood or imaging tests to help your health care provider understand your overall risk for other conditions. How is this treated? This condition is treated by making healthy lifestyle changes, such as eating healthy foods, exercising more, and reducing your alcohol intake. You may be referred for counseling on a healthy diet and physical activity. Your health care provider may prescribe medicine if lifestyle changes are not enough to get your blood pressure under control and if: Your systolic blood pressure is above 130. Your diastolic blood pressure is above 80. Your personal target blood pressure may vary depending on your medical conditions, your age, and other factors. Follow these instructions at home: Eating and drinking  Eat a diet that is high in fiber and potassium, and low in sodium, added sugar, and fat. An example of this eating plan is called the DASH diet. DASH stands for Dietary Approaches to Stop Hypertension. To eat this way: Eat   plenty of fresh fruits and vegetables. Try to fill one half of your plate at each meal with fruits and vegetables. Eat whole grains, such as whole-wheat pasta, brown rice, or whole-grain bread. Fill about one  fourth of your plate with whole grains. Eat or drink low-fat dairy products, such as skim milk or low-fat yogurt. Avoid fatty cuts of meat, processed or cured meats, and poultry with skin. Fill about one fourth of your plate with lean proteins, such as fish, chicken without skin, beans, eggs, or tofu. Avoid pre-made and processed foods. These tend to be higher in sodium, added sugar, and fat. Reduce your daily sodium intake. Many people with hypertension should eat less than 1,500 mg of sodium a day. Do not drink alcohol if: Your health care provider tells you not to drink. You are pregnant, may be pregnant, or are planning to become pregnant. If you drink alcohol: Limit how much you have to: 0-1 drink a day for women. 0-2 drinks a day for men. Know how much alcohol is in your drink. In the [place] one drink equals one 12 oz bottle of beer (355 mL), one 5 oz glass of wine (148 mL), or one 1 oz glass of hard liquor (44 mL). Lifestyle  Work with your health care provider to maintain a healthy body weight or to lose weight. Ask what an ideal weight is for you. Get at least 30 minutes of exercise that causes your heart to beat faster (aerobic exercise) most days of the week. Activities may include walking, swimming, or biking. Include exercise to strengthen your muscles (resistance exercise), such as Pilates or lifting weights, as part of your weekly exercise routine. Try to do these types of exercises for 30 minutes at least 3 days a week. Do not use any products that contain nicotine or tobacco. These products include cigarettes, chewing tobacco, and vaping devices, such as e-cigarettes. If you need help quitting, ask your health care provider. Monitor your blood pressure at home as told by your health care provider. Keep all follow-up visits. This is important. Medicines Take over-the-counter and prescription medicines only as told by your health care provider. Follow directions carefully. Blood  pressure medicines must be taken as prescribed. Do not skip doses of blood pressure medicine. Doing this puts you at risk for problems and can make the medicine less effective. Ask your health care provider about side effects or reactions to medicines that you should watch for. Contact a health care provider if you: Think you are having a reaction to a medicine you are taking. Have headaches that keep coming back (recurring). Feel dizzy. Have swelling in your ankles. Have trouble with your vision. Get help right away if you: Develop a severe headache or confusion. Have unusual weakness or numbness. Feel faint. Have severe pain in your chest or abdomen. Vomit repeatedly. Have trouble breathing. These symptoms may be an emergency. Get help right away. Call 911. Do not wait to see if the symptoms will go away. Do not drive yourself to the hospital. Summary Hypertension is when the force of blood pumping through your arteries is too strong. If this condition is not controlled, it may put you at risk for serious complications. Your personal target blood pressure may vary depending on your medical conditions, your age, and other factors. For most people, a normal blood pressure is less than 120/80. Hypertension is treated with lifestyle changes, medicines, or a combination of both. Lifestyle changes include losing weight, eating a healthy,   low-sodium diet, exercising more, and limiting alcohol. This information is not intended to replace advice given to you by your health care provider. Make sure you discuss any questions you have with your health care provider. Document Revised: 10/18/2021 Document Reviewed: 10/18/2021 Elsevier Patient Education  2024 Elsevier Inc.  

## 2023-11-26 NOTE — Progress Notes (Unsigned)
Subjective:  Patient ID: Connie Meyer, female    DOB: [birth year]  Age: 72 y.o. MRN: [id number]  CC: Hypertension and Back Pain   HPI Connie Meyer presents for f/up ---  Discussed the use of AI scribe software for clinical note transcription with the patient, who gave verbal consent to proceed.  History of Present Illness   The patient, with a history of cardiac disease and chronic pain, reports persistent insomnia despite the use of Ambien. She describes her sleep as poor but is unable to identify a specific cause.  The patient has been receiving injections from a pain clinic in [facility], under the care of Dr. Cherrie Distance. She understands these injections to be diagnostic rather than therapeutic but expresses confusion about the process and the lack of feedback on any observed changes in her condition. The injections are administered in the patient's back. She relies heavily on Oxycodone for pain management, taking two doses daily, and expresses fear about potential discontinuation of this medication.  The patient reports no cardiac symptoms such as dizziness, lightheadedness, palpitations, or chest pain. However, she does experience shortness of breath, particularly during exercise, which leaves her feeling extremely out of breath. She denies any associated cough. She assumes the shortness of breath is cardiac in origin, but it is unclear if this has been confirmed by her cardiologist. She has an upcoming appointment with her cardiologist in the first week of December.  The patient also mentions a blockage in her right artery but does not provide further details.       Outpatient Medications Prior to Visit  Medication Sig Dispense Refill   amLODipine (NORVASC) 10 MG tablet TAKE 1 TABLET(10 MG) BY MOUTH DAILY 90 tablet 1   aspirin EC 81 MG tablet Take 81 mg by mouth daily. Swallow whole.     atorvastatin (LIPITOR) 40 MG tablet Take 1 tablet (40 mg total) by mouth daily at 6  PM. 90 tablet 1   bimatoprost (LATISSE) 0.03 % ophthalmic solution daily.     ELIQUIS 5 MG TABS tablet TAKE 1 TABLET(5 MG) BY MOUTH TWICE DAILY 180 tablet 0   Evolocumab (REPATHA SURECLICK) 140 MG/ML SOAJ Inject 140 mg into the skin every 14 (fourteen) days. 6 mL 3   metoprolol tartrate (LOPRESSOR) 50 MG tablet Take one (1) tablet by mouth ( 50 mg) 2 hours prior to CT scan. 1 tablet 0   oxyCODONE (OXY IR/ROXICODONE) 5 MG immediate release tablet Take 1 tablet (5 mg total) by mouth every 8 (eight) hours as needed for severe pain (pain score 7-10). 90 tablet 0   RABEprazole (ACIPHEX) 20 MG tablet Take 1 tablet (20 mg total) by mouth daily. 90 tablet 1   zolpidem (AMBIEN CR) 6.25 MG CR tablet TAKE 1 TABLET(6.25 MG) BY MOUTH AT BEDTIME AS NEEDED FOR SLEEP 90 tablet 0   gabapentin (NEURONTIN) 100 MG capsule Take 100 mg by mouth daily. (Patient not taking: Reported on 11/26/2023)     No facility-administered medications prior to visit.    ROS Review of Systems  Constitutional:  Negative for appetite change, chills, diaphoresis, fatigue and fever.  HENT: Negative.    Eyes: Negative.   Respiratory:  Positive for shortness of breath. Negative for cough, chest tightness and wheezing.   Endocrine: Negative.   Genitourinary: Negative.  Negative for difficulty urinating.  Musculoskeletal:  Positive for arthralgias and back pain. Negative for joint swelling and myalgias.  Neurological: Negative.  Negative for dizziness and weakness.  Hematological:  Negative for adenopathy. Does not bruise/bleed easily.  Psychiatric/Behavioral:  Positive for sleep disturbance.     Objective:  BP 138/84 (BP Location: Left Arm, Patient Position: Sitting, Cuff Size: Normal)   Pulse 74   Temp 98.1 F (36.7 C) (Oral)   Resp 16   Ht 5\' 4"  (1.626 m)   Wt 130 lb (59 kg)   SpO2 98%   BMI 22.31 kg/m   BP Readings from Last 3 Encounters:  11/26/23 138/84  10/01/23 116/67  09/19/23 112/68    Wt Readings from Last 3  Encounters:  11/26/23 130 lb (59 kg)  09/19/23 128 lb 9.6 oz (58.3 kg)  09/13/23 128 lb 6.4 oz (58.2 kg)    Physical Exam Cardiovascular:     Heart sounds: Murmur heard.     Systolic murmur is present with a grade of 1/6.     No diastolic murmur is present.     No gallop.     Comments: 1/6 SEM LLSB Musculoskeletal:     Right lower leg: No edema.     Left lower leg: No edema.     Lab Results  Component Value Date   WBC 4.4 09/12/2023   HGB 11.7 (L) 09/12/2023   HCT 35.8 (L) 09/12/2023   PLT 211.0 09/12/2023   GLUCOSE 89 09/19/2023   CHOL 190 07/23/2023   TRIG 98.0 07/23/2023   HDL 49.70 07/23/2023   LDLCALC 121 (H) 07/23/2023   ALT 15 07/23/2023   AST 19 07/23/2023   NA 142 09/19/2023   K 4.5 09/19/2023   CL 104 09/19/2023   CREATININE 0.94 09/19/2023   BUN 18 09/19/2023   CO2 21 09/19/2023   TSH 2.74 04/19/2023   INR 1.0 08/11/2019   HGBA1C 5.9 06/06/2021    CT CORONARY MORPH W/CTA COR W/SCORE W/CA W/CM &/OR WO/CM  Addendum Date: 10/23/2023   ADDENDUM REPORT: 10/23/2023 10:01 EXAM: OVER-READ INTERPRETATION  CT CHEST The following report is an over-read performed by radiologist Dr. Nadara Eaton [facility] on 10/23/2023. This over-read does not include interpretation of cardiac or coronary anatomy or pathology. The coronary CTA interpretation by the cardiologist is attached. COMPARISON:  CT 09/22/2016 FINDINGS: Bilateral breast implants. Images of the upper abdomen are unremarkable. Visualized mediastinal structures are normal. Again noted are reticular densities in the anterior subpleural region of the right lung and suspect this represents post treatment changes. Peripheral nodule in the posterior left lung base measures 7 x 5 mm on image 55/11 and this nodule is minimally changed since 2017. In addition, there is some volume loss and atelectasis at the left lung base. No acute bone abnormality. IMPRESSION: 1. No acute extracardiac findings. 2. Probable post  treatment changes in the right lung. 3. 7 mm pulmonary nodule at the left lung base is minimally changed since 2017 and likely benign. Electronically Signed   By: Richarda Overlie M.D.   On: 10/23/2023 10:01   Result Date: 10/23/2023 CLINICAL DATA:  This is a 72 year old female with anginal symptoms. EXAM: Cardiac/Coronary CTA TECHNIQUE: The patient was scanned on a [employer]. FINDINGS: A 100 kV prospective scan was triggered in the descending thoracic aorta at 111 HU's. Axial non-contrast 3 mm slices were carried out through the heart. The data set was analyzed on a dedicated work station and scored using the Agatson method. Gantry rotation speed was 250 msecs and collimation was .6 mm. No beta blockade and 0.8 mg of sl NTG was given. The  3D data set was reconstructed in 5% intervals of the 67-82 % of the R-R cycle. Diastolic phases were analyzed on a dedicated work station using MPR, MIP and VRT modes. The patient received 80 cc of contrast. Image Quality: Fair with misregistration artifact. Aorta: Normal size. Mild aortic root calcifications. No dissection. Aortic Valve:  Trileaflet.  No calcifications. Coronary Arteries:  Normal coronary origin.  Right dominance. RCA is a large dominant artery that gives rise to PDA and PLA. The proximal RCA with no plaques. The proximal to mid RCA with mild (25-49%) calcified plaque. There a dense focal moderate (50-69%) calcified plaque in the mid RCA. Distal RCA with no plaques. Left main is a large artery that gives rise to LAD and LCX arteries. LAD is a large vessel. There is minimal mixed plaque in the mid LAD. The proximal and distal LAD with no plaques. LCX is a non-dominant artery that gives rise to one large OM1 branch. There is minimal (<24%) soft plaque in the mid LCX. The mid to distal LCX with 2 seperate focal minimal calcified plaques. Coronary Calcium Score: Left main: 0 Left anterior descending artery: 0.916 Left circumflex artery: 100 Right coronary  artery: 336 Total: 437 Percentile: 83 Other findings: Normal pulmonary vein drainage into the left atrium. Normal left atrial appendage without a thrombus. Normal size of the pulmonary artery. Mild mitral annular calcification IMPRESSION: 1. Coronary calcium score of 437. This was 51 percentile for age and sex matched control. 2. Normal coronary origin with right dominance. 3. CAD-RADS 3. Moderate stenosis. Consider symptom-guided anti-ischemic pharmacotherapy as well as risk factor modification per guideline directed care. Additional analysis with CT FFR will be submitted. The noncardiac portion of this study will be interpreted in separate report by the radiologist. Electronically Signed: By: Thomasene Ripple D.O. On: 10/01/2023 11:31   CT CORONARY FRACTIONAL FLOW RESERVE FLUID ANALYSIS  Result Date: 10/01/2023 EXAM: CT FFR ANALYSIS CLINICAL DATA:  CAD FINDINGS: FFRct analysis was performed on the original cardiac CT angiogram dataset. Diagrammatic representation of the FFRct analysis is provided in a separate PDF document in PACS. This dictation was created using the PDF document and an interactive 3D model of the results. 3D model is not available in the EMR/PACS. Normal FFR range is >0.80. Indeterminate (grey) zone is 0.76-0.80. 1. Left Main: FFR = 1.00 2. LAD: Proximal FFR = 0.97, mid FFR = 0.95, distal FFR = 0.91 3. LCX: Proximal FFR = 0.99, distal FFR = 0.95 4. RCA: Proximal FFR = 0.98, mid FFR =0.89, distal FFR = 0.89 IMPRESSION: 1.  CT FFR analysis showed no significant stenosis. RECOMMENDATIONS: Guideline-directed medical therapy and aggressive risk factor modification for secondary prevention of coronary artery disease. Electronically Signed   By: Thomasene Ripple D.O.   On: 10/01/2023 18:50    Assessment & Plan:  There are no diagnoses linked to this encounter.   Follow-up: No follow-ups on file.  Sanda Linger, MD

## 2023-11-29 DIAGNOSIS — I2510 Atherosclerotic heart disease of native coronary artery without angina pectoris: Secondary | ICD-10-CM | POA: Diagnosis not present

## 2023-11-29 DIAGNOSIS — R931 Abnormal findings on diagnostic imaging of heart and coronary circulation: Secondary | ICD-10-CM | POA: Diagnosis not present

## 2023-11-29 DIAGNOSIS — E785 Hyperlipidemia, unspecified: Secondary | ICD-10-CM | POA: Diagnosis not present

## 2023-11-29 DIAGNOSIS — I6300 Cerebral infarction due to thrombosis of unspecified precerebral artery: Secondary | ICD-10-CM | POA: Diagnosis not present

## 2023-11-29 DIAGNOSIS — E7841 Elevated Lipoprotein(a): Secondary | ICD-10-CM | POA: Diagnosis not present

## 2023-11-29 LAB — COMPREHENSIVE METABOLIC PANEL
ALT: 24 [IU]/L (ref 0–32)
AST: 25 [IU]/L (ref 0–40)
Albumin: 4.1 g/dL (ref 3.8–4.8)
Alkaline Phosphatase: 123 [IU]/L — ABNORMAL HIGH (ref 44–121)
BUN/Creatinine Ratio: 14 (ref 12–28)
BUN: 12 mg/dL (ref 8–27)
Bilirubin Total: 0.5 mg/dL (ref 0.0–1.2)
CO2: 21 mmol/L (ref 20–29)
Calcium: 8.9 mg/dL (ref 8.7–10.3)
Chloride: 106 mmol/L (ref 96–106)
Creatinine, Ser: 0.86 mg/dL (ref 0.57–1.00)
Globulin, Total: 2.5 g/dL (ref 1.5–4.5)
Glucose: 93 mg/dL (ref 70–99)
Potassium: 4.5 mmol/L (ref 3.5–5.2)
Sodium: 143 mmol/L (ref 134–144)
Total Protein: 6.6 g/dL (ref 6.0–8.5)
eGFR: 71 mL/min/{1.73_m2} (ref >59)

## 2023-11-30 ENCOUNTER — Ambulatory Visit (INDEPENDENT_AMBULATORY_CARE_PROVIDER_SITE_OTHER): Payer: Medicare Other

## 2023-11-30 ENCOUNTER — Other Ambulatory Visit: Payer: Self-pay | Admitting: Internal Medicine

## 2023-11-30 DIAGNOSIS — M19011 Primary osteoarthritis, right shoulder: Secondary | ICD-10-CM

## 2023-11-30 DIAGNOSIS — I639 Cerebral infarction, unspecified: Secondary | ICD-10-CM | POA: Diagnosis not present

## 2023-11-30 LAB — NMR, LIPOPROFILE
Cholesterol, Total: 105 mg/dL (ref 100–199)
HDL Particle Number: 31.8 umol/L (ref >=30.5)
HDL-C: 52 mg/dL (ref >39)
LDL Particle Number: 341 nmol/L (ref <1000)
LDL-C (NIH Calc): 39 mg/dL (ref 0–99)
LP-IR Score: 36 (ref <=45)
Small LDL Particle Number: <90 nmol/L (ref <=527)
Triglycerides: 61 mg/dL (ref 0–149)

## 2023-11-30 LAB — CUP PACEART REMOTE DEVICE CHECK
Date Time Interrogation Session: 20241205230215
Implantable Pulse Generator Implant Date: 20200818

## 2023-12-03 NOTE — Progress Notes (Signed)
Carelink Summary Report / Loop Recorder 

## 2023-12-04 NOTE — Progress Notes (Unsigned)
Cardiology Office Note:  .   Date:  12/05/2023 ID:  Connie Meyer, DOB April 11, 1951, MRN [id number] PCP: Connie Grandchild, MD [facility] Providers Cardiologist:  None   Patient Profile: .      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 CT calcium score completed 08/09/2023 revealed CAC 485 Agatston score which is 89th percentile for age/sex matched controls.   She established with EP cardiology following cryptogenic stroke June 2020.  During that time she had loop recorder implanted, strangely the loop apparently migrated out and was lost.  Unfortunately a second cryptogenic stroke occurred 08/12/2019 and another loop was implanted though inferiorly noting that the skin superior to breast was quite thin/fragile secondary to radiation therapy.  Ultimately, neurology decided to stop her enrollment in [place] study (apixaban 2.5 mg versus placebo) and started her on Eliquis. She has had no atrial fibrillation today on ILR interrogation since 09/2020.  Per neurology she is on Eliquis 5 mg twice daily post CVA. There has been no evidence of atrial fibrillation per chart review.  She has maintained consistent follow-up.  At office visit 09/19/23, she is tearful when talking about chronic left side pain for at least 4 years. She had a block procedure yesterday at pain clinic in [facility] which has not given her any relief. Years of testing and no one can find the source of her pain. She lives alone. Prior to 2020, she walked 4 miles daily. No residual effects from strokes. Has stairs in her home which she is up and down  regularly. Has some shortness of breath and chest discomfort with stair climbing and also at times when doing yard work. Was started on atorvastatin recently.  She denies palpitations, dyspnea, orthopnea, PND, presyncope, syncope. Family history significant for heart attack in her father and hypertension in both parents. Her ASCVD risk score was 27.1% using PREVENT calculator.  Constant pain in her left side limits her activities. She does some house and yard work. She has been eating more whole grains, vegetables, and fruits recently. Sometimes uses meal services such as [employer].     Coronary CTA was ordered for evaluation of ischemia on 10/01/23 revealed CAC score of 437 (83rd percentile), moderate stenosis in RCA with FFR that did not reveal flow-limiting stenosis. She was started on Repatha due to significantly elevated LP(a) of 341. Repeat lipid panel completed 11/29/23 revealed LDL particle number 341, LDL-C 39, HDL 52, triglycerides 61, total cholesterol 105, and small particle number < 90.     History of Present Illness: Connie Meyer   [name] is a very pleasant 72 y.o. female  who is here today for follow-up of CAD. She reports feeling well and has not had any issues with Repatha since starting it. She maintains a healthy diet and has been cooking more for herself recently, which has resulted in some weight gain. PCP recently detected a murmur and ordered an echocardiogram; she is awaiting test date. She unfortunately continues to have chronic pain in her left side. She has not had recent follow-up at [facility]. Admits she needs to resume her strength training at home.  She denies chest pain, shortness of  breath, orthopnea, PND, palpitations, edema, presyncope, syncope.   ROS: See HPI       Studies Reviewed: .           Risk Assessment/Calculations:             Physical Exam:   VS: BP 112/68   Pulse 82   Ht 5\' 4"  (1.626 m)   Wt 134 lb 11.2 oz (61.1 kg)   SpO2 97%   BMI 23.12  kg/m   Wt Readings from Last 3 Encounters:  12/05/23 134 lb 11.2 oz (61.1 kg)  11/26/23 130 lb (59 kg)  09/19/23 128 lb 9.6 oz (58.3 kg)     GEN: Well nourished, well developed in no acute distress NECK: No JVD; No carotid bruits CARDIAC: RRR, soft murmur. No rubs, gallops RESPIRATORY:  Clear to auscultation without rales, wheezing or rhonchi  ABDOMEN: Soft, non-tender, non-distended EXTREMITIES:  No edema; No deformity     ASSESSMENT AND PLAN: .    CAD without angina: CT calcium score completed 08/09/2023 reveals total Agatston score of 485 (89th percentile). Coronary CTA was completed to evaluate for ischemia given that she was having occasional shortness of breath and chest pain with exertion. CTA revealed mild stenosis in LAD and LCx and prox to mid RCA with moderate calcified plaque in mid RCA, not flow limiting by FFR. She continues to not be very active due to chronic pain in her left side. She denies chest pain, dyspnea, or other symptoms concerning for angina. No indication for further ischemic evaluation at this time.  Continue secondary prevention including heart healthy diet, regular physical activity as tolerated, and good BP and cholesterol control. LDL is well below goal at 39. Continue aspirin, atorvastatin, Repatha, amlodipine.  Hypertension: BP is well controlled. Continue amlodipine.   Hyperlipidemia LDL goal < 70: Elevated LP(a).  She was started on Repatha 08/2023 with improvement in LDL from 121 to 39.  Triglycerides are also well-controlled at 61. Continue Repatha, atorvastatin along with heart healthy diet and lifestyle.   Murmur: Soft murmur detected on exam.  Her PCP identified this recently and has ordered an echocardiogram for evaluation of structural heart disease.  We will await results.  CVA: History of stroke. ILR implant 2020.  Most recent remote device check 11/29/2023 with normal device function no atrial fibrillation, tachycardia, bradycardia, or pauses. No  acute concerns today. Management per neurology.      Dispo:6-7 months with Dr. Duke Salvia or me  Signed, Eligha Bridegroom, NP-C

## 2023-12-05 ENCOUNTER — Encounter (HOSPITAL_BASED_OUTPATIENT_CLINIC_OR_DEPARTMENT_OTHER): Payer: Self-pay | Admitting: Nurse Practitioner

## 2023-12-05 ENCOUNTER — Ambulatory Visit (HOSPITAL_BASED_OUTPATIENT_CLINIC_OR_DEPARTMENT_OTHER): Payer: Medicare Other | Admitting: Nurse Practitioner

## 2023-12-05 VITALS — BP 112/68 | HR 82 | Ht 64.0 in | Wt 134.7 lb

## 2023-12-05 DIAGNOSIS — R011 Cardiac murmur, unspecified: Secondary | ICD-10-CM | POA: Diagnosis not present

## 2023-12-05 DIAGNOSIS — I10 Essential (primary) hypertension: Secondary | ICD-10-CM | POA: Diagnosis not present

## 2023-12-05 DIAGNOSIS — I2510 Atherosclerotic heart disease of native coronary artery without angina pectoris: Secondary | ICD-10-CM

## 2023-12-05 DIAGNOSIS — E785 Hyperlipidemia, unspecified: Secondary | ICD-10-CM | POA: Diagnosis not present

## 2023-12-05 DIAGNOSIS — E7841 Elevated Lipoprotein(a): Secondary | ICD-10-CM | POA: Diagnosis not present

## 2023-12-05 DIAGNOSIS — Z8673 Personal history of transient ischemic attack (TIA), and cerebral infarction without residual deficits: Secondary | ICD-10-CM

## 2023-12-05 NOTE — Patient Instructions (Signed)
Medication Instructions:   Your physician recommends that you continue on your current medications as directed. Please refer to the Current Medication list given to you today.   *If you need a refill on your cardiac medications before your next appointment, please call your pharmacy*   Lab Work:  None ordered.  If you have labs (blood work) drawn today and your tests are completely normal, you will receive your results only by: MyChart Message (if you have MyChart) OR A paper copy in the mail If you have any lab test that is abnormal or we need to change your treatment, we will call you to review the results.   Testing/Procedures:  None ordered.   Follow-Up: At [facility], you and your health needs are our priority.  As part of our continuing mission to provide you with exceptional heart care, we have created designated Provider Care Teams.  These Care Teams include your primary Cardiologist (physician) and Advanced Practice Providers (APPs -  Physician Assistants and Nurse Practitioners) who all work together to provide you with the care you need, when you need it.  We recommend signing up for the patient portal called "MyChart".  Sign up information is provided on this After Visit Summary.  MyChart is used to connect with patients for Virtual Visits (Telemedicine).  Patients are able to view lab/test results, encounter notes, upcoming appointments, etc.  Non-urgent messages can be sent to your provider as well.   To learn more about what you can do with MyChart, go to [email or web address].    Your next appointment:   6 month(s)  Provider:   Chilton Si, MD or Eligha Bridegroom, NP    Other Instructions  Your physician wants you to follow-up in: 6 months.  You will receive a reminder letter in the mail two months in advance. If you don't receive a letter, please call our office to schedule the follow-up appointment.

## 2023-12-06 ENCOUNTER — Ambulatory Visit: Payer: Medicare Other

## 2023-12-06 DIAGNOSIS — M810 Age-related osteoporosis without current pathological fracture: Secondary | ICD-10-CM

## 2023-12-06 MED ORDER — ROMOSOZUMAB-AQQG 105 MG/1.17ML ~~LOC~~ SOSY: 210.0000 mg | PREFILLED_SYRINGE | Freq: Once | SUBCUTANEOUS | Status: DC

## 2023-12-06 MED ORDER — ROMOSOZUMAB-AQQG 105 MG/1.17ML ~~LOC~~ SOSY
210.0000 mg | PREFILLED_SYRINGE | Freq: Once | SUBCUTANEOUS | Status: AC
  Administered 2023-12-06: 210 mg via SUBCUTANEOUS

## 2023-12-06 NOTE — Addendum Note (Signed)
Addended byParticia Nearing on: 12/06/2023 09:24 AM   Modules accepted: Orders

## 2023-12-06 NOTE — Progress Notes (Signed)
PT visits today for their evenity injection. PT informed of what they were receiving and tolerated the injection well. PT advised to reach out to office if needed.

## 2023-12-07 ENCOUNTER — Telehealth: Payer: Self-pay | Admitting: Internal Medicine

## 2023-12-07 ENCOUNTER — Other Ambulatory Visit: Payer: Self-pay | Admitting: Internal Medicine

## 2023-12-07 DIAGNOSIS — M24152 Other articular cartilage disorders, left hip: Secondary | ICD-10-CM

## 2023-12-07 MED ORDER — OXYCODONE HCL 5 MG PO TABS: 5.0000 mg | ORAL_TABLET | Freq: Three times a day (TID) | ORAL | 0 refills | Status: DC | PRN

## 2023-12-07 NOTE — Telephone Encounter (Signed)
Dr Yetta Barre I can't seem to find the denial of the medication. Could you just tell me the denial reason so I can call her back ?

## 2023-12-07 NOTE — Telephone Encounter (Signed)
Unable to reach patient. Left a detailed message in regards to her medication not being denied and being sent to her pharmacy.

## 2023-12-07 NOTE — Telephone Encounter (Signed)
Pt called wanting to know why her medication was denied please call pt back for reasoning. Thanks    oxyCODONE (OXY IR/ROXICODONE) 5 MG immediate release tablet

## 2023-12-21 DIAGNOSIS — R1012 Left upper quadrant pain: Secondary | ICD-10-CM | POA: Diagnosis not present

## 2023-12-21 DIAGNOSIS — M48061 Spinal stenosis, lumbar region without neurogenic claudication: Secondary | ICD-10-CM | POA: Diagnosis not present

## 2023-12-21 DIAGNOSIS — R1084 Generalized abdominal pain: Secondary | ICD-10-CM | POA: Diagnosis not present

## 2023-12-21 DIAGNOSIS — M419 Scoliosis, unspecified: Secondary | ICD-10-CM | POA: Diagnosis not present

## 2023-12-27 ENCOUNTER — Other Ambulatory Visit: Payer: Self-pay | Admitting: Internal Medicine

## 2023-12-27 DIAGNOSIS — I10 Essential (primary) hypertension: Secondary | ICD-10-CM

## 2024-01-01 ENCOUNTER — Other Ambulatory Visit: Payer: Self-pay | Admitting: Internal Medicine

## 2024-01-01 DIAGNOSIS — F5104 Psychophysiologic insomnia: Secondary | ICD-10-CM

## 2024-01-03 ENCOUNTER — Ambulatory Visit (HOSPITAL_COMMUNITY): Payer: Medicare Other

## 2024-01-04 ENCOUNTER — Ambulatory Visit (INDEPENDENT_AMBULATORY_CARE_PROVIDER_SITE_OTHER): Payer: Self-pay

## 2024-01-04 DIAGNOSIS — Z8673 Personal history of transient ischemic attack (TIA), and cerebral infarction without residual deficits: Secondary | ICD-10-CM | POA: Diagnosis not present

## 2024-01-05 LAB — CUP PACEART REMOTE DEVICE CHECK
Date Time Interrogation Session: 20250109230123
Implantable Pulse Generator Implant Date: 20200818

## 2024-01-07 ENCOUNTER — Ambulatory Visit (INDEPENDENT_AMBULATORY_CARE_PROVIDER_SITE_OTHER): Payer: Medicare Other

## 2024-01-07 ENCOUNTER — Telehealth: Payer: Self-pay

## 2024-01-07 DIAGNOSIS — M810 Age-related osteoporosis without current pathological fracture: Secondary | ICD-10-CM

## 2024-01-07 MED ORDER — ROMOSOZUMAB-AQQG 105 MG/1.17ML ~~LOC~~ SOSY
210.0000 mg | PREFILLED_SYRINGE | Freq: Once | SUBCUTANEOUS | Status: AC
Start: 2024-01-07 — End: 2024-01-07
  Administered 2024-01-07: 210 mg via SUBCUTANEOUS

## 2024-01-07 NOTE — Telephone Encounter (Signed)
 Evenity VOB initiated via [email or web address]  Last Evenity inj: 01/07/24 Next Evenity inj DUE: 02/07/24

## 2024-01-07 NOTE — Progress Notes (Signed)
After obtaining consent, and per orders of Dr. Jones, injection of Evenity given by Regla Fitzgibbon P Correy Weidner. Patient instructed to report any adverse reaction to me immediately.  

## 2024-01-08 ENCOUNTER — Other Ambulatory Visit: Payer: Self-pay | Admitting: Internal Medicine

## 2024-01-08 DIAGNOSIS — I6300 Cerebral infarction due to thrombosis of unspecified precerebral artery: Secondary | ICD-10-CM

## 2024-01-08 NOTE — Telephone Encounter (Signed)
 Pt ready for scheduling for EVENITY  on or after : 01/07/24  Out-of-pocket cost due at time of visit: $257 (DEDUCTIBLE)  Number of injection/visits approved: ---  Primary: MEDICARE Prolia  co-insurance: 0% Admin fee co-insurance: 0%  Secondary: AETNA MEDSUP Prolia  co-insurance:  covers the Medicare Part B co-insurance and 100% of the excess charges. This plan does not cover the Medicare Part B deductible Admin fee co-insurance:   Medical Benefit Details: Date Benefits were checked: 01/07/24 Deductible: $0 Met of $257 Required/ Coinsurance: 0%/ Admin Fee: 0%  Prior Auth: N/A PA# Expiration Date:  # of doses approved:  Pharmacy benefit: Copay $--- If patient wants fill through the pharmacy benefit please send prescription to:  --- , and include estimated need by date in rx notes. Pharmacy will ship medication directly to the office.  Patient NOT eligible for Evenity  Copay Card. Copay Card can make patient's cost as little as $25. Link to apply: https://www.amgensupportplus.com/copay  ** This summary of benefits is an estimation of the patient's out-of-pocket cost. Exact cost may very based on individual plan coverage.

## 2024-01-08 NOTE — Telephone Encounter (Signed)
 Connie Meyer

## 2024-01-09 ENCOUNTER — Ambulatory Visit (INDEPENDENT_AMBULATORY_CARE_PROVIDER_SITE_OTHER): Payer: Medicare Other | Admitting: Internal Medicine

## 2024-01-09 ENCOUNTER — Encounter: Payer: Self-pay | Admitting: Internal Medicine

## 2024-01-09 ENCOUNTER — Other Ambulatory Visit: Payer: Self-pay | Admitting: Internal Medicine

## 2024-01-09 VITALS — BP 118/68 | HR 96 | Temp 97.7°F | Ht 64.0 in | Wt 131.0 lb

## 2024-01-09 DIAGNOSIS — I10 Essential (primary) hypertension: Secondary | ICD-10-CM | POA: Diagnosis not present

## 2024-01-09 DIAGNOSIS — M24152 Other articular cartilage disorders, left hip: Secondary | ICD-10-CM

## 2024-01-09 DIAGNOSIS — D539 Nutritional anemia, unspecified: Secondary | ICD-10-CM | POA: Diagnosis not present

## 2024-01-09 DIAGNOSIS — F321 Major depressive disorder, single episode, moderate: Secondary | ICD-10-CM | POA: Diagnosis not present

## 2024-01-09 LAB — IBC + FERRITIN
Ferritin: 168.3 ng/mL (ref 10.0–291.0)
Iron: 127 ug/dL (ref 42–145)
Saturation Ratios: 47 % (ref 20.0–50.0)
TIBC: 270.2 ug/dL (ref 250.0–450.0)
Transferrin: 193 mg/dL — ABNORMAL LOW (ref 212.0–360.0)

## 2024-01-09 LAB — CBC WITH DIFFERENTIAL/PLATELET
Basophils Absolute: 0 10*3/uL (ref 0.0–0.1)
Basophils Relative: 0.3 % (ref 0.0–3.0)
Eosinophils Absolute: 0.3 10*3/uL (ref 0.0–0.7)
Eosinophils Relative: 5.7 % — ABNORMAL HIGH (ref 0.0–5.0)
HCT: 39.5 % (ref 36.0–46.0)
Hemoglobin: 13 g/dL (ref 12.0–15.0)
Lymphocytes Relative: 22.3 % (ref 12.0–46.0)
Lymphs Abs: 1.3 10*3/uL (ref 0.7–4.0)
MCHC: 32.9 g/dL (ref 30.0–36.0)
MCV: 95.8 fL (ref 78.0–100.0)
Monocytes Absolute: 0.4 10*3/uL (ref 0.1–1.0)
Monocytes Relative: 7.6 % (ref 3.0–12.0)
Neutro Abs: 3.7 10*3/uL (ref 1.4–7.7)
Neutrophils Relative %: 64.1 % (ref 43.0–77.0)
Platelets: 222 10*3/uL (ref 150.0–400.0)
RBC: 4.12 Mil/uL (ref 3.87–5.11)
RDW: 13.3 % (ref 11.5–15.5)
WBC: 5.7 10*3/uL (ref 4.0–10.5)

## 2024-01-09 LAB — VITAMIN B12: Vitamin B-12: 428 pg/mL (ref 211–911)

## 2024-01-09 LAB — FOLATE: Folate: 22.7 ng/mL (ref >5.9)

## 2024-01-09 LAB — TSH: TSH: 2.44 u[IU]/mL (ref 0.35–5.50)

## 2024-01-09 MED ORDER — OXYCODONE HCL 5 MG PO TABS: 5.0000 mg | ORAL_TABLET | Freq: Three times a day (TID) | ORAL | 0 refills | Status: DC | PRN

## 2024-01-09 MED ORDER — DULOXETINE HCL 30 MG PO CPEP
30.0000 mg | ORAL_CAPSULE | Freq: Every day | ORAL | 0 refills | Status: DC
Start: 2024-01-09 — End: 2024-02-25

## 2024-01-09 NOTE — Patient Instructions (Signed)

## 2024-01-09 NOTE — Progress Notes (Signed)
 Subjective:  Patient ID: Connie Meyer, female    DOB: [birth year]  Age: 73 y.o. MRN: [id number]  CC: Hypertension, Osteoarthritis, and Anemia   HPI Keira K Clouatre presents for f/up -----  Discussed the use of AI scribe software for clinical note transcription with the patient, who gave verbal consent to proceed.  History of Present Illness   The patient, with a history of chronic pain, recently experienced a significant exacerbation of her symptoms following a long car ride. The pain, which was previously tolerable, became more intense after this event. The patient had sought consultation with a pain specialist in [place], who suggested the insertion of a spinal stimulator, but the patient has decided against this intervention.  The patient also reports feeling tired, which may be related to her recent lab results indicating anemia. She is unsure of the cause of her anemia and denies any symptoms of numbness, tingling, blood loss, or shortness of breath. She has a history of colonoscopy about seven to eight years ago and denies any signs of blood loss. She takes a variety of vitamin supplements.  She denies any difficulty or pain while swallowing. She has lost a few pounds in the last month, which she was unaware of until the consultation.  In addition to her physical symptoms, the patient reports feeling very depressed. She has been having difficulty sleeping, even with the use of Ambien , and she may be experiencing a decrease in appetite. She denies any feelings of hopelessness or thoughts of self-harm. She has been resistant to the idea of taking an antidepressant in the past but is now considering it due to the severity of her depression. She expresses concern about potential weight gain associated with some antidepressants.       Outpatient Medications Prior to Visit  Medication Sig Dispense Refill   amLODipine  (NORVASC ) 10 MG tablet TAKE 1 TABLET(10 MG) BY MOUTH DAILY 90  tablet 1   aspirin  EC 81 MG tablet Take 81 mg by mouth daily. Swallow whole.     atorvastatin  (LIPITOR ) 40 MG tablet Take 1 tablet (40 mg total) by mouth daily at 6 PM. 90 tablet 1   bimatoprost  (LATISSE ) 0.03 % ophthalmic solution daily.     ELIQUIS  5 MG TABS tablet TAKE 1 TABLET(5 MG) BY MOUTH TWICE DAILY 180 tablet 0   Evolocumab  (REPATHA  SURECLICK) 140 MG/ML SOAJ Inject 140 mg into the skin every 14 (fourteen) days. 6 mL 3   gabapentin (NEURONTIN) 100 MG capsule Take 100 mg by mouth daily.     RABEprazole  (ACIPHEX ) 20 MG tablet Take 1 tablet (20 mg total) by mouth daily. 90 tablet 1   zolpidem  (AMBIEN  CR) 6.25 MG CR tablet TAKE 1 TABLET(6.25 MG) BY MOUTH AT BEDTIME AS NEEDED FOR SLEEP 90 tablet 0   oxyCODONE  (OXY IR/ROXICODONE ) 5 MG immediate release tablet Take 1 tablet (5 mg total) by mouth every 8 (eight) hours as needed for severe pain (pain score 7-10). 90 tablet 0   Romosozumab -aqqg (EVENITY ) 105 MG/1. injection 210 mg      No facility-administered medications prior to visit.    ROS Review of Systems  Constitutional: Negative.  Negative for chills, diaphoresis, fatigue and fever.  HENT: Negative.  Negative for trouble swallowing.   Eyes: Negative.   Respiratory: Negative.  Negative for cough, chest tightness, shortness of breath and wheezing.   Cardiovascular:  Negative for chest pain, palpitations and leg swelling.  Gastrointestinal: Negative.  Negative for abdominal pain, constipation, diarrhea, nausea  and vomiting.  Endocrine: Negative.   Genitourinary: Negative.  Negative for difficulty urinating.  Musculoskeletal:  Positive for arthralgias. Negative for back pain and myalgias.  Skin: Negative.   Neurological: Negative.  Negative for dizziness and weakness.  Hematological:  Negative for adenopathy. Does not bruise/bleed easily.  Psychiatric/Behavioral:  Positive for dysphoric mood and sleep disturbance. Negative for behavioral problems, confusion, decreased  concentration, self-injury and suicidal ideas. The patient is nervous/anxious. The patient is not hyperactive.     Objective:  BP 118/68 (BP Location: Left Arm, Patient Position: Sitting, Cuff Size: Small)   Pulse 96   Temp 97.7 F (36.5 C) (Oral)   Ht 5\' 4"  (1.626 m)   Wt 131 lb (59.4 kg)   SpO2 97%   BMI 22.49 kg/m   BP Readings from Last 3 Encounters:  01/09/24 118/68  12/05/23 112/68  11/26/23 138/84    Wt Readings from Last 3 Encounters:  01/09/24 131 lb (59.4 kg)  12/05/23 134 lb 11.2 oz (61.1 kg)  11/26/23 130 lb (59 kg)    Physical Exam Vitals reviewed.  Constitutional:      Appearance: Normal appearance.  HENT:     Nose: Nose normal.     Mouth/Throat:     Mouth: Mucous membranes are moist.  Eyes:     General: No scleral icterus.    Conjunctiva/sclera: Conjunctivae normal.  Cardiovascular:     Rate and Rhythm: Normal rate and regular rhythm.     Heart sounds: No murmur heard.    No friction rub. No gallop.  Pulmonary:     Effort: Pulmonary effort is normal.     Breath sounds: No stridor. No wheezing, rhonchi or rales.  Abdominal:     General: Abdomen is flat.     Palpations: There is no mass.     Tenderness: There is no abdominal tenderness. There is no guarding.     Hernia: No hernia is present.  Musculoskeletal:        General: Normal range of motion.     Cervical back: Neck supple.     Right lower leg: No edema.     Left lower leg: No edema.  Lymphadenopathy:     Cervical: No cervical adenopathy.  Skin:    General: Skin is dry.  Neurological:     General: No focal deficit present.     Mental Status: She is alert. Mental status is at baseline.  Psychiatric:        Mood and Affect: Mood normal.        Behavior: Behavior normal.     Lab Results  Component Value Date   WBC 5.7 01/09/2024   HGB 13.0 01/09/2024   HCT 39.5 01/09/2024   PLT 222.0 01/09/2024   GLUCOSE 93 11/29/2023   CHOL 190 07/23/2023   TRIG 98.0 07/23/2023   HDL 49.70  07/23/2023   LDLCALC 121 (H) 07/23/2023   ALT 24 11/29/2023   AST 25 11/29/2023   NA 143 11/29/2023   K 4.5 11/29/2023   CL 106 11/29/2023   CREATININE 0.86 11/29/2023   BUN 12 11/29/2023   CO2 21 11/29/2023   TSH 2.44 01/09/2024   INR 1.0 08/11/2019   HGBA1C 5.9 06/06/2021    CT CORONARY MORPH W/CTA COR W/SCORE W/CA W/CM &/OR WO/CM Addendum Date: 10/23/2023 ADDENDUM REPORT: 10/23/2023 10:01 EXAM: OVER-READ INTERPRETATION  CT CHEST The following report is an over-read performed by radiologist Dr. Nicholos Barlow [facility] on 10/23/2023. This over-read does not include  interpretation of cardiac or coronary anatomy or pathology. The coronary CTA interpretation by the cardiologist is attached. COMPARISON:  CT 09/22/2016 FINDINGS: Bilateral breast implants. Images of the upper abdomen are unremarkable. Visualized mediastinal structures are normal. Again noted are reticular densities in the anterior subpleural region of the right lung and suspect this represents post treatment changes. Peripheral nodule in the posterior left lung base measures 7 x 5 mm on image 55/11 and this nodule is minimally changed since 2017. In addition, there is some volume loss and atelectasis at the left lung base. No acute bone abnormality. IMPRESSION: 1. No acute extracardiac findings. 2. Probable post treatment changes in the right lung. 3. 7 mm pulmonary nodule at the left lung base is minimally changed since 2017 and likely benign. Electronically Signed   By: Elene Griffes M.D.   On: 10/23/2023 10:01   Result Date: 10/23/2023 CLINICAL DATA:  This is a 73 year old female with anginal symptoms. EXAM: Cardiac/Coronary CTA TECHNIQUE: The patient was scanned on a [employer]. FINDINGS: A 100 kV prospective scan was triggered in the descending thoracic aorta at 111 HU's. Axial non-contrast 3 mm slices were carried out through the heart. The data set was analyzed on a dedicated work station and scored using  the Agatson method. Gantry rotation speed was 250 msecs and collimation was .6 mm. No beta blockade and 0.8 mg of sl NTG was given. The 3D data set was reconstructed in 5% intervals of the 67-82 % of the R-R cycle. Diastolic phases were analyzed on a dedicated work station using MPR, MIP and VRT modes. The patient received 80 cc of contrast. Image Quality: Fair with misregistration artifact. Aorta: Normal size. Mild aortic root calcifications. No dissection. Aortic Valve:  Trileaflet.  No calcifications. Coronary Arteries:  Normal coronary origin.  Right dominance. RCA is a large dominant artery that gives rise to PDA and PLA. The proximal RCA with no plaques. The proximal to mid RCA with mild (25-49%) calcified plaque. There a dense focal moderate (50-69%) calcified plaque in the mid RCA. Distal RCA with no plaques. Left main is a large artery that gives rise to LAD and LCX arteries. LAD is a large vessel. There is minimal mixed plaque in the mid LAD. The proximal and distal LAD with no plaques. LCX is a non-dominant artery that gives rise to one large OM1 branch. There is minimal (<24%) soft plaque in the mid LCX. The mid to distal LCX with 2 seperate focal minimal calcified plaques. Coronary Calcium  Score: Left main: 0 Left anterior descending artery: 0.916 Left circumflex artery: 100 Right coronary artery: 336 Total: 437 Percentile: 83 Other findings: Normal pulmonary vein drainage into the left atrium. Normal left atrial appendage without a thrombus. Normal size of the pulmonary artery. Mild mitral annular calcification IMPRESSION: 1. Coronary calcium  score of 437. This was 36 percentile for age and sex matched control. 2. Normal coronary origin with right dominance. 3. CAD-RADS 3. Moderate stenosis. Consider symptom-guided anti-ischemic pharmacotherapy as well as risk factor modification per guideline directed care. Additional analysis with CT FFR will be submitted. The noncardiac portion of this study will be  interpreted in separate report by the radiologist. Electronically Signed: By: Kardie  Tobb D.O. On: 10/01/2023 11:31   CT CORONARY FRACTIONAL FLOW RESERVE FLUID ANALYSIS Result Date: 10/01/2023 EXAM: CT FFR ANALYSIS CLINICAL DATA:  CAD FINDINGS: FFRct analysis was performed on the original cardiac CT angiogram dataset. Diagrammatic representation of the FFRct analysis is provided in a  separate PDF document in PACS. This dictation was created using the PDF document and an interactive 3D model of the results. 3D model is not available in the EMR/PACS. Normal FFR range is >0.80. Indeterminate (grey) zone is 0.76-0.80. 1. Left Main: FFR = 1.00 2. LAD: Proximal FFR = 0.97, mid FFR = 0.95, distal FFR = 0.91 3. LCX: Proximal FFR = 0.99, distal FFR = 0.95 4. RCA: Proximal FFR = 0.98, mid FFR =0.89, distal FFR = 0.89 IMPRESSION: 1.  CT FFR analysis showed no significant stenosis. RECOMMENDATIONS: Guideline-directed medical therapy and aggressive risk factor modification for secondary prevention of coronary artery disease. Electronically Signed   By: Kardie  Tobb D.O.   On: 10/01/2023 18:50    Assessment & Plan:   Deficiency anemia- H/H are normal now. -     IBC + Ferritin; Future -     Reticulocytes; Future -     Vitamin B1; Future -     Zinc ; Future -     CBC with Differential/Platelet; Future -     Vitamin B12; Future -     Folate; Future  Primary hypertension- BP is well controlled. -     TSH; Future  Current moderate episode of major depressive disorder without prior episode (HCC) -     DULoxetine  HCl; Take 1 capsule (30 mg total) by mouth daily.  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  Dispense: 90 tablet; Refill: 0     Follow-up: Return in about 3 months (around 04/08/2024).  Sandra Crouch, MD

## 2024-01-09 NOTE — Telephone Encounter (Signed)
 Copied from CRM [id number]. Topic: Clinical - Medication Refill >> Jan 09, 2024 11:52 AM Isabell A wrote: Most Recent Primary Care Visit:  Provider: Arch Beans, GRACE P  Department: [facility]  Visit Type: NURSE VISIT  Date: 01/07/2024  Medication: ***  Has the patient contacted their pharmacy?  (Agent: If no, request that the patient contact the pharmacy for the refill. If patient does not wish to contact the pharmacy document the reason why and proceed with request.) (Agent: If yes, when and what did the pharmacy advise?)  Is this the correct pharmacy for this prescription?  If no, delete pharmacy and type the correct one.  This is the patient's preferred pharmacy:  [facility][id number] Jonette Nestle, [place] - [address] AT [facility] [address] [place] [address] Phone: [phone] Fax: [phone]   Has the prescription been filled recently?   Is the patient out of the medication?   Has the patient been seen for an appointment in the last year OR does the patient have an upcoming appointment?   Can we respond through MyChart?   Agent: Please be advised that Rx refills may take up to 3 business days. We ask that you follow-up with your pharmacy.

## 2024-01-16 LAB — EXTRA SPECIMEN

## 2024-01-16 LAB — RETICULOCYTES
ABS Retic: 33280 {cells}/uL (ref 20000–80000)
Retic Ct Pct: 0.8 %

## 2024-01-16 LAB — ZINC: Zinc: 82 ug/dL (ref 60–130)

## 2024-01-16 LAB — VITAMIN B1: Vitamin B1 (Thiamine): 48 nmol/L — ABNORMAL HIGH (ref 8–30)

## 2024-01-17 ENCOUNTER — Ambulatory Visit (HOSPITAL_COMMUNITY): Payer: Medicare Other

## 2024-02-06 ENCOUNTER — Ambulatory Visit (HOSPITAL_COMMUNITY): Payer: Medicare Other | Attending: Internal Medicine

## 2024-02-06 DIAGNOSIS — R0609 Other forms of dyspnea: Secondary | ICD-10-CM | POA: Diagnosis not present

## 2024-02-06 DIAGNOSIS — R011 Cardiac murmur, unspecified: Secondary | ICD-10-CM | POA: Insufficient documentation

## 2024-02-06 LAB — ECHOCARDIOGRAM COMPLETE
Area-P 1/2: 3.72 cm2
S' Lateral: 3 cm

## 2024-02-07 ENCOUNTER — Encounter: Payer: Self-pay | Admitting: Internal Medicine

## 2024-02-08 ENCOUNTER — Ambulatory Visit (INDEPENDENT_AMBULATORY_CARE_PROVIDER_SITE_OTHER): Payer: Self-pay

## 2024-02-08 DIAGNOSIS — Z8673 Personal history of transient ischemic attack (TIA), and cerebral infarction without residual deficits: Secondary | ICD-10-CM

## 2024-02-09 LAB — CUP PACEART REMOTE DEVICE CHECK
Date Time Interrogation Session: 20250213230012
Implantable Pulse Generator Implant Date: 20200818

## 2024-02-11 ENCOUNTER — Telehealth: Payer: Self-pay | Admitting: Internal Medicine

## 2024-02-11 ENCOUNTER — Other Ambulatory Visit: Payer: Self-pay | Admitting: Internal Medicine

## 2024-02-11 DIAGNOSIS — M24152 Other articular cartilage disorders, left hip: Secondary | ICD-10-CM

## 2024-02-11 NOTE — Telephone Encounter (Signed)
Copied from CRM [id number]. Topic: Clinical - Medication Refill >> Feb 11, 2024 11:22 AM [name] wrote: Most Recent Primary Care Visit:  Provider: Etta Grandchild  Department: [facility]  Visit Type: OFFICE VISIT  Date: 01/09/2024  Medication: oxyCODONE (OXY IR/ROXICODONE) 5 MG immediate release tablet  Has the patient contacted their pharmacy? no (Agent: If no, request that the patient contact the pharmacy for the refill. If patient does not wish to contact the pharmacy document the reason why and proceed with request.) (Agent: If yes, when and what did the pharmacy advise?)   Is this the correct pharmacy for this prescription? Yes If no, delete pharmacy and type the correct one.  This is the patient's preferred pharmacy:  [facility][id number] Ginette Otto, [place] - [address] AT [facility] OF [facility] & [address] [place] [address] Phone: [phone] Fax: [phone]   Has the prescription been filled recently? Yes  Is the patient out of the medication? No  Has the patient been seen for an appointment in the last year OR does the patient have an upcoming appointment? Yes  Can we respond through MyChart? Yes  Agent: Please be advised that Rx refills may take up to 3 business days. We ask that you follow-up with your pharmacy.

## 2024-02-11 NOTE — Telephone Encounter (Signed)
Copied from CRM [id number]. Topic: Clinical - Request for Lab/Test Order >> Feb 11, 2024 11:21 AM [name] wrote: Reason for CRM: Patient is requesting a genetic test for the ATM gene.  ---  Is this something we can order?

## 2024-02-11 NOTE — Telephone Encounter (Signed)
Copied from CRM [id number]. Topic: Appointments - Scheduling Inquiry for Clinic >> Feb 11, 2024 11:20 AM [name] wrote: Reason for CRM: Patient wants to schedule an appointment for a bone density test and Prolia Shot.  ---  Can we please place a bone density order and start a PA for prolia for the pt? TDW

## 2024-02-12 ENCOUNTER — Telehealth: Payer: Self-pay

## 2024-02-12 MED ORDER — OXYCODONE HCL 5 MG PO TABS: 5.0000 mg | ORAL_TABLET | Freq: Three times a day (TID) | ORAL | 0 refills | Status: DC | PRN

## 2024-02-12 NOTE — Telephone Encounter (Signed)
 Prolia VOB initiated via [email or web address]

## 2024-02-12 NOTE — Addendum Note (Signed)
Addended by: Elease Etienne A on: 02/12/2024 12:40 PM   Modules accepted: Orders

## 2024-02-12 NOTE — Progress Notes (Signed)
 Carelink Summary Report / Loop Recorder

## 2024-02-13 ENCOUNTER — Other Ambulatory Visit (HOSPITAL_COMMUNITY): Payer: Self-pay

## 2024-02-13 NOTE — Telephone Encounter (Signed)
 [name]

## 2024-02-13 NOTE — Telephone Encounter (Signed)
Pt ready for scheduling for Prolia on or after : 02/13/24  Out-of-pocket cost due at time of visit: $0 (medical buy and bill)  Number of injection/visits approved: --  Primary: [place] Medicare - Medicare Prolia co-insurance: 20% Admin fee co-insurance: 0%  Secondary: Aetna - Medsup Prolia co-insurance: 100% Admin fee co-insurance: 0%  Medical Benefit Details: Date Benefits were checked: 02/12/24 Deductible: $257 ($257 met)/ Coinsurance: 100%/ Admin Fee: 100%  Prior Auth: N/A PA# Expiration Date:   # of doses approved:  Pharmacy benefit: Copay $590.44 If patient wants fill through the pharmacy benefit please send prescription to:  [facility] , and include estimated need by date in rx notes. Pharmacy will ship medication directly to the office.  Patient not eligible for Prolia Copay Card. Copay Card can make patient's cost as little as $25. Link to apply: https://www.amgensupportplus.com/copay  ** This summary of benefits is an estimation of the patient's out-of-pocket cost. Exact cost may very based on individual plan coverage.

## 2024-02-13 NOTE — Telephone Encounter (Signed)
A prior authorization is not required for the patient to receive the Prolia injection.

## 2024-02-18 ENCOUNTER — Other Ambulatory Visit: Payer: Self-pay | Admitting: Internal Medicine

## 2024-02-18 DIAGNOSIS — I5032 Chronic diastolic (congestive) heart failure: Secondary | ICD-10-CM

## 2024-02-18 DIAGNOSIS — F321 Major depressive disorder, single episode, moderate: Secondary | ICD-10-CM

## 2024-02-18 MED ORDER — EMPAGLIFLOZIN 10 MG PO TABS
10.0000 mg | ORAL_TABLET | Freq: Every day | ORAL | 0 refills | Status: AC
Start: 2024-02-18 — End: ?

## 2024-02-18 NOTE — Telephone Encounter (Signed)
 Unable to reach patient. LMTRC

## 2024-02-18 NOTE — Telephone Encounter (Signed)
**Note De-identified Hilliary Jock Obfuscation** Please advise 

## 2024-02-18 NOTE — Telephone Encounter (Signed)
 Patient is cleared for PROLIA injection; PA information on 02/13/24.  Mentioned in provider note last on Patient finished 1 year of Evenity injections ready to start Prolia; LAST OV 01/09/2024 Medication is CLINIC supplied. CoPay:$0

## 2024-02-22 NOTE — Telephone Encounter (Signed)
 Unable to reach patient. LMTRC

## 2024-02-25 ENCOUNTER — Other Ambulatory Visit: Payer: Self-pay | Admitting: Internal Medicine

## 2024-02-25 DIAGNOSIS — F321 Major depressive disorder, single episode, moderate: Secondary | ICD-10-CM

## 2024-02-25 MED ORDER — DULOXETINE HCL 60 MG PO CPEP
60.0000 mg | ORAL_CAPSULE | Freq: Every day | ORAL | 0 refills | Status: DC
Start: 2024-02-25 — End: 2024-10-28

## 2024-03-07 ENCOUNTER — Other Ambulatory Visit: Payer: Self-pay | Admitting: Internal Medicine

## 2024-03-07 DIAGNOSIS — M24152 Other articular cartilage disorders, left hip: Secondary | ICD-10-CM

## 2024-03-07 NOTE — Telephone Encounter (Signed)
 Copied from CRM [id number]. Topic: Clinical - Medication Refill >> Mar 07, 2024 10:07 AM [name] wrote: Most Recent Primary Care Visit:  Provider: Etta Grandchild  Department: [facility]  Visit Type: OFFICE VISIT  Date: 01/09/2024  Medication: oxyCODONE (OXY IR/ROXICODONE) 5 MG immediate release tablet   Has the patient contacted their pharmacy? Yes (Agent: If no, request that the patient contact the pharmacy for the refill. If patient does not wish to contact the pharmacy document the reason why and proceed with request.) (Agent: If yes, when and what did the pharmacy advise?) no more refills  Is this the correct pharmacy for this prescription? Yes If no, delete pharmacy and type the correct one.  This is the patient's preferred pharmacy:  [facility] #[id number] Ginette Otto, [place] - [address] AT [facility] OF [facility] & [address] [place] [address] Phone: [phone] Fax: [phone]   Has the prescription been filled recently? Yes  Is the patient out of the medication? No  Has the patient been seen for an appointment in the last year OR does the patient have an upcoming appointment? Yes  Can we respond through MyChart? Yes  Agent: Please be advised that Rx refills may take up to 3 business days. We ask that you follow-up with your pharmacy.

## 2024-03-07 NOTE — Telephone Encounter (Signed)
 Copied from CRM [id number]. Topic: Appointments - Scheduling Inquiry for Clinic >> Mar 07, 2024 10:10 AM [name] wrote: Reason for CRM: Patient wants to schedule a bone density injection.  ---  LVM for pt to call back to sch prolia appointment

## 2024-03-08 MED ORDER — OXYCODONE HCL 5 MG PO TABS: 5.0000 mg | ORAL_TABLET | Freq: Three times a day (TID) | ORAL | 0 refills | Status: DC | PRN

## 2024-03-11 NOTE — Telephone Encounter (Signed)
 Unable to reach patient. Left message to return our call and get scheduled for her prolia injection on the nurse schedule.

## 2024-03-11 NOTE — Progress Notes (Signed)
 Carelink Summary Report / Loop Recorder

## 2024-03-11 NOTE — Addendum Note (Signed)
 Addended by: Elease Etienne A on: 03/11/2024 10:09 AM   Modules accepted: Orders

## 2024-03-14 ENCOUNTER — Ambulatory Visit (INDEPENDENT_AMBULATORY_CARE_PROVIDER_SITE_OTHER): Payer: Self-pay

## 2024-03-14 DIAGNOSIS — Z8673 Personal history of transient ischemic attack (TIA), and cerebral infarction without residual deficits: Secondary | ICD-10-CM

## 2024-03-14 LAB — CUP PACEART REMOTE DEVICE CHECK
Date Time Interrogation Session: 20250320230350
Implantable Pulse Generator Implant Date: 20200818

## 2024-03-20 NOTE — Telephone Encounter (Signed)
 Reason for CRM: Patient wants to schedule a bone density injection Patient would like a call back #[phone]  ---  LVM for pt to call back to be scheduled.

## 2024-03-23 ENCOUNTER — Other Ambulatory Visit: Payer: Self-pay | Admitting: Internal Medicine

## 2024-03-23 DIAGNOSIS — F5104 Psychophysiologic insomnia: Secondary | ICD-10-CM

## 2024-04-01 ENCOUNTER — Other Ambulatory Visit: Payer: Self-pay | Admitting: Internal Medicine

## 2024-04-01 DIAGNOSIS — M24152 Other articular cartilage disorders, left hip: Secondary | ICD-10-CM

## 2024-04-01 NOTE — Telephone Encounter (Signed)
 Last Fill: 03/08/24 90 tabs/0 RF  Last OV: 01/09/24 Next OV: None Scheduled  Routing to provider for review/authorization.

## 2024-04-01 NOTE — Telephone Encounter (Signed)
 Copied from CRM [id number]. Topic: Clinical - Medication Refill >> Apr 01, 2024  9:16 AM Efraim Kaufmann C wrote: Most Recent Primary Care Visit:  Provider: Etta Grandchild  Department: [facility]  Visit Type: OFFICE VISIT  Date: 01/09/2024  Medication: oxyCODONE (OXY IR/ROXICODONE) 5 MG immediate release tablet  Has the patient contacted their pharmacy? Yes (Agent: If no, request that the patient contact the pharmacy for the refill. If patient does not wish to contact the pharmacy document the reason why and proceed with request.) (Agent: If yes, when and what did the pharmacy advise?)  Is this the correct pharmacy for this prescription? Yes If no, delete pharmacy and type the correct one.  This is the patient's preferred pharmacy:  [facility][id number] Ginette Otto, [place] - [address] AT [facility] [employer] [facility] [address] [place] [address] Phone: [phone] Fax: [phone]   Has the prescription been filled recently? No  Is the patient out of the medication? No  Has the patient been seen for an appointment in the last year OR does the patient have an upcoming appointment? Yes  Can we respond through MyChart? Yes  Agent: Please be advised that Rx refills may take up to 3 business days. We ask that you follow-up with your pharmacy.

## 2024-04-02 MED ORDER — OXYCODONE HCL 5 MG PO TABS
5.0000 mg | ORAL_TABLET | Freq: Three times a day (TID) | ORAL | 0 refills | Status: DC | PRN
Start: 2024-04-02 — End: 2024-04-29

## 2024-04-03 ENCOUNTER — Other Ambulatory Visit: Payer: Self-pay | Admitting: Internal Medicine

## 2024-04-03 DIAGNOSIS — I6300 Cerebral infarction due to thrombosis of unspecified precerebral artery: Secondary | ICD-10-CM

## 2024-04-08 ENCOUNTER — Other Ambulatory Visit: Payer: Self-pay | Admitting: Internal Medicine

## 2024-04-08 DIAGNOSIS — F5104 Psychophysiologic insomnia: Secondary | ICD-10-CM

## 2024-04-18 ENCOUNTER — Ambulatory Visit (INDEPENDENT_AMBULATORY_CARE_PROVIDER_SITE_OTHER): Payer: Self-pay

## 2024-04-18 DIAGNOSIS — Z8673 Personal history of transient ischemic attack (TIA), and cerebral infarction without residual deficits: Secondary | ICD-10-CM

## 2024-04-18 LAB — CUP PACEART REMOTE DEVICE CHECK
Date Time Interrogation Session: 20250424230217
Implantable Pulse Generator Implant Date: 20200818

## 2024-04-22 NOTE — Progress Notes (Signed)
 Carelink Summary Report / Loop Recorder

## 2024-04-29 ENCOUNTER — Other Ambulatory Visit: Payer: Self-pay | Admitting: Internal Medicine

## 2024-04-29 DIAGNOSIS — M24152 Other articular cartilage disorders, left hip: Secondary | ICD-10-CM

## 2024-04-29 NOTE — Telephone Encounter (Signed)
 Copied from CRM [id number]. Topic: Clinical - Medication Refill >> Apr 29, 2024 11:08 AM Marlan Silva wrote: Most Recent Primary Care Visit:  Provider: Arcadio Knuckles  Department: [facility]  Visit Type: OFFICE VISIT  Date: 01/09/2024  Medication: oxyCODONE  (OXY IR/ROXICODONE ) 5 MG immediate release tablet  Has the patient contacted their pharmacy? Yes (Agent: If no, request that the patient contact the pharmacy for the refill. If patient does not wish to contact the pharmacy document the reason why and proceed with request.) (Agent: If yes, when and what did the pharmacy advise?)  Is this the correct pharmacy for this prescription? Yes If no, delete pharmacy and type the correct one.  This is the patient's preferred pharmacy:  [facility] #[id number] Jonette Nestle, [place] - [address] AT [facility] [address] [place] [address] Phone: [phone] Fax: [phone]   Has the prescription been filled recently? Yes  Is the patient out of the medication? No  Has the patient been seen for an appointment in the last year OR does the patient have an upcoming appointment? Yes  Can we respond through MyChart? Yes  Agent: Please be advised that Rx refills may take up to 3 business days. We ask that you follow-up with your pharmacy.

## 2024-05-02 MED ORDER — OXYCODONE HCL 5 MG PO TABS: 5.0000 mg | ORAL_TABLET | Freq: Three times a day (TID) | ORAL | 0 refills | Status: DC | PRN

## 2024-05-02 NOTE — Telephone Encounter (Signed)
 Copied from CRM [id number]. Topic: Clinical - Prescription Issue >> May 02, 2024  9:01 AM Connie Meyer wrote: Reason for CRM: Patient is following up on her oxycodone  medication she stated she reached out the 6th of this month to get it refilled and she still hasn't heard anything

## 2024-05-23 ENCOUNTER — Ambulatory Visit (INDEPENDENT_AMBULATORY_CARE_PROVIDER_SITE_OTHER): Payer: Self-pay

## 2024-05-23 ENCOUNTER — Ambulatory Visit: Payer: Self-pay | Admitting: Cardiology

## 2024-05-23 DIAGNOSIS — Z8673 Personal history of transient ischemic attack (TIA), and cerebral infarction without residual deficits: Secondary | ICD-10-CM

## 2024-05-23 LAB — CUP PACEART REMOTE DEVICE CHECK
Date Time Interrogation Session: 20250529232440
Implantable Pulse Generator Implant Date: 20200818

## 2024-05-26 ENCOUNTER — Telehealth: Payer: Self-pay | Admitting: Internal Medicine

## 2024-05-26 ENCOUNTER — Other Ambulatory Visit: Payer: Self-pay | Admitting: Internal Medicine

## 2024-05-26 DIAGNOSIS — M24152 Other articular cartilage disorders, left hip: Secondary | ICD-10-CM

## 2024-05-26 NOTE — Telephone Encounter (Signed)
 Copied from CRM [id number]. Topic: Clinical - Medication Question >> May 26, 2024  1:44 PM Earnestine Goes B wrote: Reason for CRM: pt called to speak with nurse regarding her bone density shot. Pt can not remember the name of it, states no on has contacted her regarding the shot in a very long time. Please call pt back at [phone]  ---  There a re multiple phone notes regarding pt's 'bone density shot'. Message from 1.13.25 is about evenity , notes from 2.18.25 is about prolia. Unsure of what to schedule pt for. Please advise.

## 2024-05-26 NOTE — Telephone Encounter (Signed)
 Copied from CRM [id number]. Topic: Clinical - Medication Refill >> May 26, 2024  1:42 PM Akiba B wrote: Medication: oxyCODONE  (OXY IR/ROXICODONE ) 5 MG immediate release tablet  Has the patient contacted their pharmacy? Yes (Agent: If no, request that the patient contact the pharmacy for the refill. If patient does not wish to contact the pharmacy document the reason why and proceed with request.) (Agent: If yes, when and what did the pharmacy advise?)  This is the patient's preferred pharmacy:  [facility] #[id number] Jonette Nestle, [place] - [address] AT [facility] OF [address] [place] [address] Phone: [phone] Fax: [phone]  Is this the correct pharmacy for this prescription? Yes If no, delete pharmacy and type the correct one.   Has the prescription been filled recently? Yes  Is the patient out of the medication? Yes  Has the patient been seen for an appointment in the last year OR does the patient have an upcoming appointment? Yes  Can we respond through MyChart? Yes  Agent: Please be advised that Rx refills may take up to 3 business days. We ask that you follow-up with your pharmacy.

## 2024-05-27 NOTE — Progress Notes (Signed)
 Carelink Summary Report / Loop Recorder

## 2024-05-30 ENCOUNTER — Telehealth: Payer: Self-pay | Admitting: Internal Medicine

## 2024-05-30 NOTE — Telephone Encounter (Signed)
 Are you willing to send in enough medication to get her to ger appointment or she has to wait?

## 2024-05-30 NOTE — Telephone Encounter (Unsigned)
 Copied from CRM [id number]. Topic: Clinical - Medication Question >> May 30, 2024  8:31 AM Connie Meyer wrote: Reason for CRM: Pt advise she will be going through withdrawal as PCP advise she would need appt to refill the oxyCODONE  (OXY IR/ROXICODONE ) 5 MG immediate release tablet. Pt would like to be contacted [phone]

## 2024-06-02 ENCOUNTER — Other Ambulatory Visit: Payer: Self-pay | Admitting: Internal Medicine

## 2024-06-02 DIAGNOSIS — M24152 Other articular cartilage disorders, left hip: Secondary | ICD-10-CM

## 2024-06-02 DIAGNOSIS — Z8673 Personal history of transient ischemic attack (TIA), and cerebral infarction without residual deficits: Secondary | ICD-10-CM

## 2024-06-02 DIAGNOSIS — E785 Hyperlipidemia, unspecified: Secondary | ICD-10-CM

## 2024-06-02 MED ORDER — OXYCODONE HCL 5 MG PO TABS: 5.0000 mg | ORAL_TABLET | Freq: Three times a day (TID) | ORAL | 0 refills | Status: DC | PRN

## 2024-06-02 MED ORDER — ATORVASTATIN CALCIUM 40 MG PO TABS: 40.0000 mg | ORAL_TABLET | Freq: Every day | ORAL | 0 refills | Status: AC

## 2024-06-02 NOTE — Telephone Encounter (Signed)
 This was routed to our office, but we do not prescribe this medication. I saw you prescribed it last so I am routing it to you.

## 2024-06-18 ENCOUNTER — Encounter: Payer: Self-pay | Admitting: Internal Medicine

## 2024-06-18 ENCOUNTER — Ambulatory Visit (INDEPENDENT_AMBULATORY_CARE_PROVIDER_SITE_OTHER): Admitting: Internal Medicine

## 2024-06-18 ENCOUNTER — Ambulatory Visit: Payer: Self-pay | Admitting: Internal Medicine

## 2024-06-18 ENCOUNTER — Telehealth: Payer: Self-pay | Admitting: Internal Medicine

## 2024-06-18 VITALS — BP 138/78 | HR 72 | Temp 98.0°F | Resp 16 | Ht 64.0 in | Wt 130.8 lb

## 2024-06-18 DIAGNOSIS — R1310 Dysphagia, unspecified: Secondary | ICD-10-CM | POA: Insufficient documentation

## 2024-06-18 DIAGNOSIS — F1210 Cannabis abuse, uncomplicated: Secondary | ICD-10-CM

## 2024-06-18 DIAGNOSIS — I10 Essential (primary) hypertension: Secondary | ICD-10-CM

## 2024-06-18 DIAGNOSIS — Z79891 Long term (current) use of opiate analgesic: Secondary | ICD-10-CM | POA: Diagnosis not present

## 2024-06-18 DIAGNOSIS — M24152 Other articular cartilage disorders, left hip: Secondary | ICD-10-CM

## 2024-06-18 DIAGNOSIS — Z1211 Encounter for screening for malignant neoplasm of colon: Secondary | ICD-10-CM | POA: Insufficient documentation

## 2024-06-18 DIAGNOSIS — E876 Hypokalemia: Secondary | ICD-10-CM | POA: Diagnosis not present

## 2024-06-18 DIAGNOSIS — R159 Full incontinence of feces: Secondary | ICD-10-CM | POA: Insufficient documentation

## 2024-06-18 DIAGNOSIS — E785 Hyperlipidemia, unspecified: Secondary | ICD-10-CM | POA: Diagnosis not present

## 2024-06-18 LAB — BASIC METABOLIC PANEL WITH GFR
BUN: 14 mg/dL (ref 6–23)
CO2: 30 meq/L (ref 19–32)
Calcium: 9.6 mg/dL (ref 8.4–10.5)
Chloride: 101 meq/L (ref 96–112)
Creatinine, Ser: 0.78 mg/dL (ref 0.40–1.20)
GFR: 75.21 mL/min (ref >60.00)
Glucose, Bld: 105 mg/dL — ABNORMAL HIGH (ref 70–99)
Potassium: 3.4 meq/L — ABNORMAL LOW (ref 3.5–5.1)
Sodium: 139 meq/L (ref 135–145)

## 2024-06-18 LAB — CBC WITH DIFFERENTIAL/PLATELET
Basophils Absolute: 0 10*3/uL (ref 0.0–0.1)
Basophils Relative: 0.5 % (ref 0.0–3.0)
Eosinophils Absolute: 0.2 10*3/uL (ref 0.0–0.7)
Eosinophils Relative: 3.8 % (ref 0.0–5.0)
HCT: 37.9 % (ref 36.0–46.0)
Hemoglobin: 12.6 g/dL (ref 12.0–15.0)
Lymphocytes Relative: 22.7 % (ref 12.0–46.0)
Lymphs Abs: 1 10*3/uL (ref 0.7–4.0)
MCHC: 33.4 g/dL (ref 30.0–36.0)
MCV: 94 fl (ref 78.0–100.0)
Monocytes Absolute: 0.4 10*3/uL (ref 0.1–1.0)
Monocytes Relative: 8.7 % (ref 3.0–12.0)
Neutro Abs: 2.9 10*3/uL (ref 1.4–7.7)
Neutrophils Relative %: 64.3 % (ref 43.0–77.0)
Platelets: 194 10*3/uL (ref 150.0–400.0)
RBC: 4.03 Mil/uL (ref 3.87–5.11)
RDW: 13 % (ref 11.5–15.5)
WBC: 4.5 10*3/uL (ref 4.0–10.5)

## 2024-06-18 LAB — LIPID PANEL
Cholesterol: 114 mg/dL (ref 0–200)
HDL: 61.6 mg/dL (ref >39.00)
LDL Cholesterol: 38 mg/dL (ref 0–99)
NonHDL: 52.34
Total CHOL/HDL Ratio: 2
Triglycerides: 74 mg/dL (ref 0.0–149.0)
VLDL: 14.8 mg/dL (ref 0.0–40.0)

## 2024-06-18 LAB — HEPATIC FUNCTION PANEL
ALT: 17 U/L (ref 0–35)
AST: 23 U/L (ref 0–37)
Albumin: 4.6 g/dL (ref 3.5–5.2)
Alkaline Phosphatase: 88 U/L (ref 39–117)
Bilirubin, Direct: 0.2 mg/dL (ref 0.0–0.3)
Total Bilirubin: 0.8 mg/dL (ref 0.2–1.2)
Total Protein: 7.8 g/dL (ref 6.0–8.3)

## 2024-06-18 LAB — CK: Total CK: 189 U/L — ABNORMAL HIGH (ref 7–177)

## 2024-06-18 MED ORDER — POTASSIUM CHLORIDE ER 10 MEQ PO TBCR: 10.0000 meq | EXTENDED_RELEASE_TABLET | Freq: Two times a day (BID) | ORAL | 0 refills | Status: AC

## 2024-06-18 NOTE — Progress Notes (Signed)
 Subjective:  Patient ID: Norris MARLA Pulling, female    DOB: 08-30-1951  Age: 73 y.o. MRN: [id number]  CC: Hypertension and Hyperlipidemia   HPI Shirline K Bayless presents for f/up -----  Discussed the use of AI scribe software for clinical note transcription with the patient, who gave verbal consent to proceed.  History of Present Illness   Shevette Bess Miamor Ayler is a 73 year old female who presents for a routine follow-up visit.  She experiences pain with activity, which is adequately controlled with oxycodone . She is concerned about feeling dependent on the medication, noticing a need for it if not taken. Despite this, she continues its use as she feels there is no alternative for pain management.  She is planning a trip to [place] on July 6th, but her medication refill is not due until July 9th, causing concern. She is apprehensive about driving to the beach alone, as she has not driven much in the past eleven years, but she feels safe and is not escaping from anything.  No symptoms related to high blood pressure, such as headaches, blurred vision, chest pain, shortness of breath, or swelling in her legs or feet. She feels lonely and is no longer taking Ambien  for sleep, using melatonin instead, which she finds helpful. No constipation from oxycodone  use and her bowel movements are regular.       Outpatient Medications Prior to Visit  Medication Sig Dispense Refill   amLODipine  (NORVASC ) 10 MG tablet TAKE 1 TABLET(10 MG) BY MOUTH DAILY 90 tablet 1   aspirin  EC 81 MG tablet Take 81 mg by mouth daily. Swallow whole.     atorvastatin  (LIPITOR ) 40 MG tablet Take 1 tablet (40 mg total) by mouth daily at 6 PM. 90 tablet 0   bimatoprost  (LATISSE ) 0.03 % ophthalmic solution daily.     ELIQUIS  5 MG TABS tablet TAKE 1 TABLET(5 MG) BY MOUTH TWICE DAILY 180 tablet 0   Evolocumab  (REPATHA  SURECLICK) 140 MG/ML SOAJ Inject 140 mg into the skin every 14 (fourteen) days. 6 mL 3   oxyCODONE   (OXY IR/ROXICODONE ) 5 MG immediate release tablet Take 1 tablet (5 mg total) by mouth every 8 (eight) hours as needed for severe pain (pain score 7-10). 90 tablet 0   zolpidem  (AMBIEN  CR) 6.25 MG CR tablet TAKE 1 TABLET(6.25 MG) BY MOUTH AT BEDTIME AS NEEDED FOR SLEEP 90 tablet 0   DULoxetine  (CYMBALTA ) 60 MG capsule Take 1 capsule (60 mg total) by mouth daily. (Patient not taking: Reported on 06/18/2024) 90 capsule 0   empagliflozin  (JARDIANCE ) 10 MG TABS tablet Take 1 tablet (10 mg total) by mouth daily before breakfast. (Patient not taking: Reported on 06/18/2024) 90 tablet 0   gabapentin (NEURONTIN) 100 MG capsule Take 100 mg by mouth daily. (Patient not taking: Reported on 06/18/2024)     RABEprazole  (ACIPHEX ) 20 MG tablet Take 1 tablet (20 mg total) by mouth daily. (Patient not taking: Reported on 06/18/2024) 90 tablet 1   No facility-administered medications prior to visit.    ROS Review of Systems  Constitutional:  Negative for appetite change, chills, diaphoresis, fatigue and fever.  HENT: Negative.    Eyes: Negative.   Respiratory: Negative.  Negative for cough, chest tightness, shortness of breath and wheezing.   Cardiovascular:  Negative for chest pain, palpitations and leg swelling.  Gastrointestinal:  Negative for abdominal pain, constipation, diarrhea, nausea and vomiting.  Endocrine: Negative.   Genitourinary: Negative.  Negative for difficulty urinating.  Musculoskeletal:  Positive for arthralgias. Negative for back pain, myalgias and neck pain.  Skin: Negative.   Neurological:  Negative for dizziness and weakness.  Hematological:  Negative for adenopathy. Does not bruise/bleed easily.  Psychiatric/Behavioral:  Positive for dysphoric mood and sleep disturbance. Negative for behavioral problems, confusion, decreased concentration and suicidal ideas. The patient is nervous/anxious.     Objective:  BP 138/78 (BP Location: Left Arm, Patient Position: Sitting, Cuff Size: Normal)    Pulse 72   Temp 98 F (36.7 C) (Oral)   Resp 16   Ht 5' 4 (1.626 m)   Wt 130 lb 12.8 oz (59.3 kg)   SpO2 98%   BMI 22.45 kg/m   BP Readings from Last 3 Encounters:  06/18/24 138/78  01/09/24 118/68  12/05/23 112/68    Wt Readings from Last 3 Encounters:  06/18/24 130 lb 12.8 oz (59.3 kg)  01/09/24 131 lb (59.4 kg)  12/05/23 134 lb 11.2 oz (61.1 kg)    Physical Exam Vitals reviewed.  Constitutional:      Appearance: Normal appearance.  HENT:     Mouth/Throat:     Mouth: Mucous membranes are moist.   Eyes:     General: No scleral icterus.    Conjunctiva/sclera: Conjunctivae normal.    Cardiovascular:     Rate and Rhythm: Normal rate and regular rhythm.     Heart sounds: Normal heart sounds, S1 normal and S2 normal.     Comments: EKG--- NSR, 69 bpm No LVH, Q waves, or ST/T wave changes  Pulmonary:     Effort: Pulmonary effort is normal.     Breath sounds: No stridor. No wheezing, rhonchi or rales.  Abdominal:     General: Abdomen is flat.     Palpations: There is no mass.     Tenderness: There is no abdominal tenderness. There is no guarding.     Hernia: No hernia is present.   Musculoskeletal:     Right lower leg: No edema.     Left lower leg: No edema.  Lymphadenopathy:     Cervical: No cervical adenopathy.   Skin:    General: Skin is warm and dry.   Neurological:     General: No focal deficit present.     Mental Status: She is alert. Mental status is at baseline.   Psychiatric:        Mood and Affect: Mood normal.        Behavior: Behavior normal.     Lab Results  Component Value Date   WBC 4.5 06/18/2024   HGB 12.6 06/18/2024   HCT 37.9 06/18/2024   PLT 194.0 06/18/2024   GLUCOSE 105 (H) 06/18/2024   CHOL 114 06/18/2024   TRIG 74.0 06/18/2024   HDL 61.60 06/18/2024   LDLCALC 38 06/18/2024   ALT 17 06/18/2024   AST 23 06/18/2024   NA 139 06/18/2024   K 3.4 (L) 06/18/2024   CL 101 06/18/2024   CREATININE 0.78 06/18/2024   BUN 14  06/18/2024   CO2 30 06/18/2024   TSH 2.44 01/09/2024   INR 1.0 08/11/2019   HGBA1C 5.9 06/06/2021    CT CORONARY MORPH W/CTA COR W/SCORE W/CA W/CM &/OR WO/CM Addendum Date: 10/23/2023 ADDENDUM REPORT: 10/23/2023 10:01 EXAM: OVER-READ INTERPRETATION  CT CHEST The following report is an over-read performed by radiologist Dr. Juliene Cramp [facility] on 10/23/2023. This over-read does not include interpretation of cardiac or coronary anatomy or pathology. The coronary CTA interpretation by the cardiologist is attached.  COMPARISON:  CT 09/22/2016 FINDINGS: Bilateral breast implants. Images of the upper abdomen are unremarkable. Visualized mediastinal structures are normal. Again noted are reticular densities in the anterior subpleural region of the right lung and suspect this represents post treatment changes. Peripheral nodule in the posterior left lung base measures 7 x 5 mm on image 55/11 and this nodule is minimally changed since 2017. In addition, there is some volume loss and atelectasis at the left lung base. No acute bone abnormality. IMPRESSION: 1. No acute extracardiac findings. 2. Probable post treatment changes in the right lung. 3. 7 mm pulmonary nodule at the left lung base is minimally changed since 2017 and likely benign. Electronically Signed   By: Juliene Balder M.D.   On: 10/23/2023 10:01   Result Date: 10/23/2023 CLINICAL DATA:  This is a 73 year old female with anginal symptoms. EXAM: Cardiac/Coronary CTA TECHNIQUE: The patient was scanned on a [employer]. FINDINGS: A 100 kV prospective scan was triggered in the descending thoracic aorta at 111 HU's. Axial non-contrast 3 mm slices were carried out through the heart. The data set was analyzed on a dedicated work station and scored using the Agatson method. Gantry rotation speed was 250 msecs and collimation was .6 mm. No beta blockade and 0.8 mg of sl NTG was given. The 3D data set was reconstructed in 5% intervals of  the 67-82 % of the R-R cycle. Diastolic phases were analyzed on a dedicated work station using MPR, MIP and VRT modes. The patient received 80 cc of contrast. Image Quality: Fair with misregistration artifact. Aorta: Normal size. Mild aortic root calcifications. No dissection. Aortic Valve:  Trileaflet.  No calcifications. Coronary Arteries:  Normal coronary origin.  Right dominance. RCA is a large dominant artery that gives rise to PDA and PLA. The proximal RCA with no plaques. The proximal to mid RCA with mild (25-49%) calcified plaque. There a dense focal moderate (50-69%) calcified plaque in the mid RCA. Distal RCA with no plaques. Left main is a large artery that gives rise to LAD and LCX arteries. LAD is a large vessel. There is minimal mixed plaque in the mid LAD. The proximal and distal LAD with no plaques. LCX is a non-dominant artery that gives rise to one large OM1 branch. There is minimal (<24%) soft plaque in the mid LCX. The mid to distal LCX with 2 seperate focal minimal calcified plaques. Coronary Calcium  Score: Left main: 0 Left anterior descending artery: 0.916 Left circumflex artery: 100 Right coronary artery: 336 Total: 437 Percentile: 83 Other findings: Normal pulmonary vein drainage into the left atrium. Normal left atrial appendage without a thrombus. Normal size of the pulmonary artery. Mild mitral annular calcification IMPRESSION: 1. Coronary calcium  score of 437. This was 30 percentile for age and sex matched control. 2. Normal coronary origin with right dominance. 3. CAD-RADS 3. Moderate stenosis. Consider symptom-guided anti-ischemic pharmacotherapy as well as risk factor modification per guideline directed care. Additional analysis with CT FFR will be submitted. The noncardiac portion of this study will be interpreted in separate report by the radiologist. Electronically Signed: By: Kardie  Tobb D.O. On: 10/01/2023 11:31   CT CORONARY FRACTIONAL FLOW RESERVE FLUID ANALYSIS Result  Date: 10/01/2023 EXAM: CT FFR ANALYSIS CLINICAL DATA:  CAD FINDINGS: FFRct analysis was performed on the original cardiac CT angiogram dataset. Diagrammatic representation of the FFRct analysis is provided in a separate PDF document in PACS. This dictation was created using the PDF document and an interactive 3D  model of the results. 3D model is not available in the EMR/PACS. Normal FFR range is >0.80. Indeterminate (grey) zone is 0.76-0.80. 1. Left Main: FFR = 1.00 2. LAD: Proximal FFR = 0.97, mid FFR = 0.95, distal FFR = 0.91 3. LCX: Proximal FFR = 0.99, distal FFR = 0.95 4. RCA: Proximal FFR = 0.98, mid FFR =0.89, distal FFR = 0.89 IMPRESSION: 1.  CT FFR analysis showed no significant stenosis. RECOMMENDATIONS: Guideline-directed medical therapy and aggressive risk factor modification for secondary prevention of coronary artery disease. Electronically Signed   By: Kardie  Tobb D.O.   On: 10/01/2023 18:50    Assessment & Plan:  Chronic hypokalemia -     Potassium Chloride  ER; Take 1 tablet (10 mEq total) by mouth 2 (two) times daily.  Dispense: 180 tablet; Refill: 0  Hyperlipidemia with target LDL less than 100 -     Lipid panel; Future -     CK; Future -     Hepatic function panel; Future  Primary hypertension- BP is well controlled. EKG is negative for LVH. -     EKG 12-Lead -     Basic metabolic panel with GFR; Future -     CBC with Differential/Platelet; Future -     Hepatic function panel; Future -     Potassium Chloride  ER; Take 1 tablet (10 mEq total) by mouth 2 (two) times daily.  Dispense: 180 tablet; Refill: 0  Long-term current use of opiate analgesic- With the exception of THC, the UDS is consistent. 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  Debby Molt, MD

## 2024-06-18 NOTE — Telephone Encounter (Signed)
 Copied from CRM [id number]. Topic: General - Other >> Jun 18, 2024  4:04 PM Connie Meyer wrote: Reason for CRM: Patient is calling to let us  know to just send her medications to pharmacy on file and when it time for her to go out of town she will have them transferred

## 2024-06-18 NOTE — Patient Instructions (Signed)
 Hypertension, Adult High blood pressure (hypertension) is when the force of blood pumping through the arteries is too strong. The arteries are the blood vessels that carry blood from the heart throughout the body. Hypertension forces the heart to work harder to pump blood and may cause arteries to become narrow or stiff. Untreated or uncontrolled hypertension can lead to a heart attack, heart failure, a stroke, kidney disease, and other problems. A blood pressure reading consists of a higher number over a lower number. Ideally, your blood pressure should be below 120/80. The first ("top") number is called the systolic pressure. It is a measure of the pressure in your arteries as your heart beats. The second ("bottom") number is called the diastolic pressure. It is a measure of the pressure in your arteries as the heart relaxes. What are the causes? The exact cause of this condition is not known. There are some conditions that result in high blood pressure. What increases the risk? Certain factors may make you more likely to develop high blood pressure. Some of these risk factors are under your control, including: Smoking. Not getting enough exercise or physical activity. Being overweight. Having too much fat, sugar, calories, or salt (sodium) in your diet. Drinking too much alcohol. Other risk factors include: Having a personal history of heart disease, diabetes, high cholesterol, or kidney disease. Stress. Having a family history of high blood pressure and high cholesterol. Having obstructive sleep apnea. Age. The risk increases with age. What are the signs or symptoms? High blood pressure may not cause symptoms. Very high blood pressure (hypertensive crisis) may cause: Headache. Fast or irregular heartbeats (palpitations). Shortness of breath. Nosebleed. Nausea and vomiting. Vision changes. Severe chest pain, dizziness, and seizures. How is this diagnosed? This condition is diagnosed by  measuring your blood pressure while you are seated, with your arm resting on a flat surface, your legs uncrossed, and your feet flat on the floor. The cuff of the blood pressure monitor will be placed directly against the skin of your upper arm at the level of your heart. Blood pressure should be measured at least twice using the same arm. Certain conditions can cause a difference in blood pressure between your right and left arms. If you have a high blood pressure reading during one visit or you have normal blood pressure with other risk factors, you may be asked to: Return on a different day to have your blood pressure checked again. Monitor your blood pressure at home for 1 week or longer. If you are diagnosed with hypertension, you may have other blood or imaging tests to help your health care provider understand your overall risk for other conditions. How is this treated? This condition is treated by making healthy lifestyle changes, such as eating healthy foods, exercising more, and reducing your alcohol intake. You may be referred for counseling on a healthy diet and physical activity. Your health care provider may prescribe medicine if lifestyle changes are not enough to get your blood pressure under control and if: Your systolic blood pressure is above 130. Your diastolic blood pressure is above 80. Your personal target blood pressure may vary depending on your medical conditions, your age, and other factors. Follow these instructions at home: Eating and drinking  Eat a diet that is high in fiber and potassium, and low in sodium, added sugar, and fat. An example of this eating plan is called the DASH diet. DASH stands for Dietary Approaches to Stop Hypertension. To eat this way: Eat  plenty of fresh fruits and vegetables. Try to fill one half of your plate at each meal with fruits and vegetables. Eat whole grains, such as whole-wheat pasta, brown rice, or whole-grain bread. Fill about one  fourth of your plate with whole grains. Eat or drink low-fat dairy products, such as skim milk or low-fat yogurt. Avoid fatty cuts of meat, processed or cured meats, and poultry with skin. Fill about one fourth of your plate with lean proteins, such as fish, chicken without skin, beans, eggs, or tofu. Avoid pre-made and processed foods. These tend to be higher in sodium, added sugar, and fat. Reduce your daily sodium intake. Many people with hypertension should eat less than 1,500 mg of sodium a day. Do not drink alcohol if: Your health care provider tells you not to drink. You are pregnant, may be pregnant, or are planning to become pregnant. If you drink alcohol: Limit how much you have to: 0-1 drink a day for women. 0-2 drinks a day for men. Know how much alcohol is in your drink. In the [place] one drink equals one 12 oz bottle of beer (355 mL), one 5 oz glass of wine (148 mL), or one 1 oz glass of hard liquor (44 mL). Lifestyle  Work with your health care provider to maintain a healthy body weight or to lose weight. Ask what an ideal weight is for you. Get at least 30 minutes of exercise that causes your heart to beat faster (aerobic exercise) most days of the week. Activities may include walking, swimming, or biking. Include exercise to strengthen your muscles (resistance exercise), such as Pilates or lifting weights, as part of your weekly exercise routine. Try to do these types of exercises for 30 minutes at least 3 days a week. Do not use any products that contain nicotine or tobacco. These products include cigarettes, chewing tobacco, and vaping devices, such as e-cigarettes. If you need help quitting, ask your health care provider. Monitor your blood pressure at home as told by your health care provider. Keep all follow-up visits. This is important. Medicines Take over-the-counter and prescription medicines only as told by your health care provider. Follow directions carefully. Blood  pressure medicines must be taken as prescribed. Do not skip doses of blood pressure medicine. Doing this puts you at risk for problems and can make the medicine less effective. Ask your health care provider about side effects or reactions to medicines that you should watch for. Contact a health care provider if you: Think you are having a reaction to a medicine you are taking. Have headaches that keep coming back (recurring). Feel dizzy. Have swelling in your ankles. Have trouble with your vision. Get help right away if you: Develop a severe headache or confusion. Have unusual weakness or numbness. Feel faint. Have severe pain in your chest or abdomen. Vomit repeatedly. Have trouble breathing. These symptoms may be an emergency. Get help right away. Call 911. Do not wait to see if the symptoms will go away. Do not drive yourself to the hospital. Summary Hypertension is when the force of blood pumping through your arteries is too strong. If this condition is not controlled, it may put you at risk for serious complications. Your personal target blood pressure may vary depending on your medical conditions, your age, and other factors. For most people, a normal blood pressure is less than 120/80. Hypertension is treated with lifestyle changes, medicines, or a combination of both. Lifestyle changes include losing weight, eating a healthy,  low-sodium diet, exercising more, and limiting alcohol. This information is not intended to replace advice given to you by your health care provider. Make sure you discuss any questions you have with your health care provider. Document Revised: 10/18/2021 Document Reviewed: 10/18/2021 Elsevier Patient Education  2024 [employer].

## 2024-06-20 LAB — DM TEMPLATE

## 2024-06-20 LAB — DRUG MONITORING, PANEL 7 WITH CONFIRMATION, URINE
6 Acetylmorphine: NEGATIVE ng/mL (ref <10)
Alcohol Metabolites: NEGATIVE ng/mL (ref <500)
Amphetamines: NEGATIVE ng/mL (ref <500)
Barbiturates: NEGATIVE ng/mL (ref <300)
Benzodiazepines: NEGATIVE ng/mL (ref <100)
Cocaine Metabolite: NEGATIVE ng/mL (ref <150)
Codeine: NEGATIVE ng/mL (ref <50)
Creatinine: 48.5 mg/dL (ref >=20.0)
Hydrocodone: NEGATIVE ng/mL (ref <50)
Hydromorphone: NEGATIVE ng/mL (ref <50)
Marijuana Metabolite: 498 ng/mL — ABNORMAL HIGH (ref <5)
Marijuana Metabolite: POSITIVE ng/mL — AB (ref <20)
Methadone Metabolite: NEGATIVE ng/mL (ref <100)
Morphine: NEGATIVE ng/mL (ref <50)
Norhydrocodone: NEGATIVE ng/mL (ref <50)
Noroxycodone: 1197 ng/mL — ABNORMAL HIGH (ref <50)
Opiates: NEGATIVE ng/mL (ref <100)
Oxidant: NEGATIVE ug/mL (ref <200)
Oxycodone: 2099 ng/mL — ABNORMAL HIGH (ref <50)
Oxycodone: POSITIVE ng/mL — AB (ref <100)
Oxymorphone: 829 ng/mL — ABNORMAL HIGH (ref <50)
pH: 6.7 (ref 4.5–9.0)

## 2024-06-20 NOTE — Telephone Encounter (Signed)
 Unable to reach patient. LMTRC

## 2024-06-21 ENCOUNTER — Encounter: Payer: Self-pay | Admitting: Internal Medicine

## 2024-06-23 ENCOUNTER — Ambulatory Visit: Payer: Self-pay | Admitting: Cardiology

## 2024-06-23 ENCOUNTER — Ambulatory Visit (INDEPENDENT_AMBULATORY_CARE_PROVIDER_SITE_OTHER): Payer: Self-pay

## 2024-06-23 DIAGNOSIS — Z8673 Personal history of transient ischemic attack (TIA), and cerebral infarction without residual deficits: Secondary | ICD-10-CM | POA: Diagnosis not present

## 2024-06-23 LAB — CUP PACEART REMOTE DEVICE CHECK
Date Time Interrogation Session: 20250629230330
Implantable Pulse Generator Implant Date: 20200818

## 2024-06-26 NOTE — Telephone Encounter (Signed)
 Unable to reach patient. LMTRC

## 2024-06-30 ENCOUNTER — Other Ambulatory Visit: Payer: Self-pay

## 2024-06-30 ENCOUNTER — Other Ambulatory Visit: Payer: Self-pay | Admitting: Internal Medicine

## 2024-06-30 DIAGNOSIS — M24152 Other articular cartilage disorders, left hip: Secondary | ICD-10-CM

## 2024-06-30 MED ORDER — OXYCODONE HCL 5 MG PO TABS: 5.0000 mg | ORAL_TABLET | Freq: Three times a day (TID) | ORAL | 0 refills | Status: DC | PRN

## 2024-06-30 MED ORDER — OXYCODONE HCL 5 MG PO TABS
5.0000 mg | ORAL_TABLET | Freq: Three times a day (TID) | ORAL | 0 refills | Status: DC | PRN
Start: 2024-06-30 — End: 2024-07-22

## 2024-06-30 NOTE — Telephone Encounter (Unsigned)
 Copied from CRM [id number]. Topic: Clinical - Prescription Issue >> Jun 30, 2024  4:18 PM Fonda T wrote: Reason for CRM: Patient calling, states medication for  oxyCODONE  (OXY IR/ROXICODONE ) 5 MG immediate release tablet, was sent to the incorrect pharmacy. Patient states she is out of town, and gave correct pharmacy information on a previous message.  Medication should be sent to, [facility] [address], [place] [address]  Preferred pharmacy have been updated in preferred pharmacy list.    Patient can be reached at [phone].

## 2024-06-30 NOTE — Telephone Encounter (Signed)
 Copied from CRM [id number]. Topic: Clinical - Medication Refill >> Jun 30, 2024  2:12 PM Suzen RAMAN wrote: Medication: oxyCODONE  (OXY IR/ROXICODONE ) 5 MG immediate release tablet  Has the patient contacted their pharmacy? Yes  This is the patient's preferred pharmacy:   [facility]  [address], [place] [address]  Is this the correct pharmacy for this prescription? Yes If no, delete pharmacy and type the correct one.   Has the prescription been filled recently? No  Is the patient out of the medication? Yes  Has the patient been seen for an appointment in the last year OR does the patient have an upcoming appointment? Yes  Can we respond through MyChart? Yes  Agent: Please be advised that Rx refills may take up to 3 business days. We ask that you follow-up with your pharmacy.

## 2024-06-30 NOTE — Telephone Encounter (Signed)
 06/02/24 90 tabs/0 RF

## 2024-07-04 NOTE — Telephone Encounter (Signed)
 Medication has been sent. Patient is aware and has her medication.

## 2024-07-05 ENCOUNTER — Other Ambulatory Visit: Payer: Self-pay | Admitting: Internal Medicine

## 2024-07-05 DIAGNOSIS — I6300 Cerebral infarction due to thrombosis of unspecified precerebral artery: Secondary | ICD-10-CM

## 2024-07-05 DIAGNOSIS — I10 Essential (primary) hypertension: Secondary | ICD-10-CM

## 2024-07-09 NOTE — Progress Notes (Signed)
 Carelink Summary Report / Loop Recorder

## 2024-07-22 ENCOUNTER — Other Ambulatory Visit: Payer: Self-pay | Admitting: Internal Medicine

## 2024-07-22 DIAGNOSIS — M24152 Other articular cartilage disorders, left hip: Secondary | ICD-10-CM

## 2024-07-22 NOTE — Telephone Encounter (Signed)
 Copied from CRM [id number]. Topic: Clinical - Medication Refill >> Jul 22, 2024 10:27 AM Chiquita SQUIBB wrote: Medication: oxyCODONE  (OXY IR/ROXICODONE ) 5 MG immediate release tablet [[id number]]  Has the patient contacted their pharmacy? Yes (Agent: If no, request that the patient contact the pharmacy for the refill. If patient does not wish to contact the pharmacy document the reason why and proceed with request.) (Agent: If yes, when and what did the pharmacy advise?)  This is the patient's preferred pharmacy:   [facility] #[id number] [address], [place] - [address] AT [facility] [address] [place] [address] Phone: [phone] Fax: [phone]  Is this the correct pharmacy for this prescription? Yes If no, delete pharmacy and type the correct one.   Has the prescription been filled recently? No  Is the patient out of the medication? No  Has the patient been seen for an appointment in the last year OR does the patient have an upcoming appointment? Yes  Can we respond through MyChart? No  Agent: Please be advised that Rx refills may take up to 3 business days. We ask that you follow-up with your pharmacy.

## 2024-07-24 ENCOUNTER — Ambulatory Visit (INDEPENDENT_AMBULATORY_CARE_PROVIDER_SITE_OTHER): Payer: Self-pay

## 2024-07-24 DIAGNOSIS — Z8673 Personal history of transient ischemic attack (TIA), and cerebral infarction without residual deficits: Secondary | ICD-10-CM

## 2024-07-24 LAB — CUP PACEART REMOTE DEVICE CHECK
Date Time Interrogation Session: 20250730230546
Implantable Pulse Generator Implant Date: 20200818

## 2024-07-25 ENCOUNTER — Ambulatory Visit: Payer: Self-pay | Admitting: Cardiology

## 2024-07-31 MED ORDER — OXYCODONE HCL 5 MG PO TABS: 5.0000 mg | ORAL_TABLET | Freq: Three times a day (TID) | ORAL | 0 refills | Status: DC | PRN

## 2024-08-22 ENCOUNTER — Other Ambulatory Visit: Payer: Self-pay | Admitting: Internal Medicine

## 2024-08-22 DIAGNOSIS — F5104 Psychophysiologic insomnia: Secondary | ICD-10-CM

## 2024-08-22 NOTE — Telephone Encounter (Signed)
 Copied from CRM #[id number]. Topic: Clinical - Medication Refill >> Aug 22, 2024 10:54 AM Harlene ORN wrote: Medication: oxyCODONE  (OXY IR/ROXICODONE ) 5 MG immediate release tablet  Has the patient contacted their pharmacy? No (Agent: If no, request that the patient contact the pharmacy for the refill. If patient does not wish to contact the pharmacy document the reason why and proceed with request.) (Agent: If yes, when and what did the pharmacy advise?)  This is the patient's preferred pharmacy:   [facility] #[id number] [address], [place] - [address] AT [facility] [address] [place] [address] Phone: [phone] Fax: [phone]  Is this the correct pharmacy for this prescription? Yes If no, delete pharmacy and type the correct one.   Has the prescription been filled recently? No  Is the patient out of the medication? No  Has the patient been seen for an appointment in the last year OR does the patient have an upcoming appointment? Yes  Can we respond through MyChart? Yes  Agent: Please be advised that Rx refills may take up to 3 business days. We ask that you follow-up with your pharmacy.

## 2024-08-26 ENCOUNTER — Telehealth: Payer: Self-pay | Admitting: Internal Medicine

## 2024-08-26 ENCOUNTER — Telehealth: Payer: Self-pay

## 2024-08-26 NOTE — Telephone Encounter (Unsigned)
 Copied from CRM [id number]. Topic: Clinical - Medication Refill >> Aug 26, 2024  8:08 AM Berneda FALCON wrote: Medication: zolpidem  (AMBIEN  CR) 6.25 MG CR tablet  Has the patient contacted their pharmacy? No (Agent: If no, request that the patient contact the pharmacy for the refill. If patient does not wish to contact the pharmacy document the reason why and proceed with request.) (Agent: If yes, when and what did the pharmacy advise?)  This is the patient's preferred pharmacy:  [facility] DR[facility] #[id number], [place] - [address] AT [facility] OF [address] [place] [address] Phone: [phone] Fax: [phone]  Is this the correct pharmacy for this prescription? Yes If no, delete pharmacy and type the correct one.   Has the prescription been filled recently? No  Is the patient out of the medication? Yes  Has the patient been seen for an appointment in the last year OR does the patient have an upcoming appointment? Yes  Can we respond through MyChart? No  Agent: Please be advised that Rx refills may take up to 3 business days. We ask that you follow-up with your pharmacy.

## 2024-08-26 NOTE — Telephone Encounter (Signed)
 Copied from CRM [id number]. Topic: Clinical - Prescription Issue >> Aug 26, 2024  8:07 AM Connie Meyer wrote: Reason for CRM: Patient states that her oxyCODONE  (OXY IR/ROXICODONE ) 5 MG immediate release tablet was sent to the wrong pharmacy. She would like this sent to   [facility] DRUG STORE #[id number],  - [address] AT [facility] OF [address] [place] [address] Phone: [phone] Fax: [phone] Hours: Not open 24 hours

## 2024-08-28 ENCOUNTER — Other Ambulatory Visit: Payer: Self-pay

## 2024-08-28 DIAGNOSIS — M24152 Other articular cartilage disorders, left hip: Secondary | ICD-10-CM

## 2024-08-28 MED ORDER — OXYCODONE HCL 5 MG PO TABS: 5.0000 mg | ORAL_TABLET | Freq: Three times a day (TID) | ORAL | 0 refills | Status: DC | PRN

## 2024-08-28 NOTE — Telephone Encounter (Signed)
 Left a very detailed message on Connie Meyer's phone advising her that her medication request has been sent to Dr. Joshua and the provider covering his basket. I also advised her that Dr. Joshua is on vacation until next week and it's a possibility that she will have to wait until then. If patient calls please trey and get a hold of me or send me a new CRM. I'm going to close this encounter at this time.

## 2024-08-28 NOTE — Telephone Encounter (Signed)
 Medication refill MESSAGE has been sent because this medication is not currently on her medication list.

## 2024-08-28 NOTE — Telephone Encounter (Unsigned)
 Copied from CRM [id number]. Topic: Clinical - Medication Question >> Aug 28, 2024 12:24 PM Burnard DEL wrote: Reason for CRM: Patient called in to check on the status of her zolpidem  medication being filled for her.She also would like to know the status of her oxyCODONE  (OXY IR/ROXICODONE ) 5 MG immediate release tablet.  [facility]ORE #[id number], [place] - [address] AT [facility] [address]  Phone: [phone] Fax: [phone]

## 2024-09-09 ENCOUNTER — Ambulatory Visit: Payer: Self-pay | Admitting: *Deleted

## 2024-09-09 ENCOUNTER — Encounter: Payer: Self-pay | Admitting: Internal Medicine

## 2024-09-09 NOTE — Progress Notes (Unsigned)
    Subjective:    Patient ID: Connie Meyer, female    DOB: [birth year], 73 y.o.   MRN: [id number]      HPI Connie Meyer is here for No chief complaint on file.  Back pain  Not sleeping  S/p fall on eliquis  - did not hit head      Medications and allergies reviewed with patient and updated if appropriate.  Current Outpatient Medications on File Prior to Visit  Medication Sig Dispense Refill   amLODipine  (NORVASC ) 10 MG tablet TAKE 1 TABLET(10 MG) BY MOUTH DAILY 90 tablet 1   aspirin  EC 81 MG tablet Take 81 mg by mouth daily. Swallow whole.     atorvastatin  (LIPITOR ) 40 MG tablet Take 1 tablet (40 mg total) by mouth daily at 6 PM. 90 tablet 0   bimatoprost  (LATISSE ) 0.03 % ophthalmic solution daily.     DULoxetine  (CYMBALTA ) 60 MG capsule Take 1 capsule (60 mg total) by mouth daily. (Patient not taking: Reported on 06/18/2024) 90 capsule 0   ELIQUIS  5 MG TABS tablet TAKE 1 TABLET(5 MG) BY MOUTH TWICE DAILY 180 tablet 0   empagliflozin  (JARDIANCE ) 10 MG TABS tablet Take 1 tablet (10 mg total) by mouth daily before breakfast. (Patient not taking: Reported on 06/18/2024) 90 tablet 0   Evolocumab  (REPATHA  SURECLICK) 140 MG/ML SOAJ Inject 140 mg into the skin every 14 (fourteen) days. 6 mL 3   gabapentin (NEURONTIN) 100 MG capsule Take 100 mg by mouth daily. (Patient not taking: Reported on 06/18/2024)     oxyCODONE  (OXY IR/ROXICODONE ) 5 MG immediate release tablet Take 1 tablet (5 mg total) by mouth every 8 (eight) hours as needed for severe pain (pain score 7-10). 90 tablet 0   potassium chloride  (KLOR-CON  10) 10 MEQ tablet Take 1 tablet (10 mEq total) by mouth 2 (two) times daily. 180 tablet 0   RABEprazole  (ACIPHEX ) 20 MG tablet Take 1 tablet (20 mg total) by mouth daily. (Patient not taking: Reported on 06/18/2024) 90 tablet 1   No current facility-administered medications on file prior to visit.    Review of Systems     Objective:  There were no vitals filed for this  visit. BP Readings from Last 3 Encounters:  06/18/24 138/78  01/09/24 118/68  12/05/23 112/68   Wt Readings from Last 3 Encounters:  06/18/24 130 lb 12.8 oz (59.3 kg)  01/09/24 131 lb (59.4 kg)  12/05/23 134 lb 11.2 oz (61.1 kg)   There is no height or weight on file to calculate BMI.    Physical Exam         Assessment & Plan:    See Problem List for Assessment and Plan of chronic medical problems.

## 2024-09-09 NOTE — Telephone Encounter (Signed)
 FYI Only or Action Required?: Action required by provider: medication refill request and not sleeping/ s/p fall .  Patient was last seen in primary care on 06/18/2024 by Joshua Debby CROME, MD.  Called Nurse Triage reporting Fall.  Symptoms began several weeks ago.  Interventions attempted: Prescription medications: oxycodone .  Symptoms are: gradually worsening.  Triage Disposition: Call PCP Within 24 Hours  Patient/caregiver understands and will follow disposition?: Yes        Copied from CRM #[id number]. Topic: Clinical - Red Word Triage >> Sep 09, 2024 10:41 AM Turkey A wrote: [facility] that prompted transfer to Nurse Triage: Patient states that her medication Ambien  was taken away by doctor and now she is experiencing shaky,dizziness. Patient fell down the stairs a week and has pain. Reason for Disposition  [1] Caller has NON-URGENT question AND [2] triager unable to answer question    Caller needs evaluation due to fall  Answer Assessment - Initial Assessment Questions Recommended patient do OV today with other provider. None available with PCP. Patient declined due to not feeling well and does not feel safe to drive due to shakiness. Advised patient to be evaluated as soon as possible for fall couple of weeks ago bc patient taking eliquis . Patient agreed to appt tomorrow and scheduled, if she felt better to drive. Recommended calling family or friend to assist with transportation if needed and if sx worsen go to ED. Patient reports she has not slept after PCP would not refill Ambien  and she has been taking more than prescribed doses of oxycodone  approx 4 /day than 3 per day .        1. MECHANISM: How did the fall happen?     Fell down stairs  2. DOMESTIC VIOLENCE AND ELDER ABUSE SCREENING: Did you fall because someone pushed you or tried to hurt you? If Yes, ask: Are you safe now?     Na  3. ONSET: When did the fall happen? (e.g., minutes, hours, or days ago)      Couple of weeks ago  4. LOCATION: What part of the body hit the ground? (e.g., back, buttocks, head, hips, knees, hands, head, stomach)     Right side of body and denies hitting head 5. INJURY: Did you hurt (injure) yourself when you fell? If Yes, ask: What did you injure? Tell me more about this? (e.g., body area; type of injury; pain severity)     Denies injury but reports back pain continued 6. PAIN: Is there any pain? If Yes, ask: How bad is the pain? (e.g., Scale 0-10; or none, mild,      6-7/10 , back  7. SIZE: For cuts, bruises, or swelling, ask: How large is it? (e.g., inches or centimeters)      Denies  8. PREGNANCY: Is there any chance you are pregnant? When was your last menstrual period?     na 9. OTHER SYMPTOMS: Do you have any other symptoms? (e.g., dizziness, fever, weakness; new-onset or worsening).      Dizziness, shaky, denies hitting head after fall and is taking eliquis . Not sleeping more than 2-3 hours at night and hard time falling asleep. 10. CAUSE: What do you think caused the fall (or falling)? (e.g., dizzy spell, tripped)       Patient did not report, other than I dont know.  Protocols used: Falls and [facility]

## 2024-09-10 ENCOUNTER — Ambulatory Visit: Admitting: Internal Medicine

## 2024-09-10 DIAGNOSIS — M24152 Other articular cartilage disorders, left hip: Secondary | ICD-10-CM | POA: Diagnosis not present

## 2024-09-10 MED ORDER — ZOLPIDEM TARTRATE ER 6.25 MG PO TBCR: 6.2500 mg | EXTENDED_RELEASE_TABLET | Freq: Every evening | ORAL | 3 refills | Status: DC | PRN

## 2024-09-10 MED ORDER — OXYCODONE HCL 5 MG PO TABS: 5.0000 mg | ORAL_TABLET | Freq: Four times a day (QID) | ORAL | 0 refills | Status: DC | PRN

## 2024-09-10 NOTE — Patient Instructions (Addendum)
        Medications changes include :   None

## 2024-09-23 NOTE — Progress Notes (Signed)
 Remote Loop Recorder Transmission

## 2024-09-24 NOTE — Progress Notes (Signed)
 Remote Loop Recorder Transmission

## 2024-09-25 ENCOUNTER — Ambulatory Visit (INDEPENDENT_AMBULATORY_CARE_PROVIDER_SITE_OTHER): Payer: Self-pay

## 2024-09-25 ENCOUNTER — Ambulatory Visit: Payer: Self-pay | Admitting: Cardiology

## 2024-09-25 ENCOUNTER — Other Ambulatory Visit: Payer: Self-pay | Admitting: Internal Medicine

## 2024-09-25 DIAGNOSIS — I639 Cerebral infarction, unspecified: Secondary | ICD-10-CM | POA: Diagnosis not present

## 2024-09-25 DIAGNOSIS — M24152 Other articular cartilage disorders, left hip: Secondary | ICD-10-CM

## 2024-09-25 LAB — CUP PACEART REMOTE DEVICE CHECK
Date Time Interrogation Session: 20251001230140
Implantable Pulse Generator Implant Date: 20200818

## 2024-09-25 MED ORDER — OXYCODONE HCL 5 MG PO TABS
5.0000 mg | ORAL_TABLET | Freq: Three times a day (TID) | ORAL | 0 refills | Status: DC | PRN
Start: 2024-09-25 — End: 2024-10-28

## 2024-09-25 NOTE — Telephone Encounter (Signed)
 Copied from CRM [id number]. Topic: Clinical - Medication Refill >> Sep 25, 2024  8:50 AM Revonda D wrote: Medication: oxyCODONE  (OXY IR/ROXICODONE ) 5 MG immediate release tablet   Has the patient contacted their pharmacy? Yes (Agent: If no, request that the patient contact the pharmacy for the refill. If patient does not wish to contact the pharmacy document the reason why and proceed with request.) (Agent: If yes, when and what did the pharmacy advise?)  This is the patient's preferred pharmacy:  [facility] #[id number], [place] - [address] AT [facility] [address] [place] [address] Phone: [phone] Fax: [phone]  Is this the correct pharmacy for this prescription? Yes If no, delete pharmacy and type the correct one.   Has the prescription been filled recently? No  Is the patient out of the medication? No  Has the patient been seen for an appointment in the last year OR does the patient have an upcoming appointment? Yes  Can we respond through MyChart? Yes  Agent: Please be advised that Rx refills may take up to 3 business days. We ask that you follow-up with your pharmacy.

## 2024-09-25 NOTE — Progress Notes (Signed)
 Remote Loop Recorder Transmission

## 2024-10-01 ENCOUNTER — Other Ambulatory Visit (HOSPITAL_BASED_OUTPATIENT_CLINIC_OR_DEPARTMENT_OTHER): Payer: Self-pay | Admitting: Nurse Practitioner

## 2024-10-08 ENCOUNTER — Telehealth: Payer: Self-pay

## 2024-10-08 DIAGNOSIS — M810 Age-related osteoporosis without current pathological fracture: Secondary | ICD-10-CM

## 2024-10-08 MED ORDER — DENOSUMAB 60 MG/ML ~~LOC~~ SOSY: 60.0000 mg | PREFILLED_SYRINGE | Freq: Once | SUBCUTANEOUS | Status: AC

## 2024-10-08 NOTE — Telephone Encounter (Signed)
 Patient cleared for Prolia, but has not scheduled. Patient contacted in 05/2024 to schedule, please call to schedule or see if still willing to take medication.   Note on 2/18 indicates patients starting Prolia.  Patient is cleared for PROLIA injection; PA information on 02/13/24.  Mentioned in provider note last on Patient finished 1 year of Evenity  injections ready to start Prolia; LAST OV 01/09/2024  Medication is CLINIC supplied.  CoPay:$0

## 2024-10-08 NOTE — Telephone Encounter (Signed)
 Sent updated orders for review of benefits to PA team for Prolia

## 2024-10-09 ENCOUNTER — Other Ambulatory Visit (HOSPITAL_COMMUNITY): Payer: Self-pay

## 2024-10-09 ENCOUNTER — Telehealth: Payer: Self-pay

## 2024-10-09 ENCOUNTER — Other Ambulatory Visit: Payer: Self-pay | Admitting: Internal Medicine

## 2024-10-09 DIAGNOSIS — I6300 Cerebral infarction due to thrombosis of unspecified precerebral artery: Secondary | ICD-10-CM

## 2024-10-09 NOTE — Telephone Encounter (Signed)
 Pt ready for scheduling for PROLIA on or after : 10/09/24  Option# 1: Buy/Bill (Office supplied medication)  Out-of-pocket cost due at time of clinic visit: $0  Number of injection/visits approved: ---  Primary: MEDICARE Prolia co-insurance: 0% Admin fee co-insurance: 0%  Secondary: AETNA-MEDSUP Prolia co-insurance:  Admin fee co-insurance:   Medical Benefit Details: Date Benefits were checked: 10/09/24 Deductible: $257 Met of $257 Required/ Coinsurance: 0%/ Admin Fee: 0%  Prior Auth: N/A PA# Expiration Date:   # of doses approved: ----------------------------------------------------------------------- Option# 2- Med Obtained from pharmacy:  Pharmacy benefit: Copay $0 (Paid to pharmacy) Admin Fee: 0% (Pay at clinic)  Prior Auth: N/A PA# Expiration Date:   # of doses approved:   If patient wants fill through the pharmacy benefit please send prescription to: [facility], and include estimated need by date in rx notes. Pharmacy will ship medication directly to the office.  Patient NOT eligible for Prolia Copay Card. Copay Card can make patient's cost as little as $25. Link to apply: [email or web address]://[email or web address].[email or web address]/copay  ** This summary of benefits is an estimation of the patient's out-of-pocket cost. Exact cost may very based on individual plan coverage.

## 2024-10-09 NOTE — Telephone Encounter (Signed)
 Prolia  VOB initiated via [email or web address]  Next Prolia  inj DUE: NEW START

## 2024-10-09 NOTE — Telephone Encounter (Signed)
 SABRA

## 2024-10-10 ENCOUNTER — Ambulatory Visit: Payer: Self-pay

## 2024-10-10 NOTE — Telephone Encounter (Signed)
 FYI Only or Action Required?: Action required by provider: new medication request.  Patient was last seen in primary care on 09/10/2024 by Geofm Glade PARAS, MD.  Called Nurse Triage reporting Nausea.  Symptoms began a week ago.  Interventions attempted: Rest, hydration, or home remedies.  Symptoms are: unchanged.  Triage Disposition: Call PCP Within 24 Hours  Patient/caregiver understands and will follow disposition?: No, wishes to speak with PCP   Message from [facility] sent at 10/10/2024  2:22 PM EDT  Reason for Triage: Pt says she has not been able to sleep for about a week. She says she is very nauseous as well. She has tried otc medications and nothing is helping at all. She says she does not know why this is happening and has not happened before. [phone]   Reason for Disposition  Taking prescription medication that could cause nausea (e.g., narcotics/opiates, antibiotics, OCPs, many others)  Answer Assessment - Initial Assessment Questions Additional info: Using otc nausea medicine but not helping. She is also not sleeping well for one week. She reports no increased stress and not sure why she is feeling this way. She is requesting medication prescription from pcp for both nausea and insomnia, requesting short term supply. Declines office visit    1. NAUSEA SEVERITY: How bad is the nausea? (e.g., mild, moderate, severe; dehydration, weight loss)     Moderate  2. ONSET: When did the nausea begin?     One week ago  3. VOMITING: Any vomiting? If Yes, ask: How many times today?     no 4. RECURRENT SYMPTOM: Have you had nausea before? If Yes, ask: When was the last time? What happened that time?     no 5. CAUSE: What do you think is causing the nausea?     unsure  Protocols used: Nausea-A-AH

## 2024-10-11 ENCOUNTER — Other Ambulatory Visit: Payer: Self-pay | Admitting: Internal Medicine

## 2024-10-13 NOTE — Telephone Encounter (Signed)
 Unable to reach patient. LMTRC

## 2024-10-14 NOTE — Telephone Encounter (Signed)
 Unable to reach patient. LMTRC

## 2024-10-23 ENCOUNTER — Other Ambulatory Visit: Payer: Self-pay | Admitting: Internal Medicine

## 2024-10-23 DIAGNOSIS — M24152 Other articular cartilage disorders, left hip: Secondary | ICD-10-CM

## 2024-10-23 NOTE — Telephone Encounter (Signed)
 Copied from CRM [id number]. Topic: Clinical - Medication Refill >> Oct 23, 2024  8:03 AM Ahlexyia S wrote: Medication: oxyCODONE  (OXY IR/ROXICODONE ) 5 MG immediate release tablet  Has the patient contacted their pharmacy? No, pt stated she always calls the clinic for refills. (Agent: If no, request that the patient contact the pharmacy for the refill. If patient does not wish to contact the pharmacy document the reason why and proceed with request.) (Agent: If yes, when and what did the pharmacy advise?)  This is the patient's preferred pharmacy:  [id number] [address],  - [address] AT [facility] [address] [place] [address] Phone: [phone] Fax: [phone]  Is this the correct pharmacy for this prescription? Yes If no, delete pharmacy and type the correct one.   Has the prescription been filled recently? Yes  Is the patient out of the medication? Yes  Has the patient been seen for an appointment in the last year OR does the patient have an upcoming appointment? Yes  Can we respond through MyChart? Yes  Agent: Please be advised that Rx refills may take up to 3 business days. We ask that you follow-up with your pharmacy.

## 2024-10-23 NOTE — Telephone Encounter (Signed)
 Unable to reach patient [facility]. this is the 3rd and final attempt. I will close this call out.

## 2024-10-27 ENCOUNTER — Ambulatory Visit: Payer: Self-pay

## 2024-10-28 ENCOUNTER — Ambulatory Visit: Payer: Self-pay

## 2024-10-28 ENCOUNTER — Encounter: Payer: Self-pay | Admitting: Family Medicine

## 2024-10-28 ENCOUNTER — Ambulatory Visit: Admitting: Family Medicine

## 2024-10-28 ENCOUNTER — Telehealth: Payer: Self-pay | Admitting: Internal Medicine

## 2024-10-28 VITALS — BP 126/70 | HR 96 | Temp 97.8°F | Ht 64.0 in | Wt 123.8 lb

## 2024-10-28 DIAGNOSIS — Z79891 Long term (current) use of opiate analgesic: Secondary | ICD-10-CM

## 2024-10-28 DIAGNOSIS — M24152 Other articular cartilage disorders, left hip: Secondary | ICD-10-CM | POA: Diagnosis not present

## 2024-10-28 MED ORDER — OXYCODONE HCL 5 MG PO TABS: 5.0000 mg | ORAL_TABLET | Freq: Three times a day (TID) | ORAL | 0 refills | Status: DC | PRN

## 2024-10-28 NOTE — Patient Instructions (Signed)
 Refilled oxycodone  today.   Please make a follow up appointment with Dr Joshua for about 3 months.

## 2024-10-28 NOTE — Progress Notes (Signed)
 Acute Office Visit  Subjective:     Patient ID: Connie Meyer, female    DOB: [birth year], 73 y.o.   MRN: [id number]  No chief complaint on file.   HPI  Discussed the use of AI scribe software for clinical note transcription with the patient, who gave verbal consent to proceed.  History of Present Illness Connie Meyer is a 73 year old female who presents for a medication refill for chronic hip pain.  Chronic hip pain - Persistent pain following prior hip surgery - Pain intensity is 6 out of 10 without current medication - Requires ongoing medication for pain control - Last OV to evaluate hip pain was with Dr. Geofm on 09/10/2024 - Last drug screen with Dr. Joshua was 06/18/2024     ROS Per HPI      Objective:    BP 126/70 (BP Location: Left Arm, Patient Position: Sitting)   Pulse 96   Temp 97.8 F (36.6 C) (Temporal)   Ht 5' 4 (1.626 m)   Wt 123 lb 12.8 oz (56.2 kg)   SpO2 97%   BMI 21.25 kg/m    Physical Exam Vitals and nursing note reviewed.  Constitutional:      General: She is not in acute distress.    Appearance: Normal appearance. She is normal weight.  HENT:     Head: Normocephalic and atraumatic.     Right Ear: External ear normal.     Left Ear: External ear normal.     Nose: Nose normal.     Mouth/Throat:     Mouth: Mucous membranes are moist.     Pharynx: Oropharynx is clear.  Eyes:     Extraocular Movements: Extraocular movements intact.     Pupils: Pupils are equal, round, and reactive to light.  Cardiovascular:     Rate and Rhythm: Normal rate and regular rhythm.     Pulses: Normal pulses.     Heart sounds: Normal heart sounds.  Pulmonary:     Effort: Pulmonary effort is normal. No respiratory distress.     Breath sounds: Normal breath sounds. No wheezing, rhonchi or rales.  Musculoskeletal:     Cervical back: Normal range of motion.     Right lower leg: No edema.     Left lower leg: No edema.     Comments: Limping  to favor L hip  Lymphadenopathy:     Cervical: No cervical adenopathy.  Neurological:     General: No focal deficit present.     Mental Status: She is alert and oriented to person, place, and time.  Psychiatric:        Mood and Affect: Mood normal.        Thought Content: Thought content normal.     No results found for any visits on 10/28/24.      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 - Refilled current pain medication. - Referred to Dr. Joshua for further evaluation and management.      No orders of the defined types were placed in this encounter.    Meds ordered this encounter  Medications   oxyCODONE  (OXY IR/ROXICODONE ) 5 MG immediate release tablet    Sig: Take 1 tablet (5 mg total) by mouth every 8 (eight) hours as needed for severe pain (pain score 7-10).    Dispense:  90 tablet    Refill:  0    Return in about 3 months (around 01/28/2025) for Dr Joshua, pain mgt.  Corean LITTIE Ku, FNP
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
## 2024-10-28 NOTE — Telephone Encounter (Signed)
 Copied from CRM [id number]. Topic: Clinical - Prescription Issue >> Oct 27, 2024  9:18 AM Roselie BROCKS wrote: Reason for CRM: Patient called because she states her script for  oxyCODONE  (OXY IR/ROXICODONE ) 5 MG isn't filled, I do show it was requested on 10-23-24, and it does take 3 business days to fill. >> Oct 28, 2024  9:38 AM Suzen RAMAN wrote: Patient called to check on the status of medication refill for oxycodone . Per system medication refill is still pending. Patient advised that today would be considered the 3rd business day and an additional message will be sent pertaining to medication refill.

## 2024-10-29 ENCOUNTER — Other Ambulatory Visit: Payer: Self-pay | Admitting: Internal Medicine

## 2024-10-29 DIAGNOSIS — I10 Essential (primary) hypertension: Secondary | ICD-10-CM

## 2024-10-31 NOTE — Telephone Encounter (Signed)
 Unable to reach patient. Left a very detailed message about her medication being denied because she hasn't seen Dr.Jones. She can have her medication refilled after she's seen on the 19th.

## 2024-11-06 ENCOUNTER — Ambulatory Visit: Attending: Internal Medicine

## 2024-11-06 DIAGNOSIS — I639 Cerebral infarction, unspecified: Secondary | ICD-10-CM | POA: Diagnosis not present

## 2024-11-06 LAB — CUP PACEART REMOTE DEVICE CHECK
Date Time Interrogation Session: 20251112230505
Implantable Pulse Generator Implant Date: 20200818

## 2024-11-11 NOTE — Progress Notes (Signed)
 Remote Loop Recorder Transmission

## 2024-11-12 ENCOUNTER — Ambulatory Visit: Payer: Self-pay | Admitting: Internal Medicine

## 2024-11-12 ENCOUNTER — Encounter: Payer: Self-pay | Admitting: Internal Medicine

## 2024-11-12 ENCOUNTER — Ambulatory Visit (INDEPENDENT_AMBULATORY_CARE_PROVIDER_SITE_OTHER): Admitting: Internal Medicine

## 2024-11-12 ENCOUNTER — Ambulatory Visit: Payer: Self-pay | Admitting: Cardiology

## 2024-11-12 VITALS — BP 126/74 | HR 100 | Temp 97.8°F | Ht 64.0 in | Wt 128.6 lb

## 2024-11-12 DIAGNOSIS — I10 Essential (primary) hypertension: Secondary | ICD-10-CM

## 2024-11-12 DIAGNOSIS — M24152 Other articular cartilage disorders, left hip: Secondary | ICD-10-CM

## 2024-11-12 DIAGNOSIS — M810 Age-related osteoporosis without current pathological fracture: Secondary | ICD-10-CM | POA: Diagnosis not present

## 2024-11-12 DIAGNOSIS — Z23 Encounter for immunization: Secondary | ICD-10-CM | POA: Insufficient documentation

## 2024-11-12 DIAGNOSIS — M839 Adult osteomalacia, unspecified: Secondary | ICD-10-CM

## 2024-11-12 LAB — BASIC METABOLIC PANEL WITH GFR
BUN: 13 mg/dL (ref 6–23)
CO2: 26 meq/L (ref 19–32)
Calcium: 9.6 mg/dL (ref 8.4–10.5)
Chloride: 107 meq/L (ref 96–112)
Creatinine, Ser: 0.78 mg/dL (ref 0.40–1.20)
GFR: 75 mL/min (ref >60.00)
Glucose, Bld: 83 mg/dL (ref 70–99)
Potassium: 4.4 meq/L (ref 3.5–5.1)
Sodium: 141 meq/L (ref 135–145)

## 2024-11-12 LAB — VITAMIN D 25 HYDROXY (VIT D DEFICIENCY, FRACTURES): VITD: 21.26 ng/mL — ABNORMAL LOW (ref 30.00–100.00)

## 2024-11-12 LAB — PHOSPHORUS: Phosphorus: 4 mg/dL (ref 2.3–4.6)

## 2024-11-12 MED ORDER — DENOSUMAB 60 MG/ML ~~LOC~~ SOSY: 60.0000 mg | PREFILLED_SYRINGE | Freq: Once | SUBCUTANEOUS | Status: AC

## 2024-11-12 MED ORDER — CHOLECALCIFEROL 1.25 MG (50000 UT) PO CAPS
50000.0000 [IU] | ORAL_CAPSULE | ORAL | 0 refills | Status: AC
Start: 2024-11-12 — End: ?

## 2024-11-12 MED ORDER — SHINGRIX 50 MCG/0.5ML IM SUSR: 0.5000 mL | Freq: Once | INTRAMUSCULAR | 1 refills | Status: AC

## 2024-11-12 NOTE — Patient Instructions (Signed)
 Weak, Fragile Bones (Osteoporosis): What to Know  Osteoporosis is when the bones become thin and less dense than normal. Osteoporosis makes bones more fragile and more likely to break. Over time, the condition can cause your bones to become so weak that they break, or fracture, after a minor fall. Bones in the hip, wrist, and spine are most likely to break. What are the causes? The exact cause of this condition is not known. What increases the risk? Having family members with this condition. Taking: Steroid medicines. Anti-seizure medicines. Being female. Being 72 years old or older. Being of [place] decent. Smoking or using other products that contain nicotine or tobacco. Not exercising. What are the signs or symptoms? A broken bone might be the first sign, especially if the break results from a fall or injury that usually would not cause a bone to break. Other signs and symptoms include: Pain in the neck or low back. Being hunched over. Getting shorter. How is this diagnosed? This condition may be diagnosed based on: Your medical history. A physical exam. A bone mineral density test, also called a DXA or DEXA test. This test uses X-rays to measure how dense your bones are. How is this treated? This condition may be treated with medicines and supplements, including: Taking medicines to slow bone loss or help make the bones stronger. Taking calcium and vitamin D supplements every day. Taking hormone replacement medicines, such as estrogen for female and testosterone for males. It may also be treated by: Quitting smoking or using tobacco products. Doing exercises. Limiting how much alcohol you drink. Eating more foods with calcium and vitamin D in them. Monitoring your blood levels of calcium and vitamin D. The goal of treatment is to strengthen your bones and lower your risk for a bone break. Follow these instructions at home: Eating and drinking Eat plenty of food with  calcium and vitamin D in them. This may include: Some fish, such as salmon and tuna. Foods that have calcium and vitamin D added to them, such as some cereals. Egg yolks. Cheese. Liver.  Activity Exercise as told. Ask what things are safe for you to do. You may be told to: Do exercises that make your muscles work to hold your body weight up, such as tai chi, yoga, or walking. These are called weight-bearing exercises. Do exercises to make your muscles stronger, such as lifting weights. These are called muscle-strengthening exercises. Lifestyle Do not drink alcohol if your health care provider tells you not to drink. Your health care provider tells you not to drink. You are pregnant, may be pregnant, or are planning to become pregnant. If you drink alcohol: Limit how much you have to: 0-1 drink a day if you're female. 0-2 drinks a day if you're female. Know how much alcohol is in your drink. In the [place] one drink is one 12 oz bottle of beer (355 mL), one 5 oz glass of wine (148 mL), or one 1 oz glass of hard liquor (44 mL). Do not smoke, vape, or use nicotine or tobacco. Preventing falls Use a cane, walker, scooter, or crutches to help you move around if needed. Keep rooms well-lit and get rid of clutter. Put away things on the floor that could make you trip, such as cords and rugs. Put grab bars in bathrooms and safety rails on stairs. Use rubber mats in slippery areas, like bathrooms. Wear shoes that: Fit you well. Support your feet. Have closed toes. Have rubber soles or low  heels. Talk to your provider about all of the medicines you take. Some medicines can make you more likely to fall because they can cause dizziness or changes in blood pressure. General instructions Take your medicines only as told. Keep all follow-up visits. Your provider may want to repeat tests. Where to find more information Bone Health and Osteoporosis Foundation: bonehealthandosteoporosis.org Contact  a health care provider if: You have never been screened for osteoporosis and you are: A female who is 16 years old or older. A female who is age 28 years old or older. Get help right away if: You fall. You get hurt. This information is not intended to replace advice given to you by your health care provider. Make sure you discuss any questions you have with your health care provider. Document Revised: 10/23/2023 Document Reviewed: 06/29/2023 Elsevier Patient Education  2024 [employer].

## 2024-11-12 NOTE — Progress Notes (Signed)
 Subjective:  Patient ID: Connie Meyer, female    DOB: [birth year]  Age: 73 y.o. MRN: [id number]  CC: Hip Pain (Left hip, Patient states that she has had this hip pain for years. ) and Hypertension   HPI Connie Meyer presents for f/up ---  Discussed the use of AI scribe software for clinical note transcription with the patient, who gave verbal consent to proceed.  History of Present Illness Connie Meyer is a 73 year old female who presents for a follow-up visit.  Her hip pain is adequately controlled, localized to the hip with no radiation, and has remained stable over time. No new pain in other areas.  She is no longer taking Ambien  and reports sleeping well without it. She continues to take amlodipine  and atorvastatin  but has stopped taking Jardiance . She previously received Prolia  injections but is no longer receiving them. She is also taking Repatha .  Her past medical history includes breast cancer, and she has not had a mammogram recently. Her last colonoscopy was eight years ago, which was normal with no polyps. No bowel movement issues. Her last bone density test was approximately two years ago, but she does not recall the details or if a follow-up is scheduled.   Outpatient Medications Prior to Visit  Medication Sig Dispense Refill   amLODipine  (NORVASC ) 10 MG tablet TAKE 1 TABLET(10 MG) BY MOUTH DAILY. NEEDS APPOINTMENT WITH PCP FOR REFILLS. 90 tablet 0   apixaban  (ELIQUIS ) 5 MG TABS tablet TAKE 1 TABLET(5 MG) BY MOUTH TWICE DAILY 60 tablet 0   atorvastatin  (LIPITOR ) 40 MG tablet Take 1 tablet (40 mg total) by mouth daily at 6 PM. 90 tablet 0   bimatoprost  (LATISSE ) 0.03 % ophthalmic solution daily.     empagliflozin  (JARDIANCE ) 10 MG TABS tablet Take 1 tablet (10 mg total) by mouth daily before breakfast. 90 tablet 0   oxyCODONE  (OXY IR/ROXICODONE ) 5 MG immediate release tablet Take 1 tablet (5 mg total) by mouth every 8 (eight) hours as needed for  severe pain (pain score 7-10). 90 tablet 0   potassium chloride  (KLOR-CON  10) 10 MEQ tablet Take 1 tablet (10 mEq total) by mouth 2 (two) times daily. 180 tablet 0   REPATHA  SURECLICK 140 MG/ML SOAJ ADMINISTER 1 ML UNDER THE SKIN EVERY 14 DAYS 6 mL 3   zolpidem  (AMBIEN  CR) 6.25 MG CR tablet Take 1 tablet (6.25 mg total) by mouth at bedtime as needed. 30 tablet 3   Facility-Administered Medications Prior to Visit  Medication Dose Route Frequency Provider Last Rate Last Admin   denosumab  (PROLIA ) injection 60 mg  60 mg Subcutaneous Once Joshua Debby CROME, MD        ROS Review of Systems  Constitutional:  Negative for appetite change, chills, diaphoresis, fatigue and fever.  HENT: Negative.    Eyes: Negative.   Respiratory: Negative.  Negative for cough, chest tightness, shortness of breath and wheezing.   Cardiovascular:  Negative for chest pain, palpitations and leg swelling.  Gastrointestinal: Negative.  Negative for abdominal pain, blood in stool, constipation, diarrhea, nausea and vomiting.  Endocrine: Negative.   Genitourinary:  Negative for difficulty urinating and dysuria.  Musculoskeletal:  Positive for arthralgias. Negative for myalgias.  Skin: Negative.  Negative for color change and pallor.  Neurological: Negative.  Negative for dizziness, weakness and light-headedness.  Hematological:  Negative for adenopathy. Does not bruise/bleed easily.  Psychiatric/Behavioral: Negative.      Objective:  BP 126/74 (BP Location: Left Arm,  Patient Position: Sitting, Cuff Size: Normal)   Pulse 100   Temp 97.8 F (36.6 C) (Oral)   Ht 5' 4 (1.626 m)   Wt 128 lb 9.6 oz (58.3 kg)   SpO2 94%   BMI 22.07 kg/m   BP Readings from Last 3 Encounters:  11/12/24 126/74  10/28/24 126/70  09/10/24 138/80    Wt Readings from Last 3 Encounters:  11/12/24 128 lb 9.6 oz (58.3 kg)  10/28/24 123 lb 12.8 oz (56.2 kg)  09/10/24 123 lb (55.8 kg)    Physical Exam Vitals reviewed.  Constitutional:       Appearance: Normal appearance.  HENT:     Mouth/Throat:     Mouth: Mucous membranes are moist.  Eyes:     General: No scleral icterus.    Conjunctiva/sclera: Conjunctivae normal.  Cardiovascular:     Rate and Rhythm: Normal rate and regular rhythm.     Heart sounds: No murmur heard.    No friction rub. No gallop.  Pulmonary:     Effort: Pulmonary effort is normal.     Breath sounds: No stridor. No wheezing, rhonchi or rales.  Abdominal:     General: Abdomen is flat.     Palpations: There is no mass.     Tenderness: There is no abdominal tenderness. There is no guarding.     Hernia: No hernia is present.  Musculoskeletal:        General: Normal range of motion.     Cervical back: Neck supple.     Right lower leg: No edema.     Left lower leg: No edema.  Lymphadenopathy:     Cervical: No cervical adenopathy.  Skin:    General: Skin is warm and dry.  Neurological:     General: No focal deficit present.     Mental Status: She is alert.  Psychiatric:        Mood and Affect: Mood normal.        Behavior: Behavior normal.     Lab Results  Component Value Date   WBC 4.5 06/18/2024   HGB 12.6 06/18/2024   HCT 37.9 06/18/2024   PLT 194.0 06/18/2024   GLUCOSE 83 11/12/2024   CHOL 114 06/18/2024   TRIG 74.0 06/18/2024   HDL 61.60 06/18/2024   LDLCALC 38 06/18/2024   ALT 17 06/18/2024   AST 23 06/18/2024   NA 141 11/12/2024   K 4.4 11/12/2024   CL 107 11/12/2024   CREATININE 0.78 11/12/2024   BUN 13 11/12/2024   CO2 26 11/12/2024   TSH 2.44 01/09/2024   INR 1.0 08/11/2019   HGBA1C 5.9 06/06/2021    CT CORONARY MORPH W/CTA COR W/SCORE W/CA W/CM &/OR WO/CM Addendum Date: 10/23/2023 ADDENDUM REPORT: 10/23/2023 10:01 EXAM: OVER-READ INTERPRETATION  CT CHEST The following report is an over-read performed by radiologist Dr. Juliene Cramp [facility] on 10/23/2023. This over-read does not include interpretation of cardiac or coronary anatomy or pathology.  The coronary CTA interpretation by the cardiologist is attached. COMPARISON:  CT 09/22/2016 FINDINGS: Bilateral breast implants. Images of the upper abdomen are unremarkable. Visualized mediastinal structures are normal. Again noted are reticular densities in the anterior subpleural region of the right lung and suspect this represents post treatment changes. Peripheral nodule in the posterior left lung base measures 7 x 5 mm on image 55/11 and this nodule is minimally changed since 2017. In addition, there is some volume loss and atelectasis at the left lung base. No acute  bone abnormality. IMPRESSION: 1. No acute extracardiac findings. 2. Probable post treatment changes in the right lung. 3. 7 mm pulmonary nodule at the left lung base is minimally changed since 2017 and likely benign. Electronically Signed   By: Juliene Balder M.D.   On: 10/23/2023 10:01   Result Date: 10/23/2023 CLINICAL DATA:  This is a 74 year old female with anginal symptoms. EXAM: Cardiac/Coronary CTA TECHNIQUE: The patient was scanned on a [employer]. FINDINGS: A 100 kV prospective scan was triggered in the descending thoracic aorta at 111 HU's. Axial non-contrast 3 mm slices were carried out through the heart. The data set was analyzed on a dedicated work station and scored using the Agatson method. Gantry rotation speed was 250 msecs and collimation was .6 mm. No beta blockade and 0.8 mg of sl NTG was given. The 3D data set was reconstructed in 5% intervals of the 67-82 % of the R-R cycle. Diastolic phases were analyzed on a dedicated work station using MPR, MIP and VRT modes. The patient received 80 cc of contrast. Image Quality: Fair with misregistration artifact. Aorta: Normal size. Mild aortic root calcifications. No dissection. Aortic Valve:  Trileaflet.  No calcifications. Coronary Arteries:  Normal coronary origin.  Right dominance. RCA is a large dominant artery that gives rise to PDA and PLA. The proximal RCA with no  plaques. The proximal to mid RCA with mild (25-49%) calcified plaque. There a dense focal moderate (50-69%) calcified plaque in the mid RCA. Distal RCA with no plaques. Left main is a large artery that gives rise to LAD and LCX arteries. LAD is a large vessel. There is minimal mixed plaque in the mid LAD. The proximal and distal LAD with no plaques. LCX is a non-dominant artery that gives rise to one large OM1 branch. There is minimal (<24%) soft plaque in the mid LCX. The mid to distal LCX with 2 seperate focal minimal calcified plaques. Coronary Calcium  Score: Left main: 0 Left anterior descending artery: 0.916 Left circumflex artery: 100 Right coronary artery: 336 Total: 437 Percentile: 83 Other findings: Normal pulmonary vein drainage into the left atrium. Normal left atrial appendage without a thrombus. Normal size of the pulmonary artery. Mild mitral annular calcification IMPRESSION: 1. Coronary calcium  score of 437. This was 31 percentile for age and sex matched control. 2. Normal coronary origin with right dominance. 3. CAD-RADS 3. Moderate stenosis. Consider symptom-guided anti-ischemic pharmacotherapy as well as risk factor modification per guideline directed care. Additional analysis with CT FFR will be submitted. The noncardiac portion of this study will be interpreted in separate report by the radiologist. Electronically Signed: By: Kardie  Tobb D.O. On: 10/01/2023 11:31   CT CORONARY FRACTIONAL FLOW RESERVE FLUID ANALYSIS Result Date: 10/01/2023 EXAM: CT FFR ANALYSIS CLINICAL DATA:  CAD FINDINGS: FFRct analysis was performed on the original cardiac CT angiogram dataset. Diagrammatic representation of the FFRct analysis is provided in a separate PDF document in PACS. This dictation was created using the PDF document and an interactive 3D model of the results. 3D model is not available in the EMR/PACS. Normal FFR range is >0.80. Indeterminate (grey) zone is 0.76-0.80. 1. Left Main: FFR = 1.00 2. LAD:  Proximal FFR = 0.97, mid FFR = 0.95, distal FFR = 0.91 3. LCX: Proximal FFR = 0.99, distal FFR = 0.95 4. RCA: Proximal FFR = 0.98, mid FFR =0.89, distal FFR = 0.89 IMPRESSION: 1.  CT FFR analysis showed no significant stenosis. RECOMMENDATIONS: Guideline-directed medical therapy and aggressive  risk factor modification for secondary prevention of coronary artery disease. Electronically Signed   By: Kardie  Tobb D.O.   On: 10/01/2023 18:50    Assessment & Plan:  Osteoporosis, unspecified osteoporosis type, unspecified pathological fracture presence -     DG Bone Density; Future -     Basic metabolic panel with GFR; Future -     VITAMIN D  25 Hydroxy (Vit-D Deficiency, Fractures); Future -     Phosphorus; Future -     Denosumab  -     Cholecalciferol ; Take 1 capsule (50,000 Units total) by mouth once a week.  Dispense: 12 capsule; Refill: 0  Primary hypertension- Her BP is well controlled. -     Basic metabolic panel with GFR; Future  Vitamin D  deficient osteomalacia -     Cholecalciferol ; Take 1 capsule (50,000 Units total) by mouth once a week.  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  Dispense: 0.5 mL; Refill: 1     Follow-up: Return in about 3 months (around 02/12/2025).  Debby Molt, MD

## 2024-11-18 ENCOUNTER — Other Ambulatory Visit: Payer: Self-pay | Admitting: Internal Medicine

## 2024-11-18 DIAGNOSIS — M24152 Other articular cartilage disorders, left hip: Secondary | ICD-10-CM

## 2024-11-18 NOTE — Telephone Encounter (Signed)
 Copied from CRM [id number]. Topic: Clinical - Medication Refill >> Nov 18, 2024  1:09 PM Leah C wrote: Medication: oxyCODONE  (OXY IR/ROXICODONE ) 5 MG immediate release tablet  Has the patient contacted their pharmacy? No. Patient was advised to see Dr. Joshua for an appointment and it would be refilled. Patient saw Dr. Joshua on 11/19 where she was told a refill would be sent it but it wasn't.   She can have her medication refilled after she's seen on the 19th. Per CMA's notes.   (Agent: If no, request that the patient contact the pharmacy for the refill. If patient does not wish to contact the pharmacy document the reason why and proceed with request.) (Agent: If yes, when and what did the pharmacy advise?)  This is the patient's preferred pharmacy:  [id number] [address], [place] - [address] AT [facility] OF [address] [place] [address] Phone: [phone] Fax: [phone]  Is this the correct pharmacy for this prescription? Yes If no, delete pharmacy and type the correct one.   Has the prescription been filled recently? Yes  Is the patient out of the medication? Yes  Has the patient been seen for an appointment in the last year OR does the patient have an upcoming appointment? Yes  Can we respond through MyChart? Yes  Agent: Please be advised that Rx refills may take up to 3 business days. We ask that you follow-up with your pharmacy.

## 2024-11-24 MED ORDER — OXYCODONE HCL 5 MG PO TABS: 5.0000 mg | ORAL_TABLET | Freq: Three times a day (TID) | ORAL | 0 refills | Status: DC | PRN

## 2024-12-03 ENCOUNTER — Other Ambulatory Visit: Payer: Self-pay | Admitting: Internal Medicine

## 2024-12-03 DIAGNOSIS — I6300 Cerebral infarction due to thrombosis of unspecified precerebral artery: Secondary | ICD-10-CM

## 2024-12-07 ENCOUNTER — Ambulatory Visit: Attending: Cardiology

## 2024-12-07 DIAGNOSIS — I639 Cerebral infarction, unspecified: Secondary | ICD-10-CM | POA: Diagnosis not present

## 2024-12-08 ENCOUNTER — Telehealth: Payer: Self-pay | Admitting: Internal Medicine

## 2024-12-08 DIAGNOSIS — M24152 Other articular cartilage disorders, left hip: Secondary | ICD-10-CM

## 2024-12-08 NOTE — Telephone Encounter (Unsigned)
 Copied from CRM [id number]. Topic: Clinical - Medication Refill >> Dec 08, 2024  2:34 PM Drema MATSU wrote: Medication: oxyCODONE  (OXY IR/ROXICODONE ) 5 MG immediate release tablet * just want the request in before holidays she doesn't need it until 12/31  Has the patient contacted their pharmacy? no (Agent: If no, request that the patient contact the pharmacy for the refill. If patient does not wish to contact the pharmacy document the reason why and proceed with request.) (Agent: If yes, when and what did the pharmacy advise?)  This is the patient's preferred pharmacy:  [id number] [address], [place] - [address] AT [facility] OF [address] [place] [address] Phone: [phone] Fax: [phone]  Is this the correct pharmacy for this prescription? Yes If no, delete pharmacy and type the correct one.   Has the prescription been filled recently? Yes  Is the patient out of the medication? No  Has the patient been seen for an appointment in the last year OR does the patient have an upcoming appointment? Yes  Can we respond through MyChart? Yes  Agent: Please be advised that Rx refills may take up to 3 business days. We ask that you follow-up with your pharmacy.

## 2024-12-08 NOTE — Telephone Encounter (Unsigned)
 Copied from CRM #[id number]. Topic: Clinical - Medication Refill >> Dec 08, 2024  2:31 PM Dedra B wrote: Medication: oxyCODONE  (OXY IR/ROXICODONE ) 5 MG immediate release tablet  Has the patient contacted their pharmacy? No, calls for refills  This is the patient's preferred pharmacy:  [id number] [address], [place] - [address] AT [facility] OF [address] [place] [address] Phone: [phone] Fax: [phone]  Is this the correct pharmacy for this prescription? Yes  Has the prescription been filled recently? No  Is the patient out of the medication? No  Has the patient been seen for an appointment in the last year OR does the patient have an upcoming appointment? Yes  Can we respond through MyChart? Yes  Agent: Please be advised that Rx refills may take up to 3 business days. We ask that you follow-up with your pharmacy.

## 2024-12-09 LAB — CUP PACEART REMOTE DEVICE CHECK
Date Time Interrogation Session: 20251213230413
Implantable Pulse Generator Implant Date: 20200818

## 2024-12-12 NOTE — Progress Notes (Signed)
 Remote Loop Recorder Transmission

## 2024-12-22 ENCOUNTER — Encounter (HOSPITAL_BASED_OUTPATIENT_CLINIC_OR_DEPARTMENT_OTHER): Payer: Self-pay | Admitting: Nurse Practitioner

## 2024-12-23 ENCOUNTER — Telehealth: Payer: Self-pay

## 2024-12-23 ENCOUNTER — Other Ambulatory Visit: Payer: Self-pay

## 2024-12-23 ENCOUNTER — Ambulatory Visit: Payer: Self-pay | Admitting: Cardiology

## 2024-12-23 DIAGNOSIS — M24152 Other articular cartilage disorders, left hip: Secondary | ICD-10-CM

## 2024-12-23 MED ORDER — OXYCODONE HCL 5 MG PO TABS: 5.0000 mg | ORAL_TABLET | Freq: Three times a day (TID) | ORAL | 0 refills | Status: DC | PRN

## 2024-12-23 NOTE — Telephone Encounter (Signed)
 Patient called in after speaking with CMA this morning to ask about status of this medication being filled for her. Patient stated that she will go into withdraws if she goes off of this medication for too long.

## 2024-12-23 NOTE — Telephone Encounter (Signed)
 Unable to reach patient. LMTRC. Medication refill request has been sent to Dr. Joshua

## 2024-12-23 NOTE — Telephone Encounter (Signed)
 Copied from CRM [id number]. Topic: Clinical - Prescription Issue >> Dec 23, 2024  8:03 AM Victoria A wrote: Reason for CRM: Please call patient Mobile [phone]  she said her refill for oxyCODONE  (OXY IR/ROXICODONE ) 5 MG immediate release tablet is not ready. Refill days says 12/09/24

## 2024-12-24 NOTE — Telephone Encounter (Signed)
 Medication has been refilled.

## 2024-12-28 ENCOUNTER — Ambulatory Visit

## 2025-01-02 ENCOUNTER — Telehealth: Payer: Self-pay | Admitting: Pharmacy Technician

## 2025-01-02 NOTE — Telephone Encounter (Signed)
 Pharmacy Patient Advocate Encounter  Received notification from [facility] that Prior Authorization for REPATHA  has been APPROVED from 12/25/24 to 01/02/26   PA #/Case ID/Reference #: [id number]

## 2025-01-02 NOTE — Telephone Encounter (Signed)
" ° °  Pharmacy Patient Advocate Encounter   Received notification from [facility] that prior authorization for repatha  is required/requested.   Insurance verification completed.   The patient is insured through [facility].   Per test claim: PA required; PA submitted to above mentioned insurance via Latent Key/confirmation #/EOC [id number] Status is pending  "

## 2025-01-07 ENCOUNTER — Ambulatory Visit: Attending: Cardiology

## 2025-01-07 DIAGNOSIS — I639 Cerebral infarction, unspecified: Secondary | ICD-10-CM | POA: Diagnosis not present

## 2025-01-07 LAB — CUP PACEART REMOTE DEVICE CHECK
Date Time Interrogation Session: 20260113230308
Implantable Pulse Generator Implant Date: 20200818

## 2025-01-09 ENCOUNTER — Ambulatory Visit: Payer: Self-pay | Admitting: Cardiology

## 2025-01-14 NOTE — Progress Notes (Signed)
 Remote Loop Recorder Transmission

## 2025-01-19 ENCOUNTER — Telehealth: Payer: Self-pay

## 2025-01-19 ENCOUNTER — Other Ambulatory Visit: Payer: Self-pay | Admitting: Internal Medicine

## 2025-01-19 DIAGNOSIS — M24152 Other articular cartilage disorders, left hip: Secondary | ICD-10-CM

## 2025-01-19 MED ORDER — OXYCODONE HCL 5 MG PO TABS: 5.0000 mg | ORAL_TABLET | Freq: Three times a day (TID) | ORAL | 0 refills | Status: AC | PRN

## 2025-01-19 NOTE — Telephone Encounter (Signed)
 Copied from CRM #[id number]. Topic: Clinical - Medication Refill >> Jan 19, 2025  8:45 AM Roselie BROCKS wrote: Medication: oxyCODONE  (OXY IR/ROXICODONE ) 5 MG immediate release tablet    Has the patient contacted their pharmacy? No (Agent: If no, request that the patient contact the pharmacy for the refill. If patient does not wish to contact the pharmacy document the reason why and proceed with request.) (Agent: If yes, when and what did the pharmacy advise?)  This is the patient's preferred pharmacy:  [id number] [address], [place] - [address] AT [facility] OF [address] [place] [address] Phone: [phone] Fax: [phone]  Is this the correct pharmacy for this prescription? Yes If no, delete pharmacy and type the correct one.   Has the prescription been filled recently? Yes  Is the patient out of the medication? No  Has the patient been seen for an appointment in the last year OR does the patient have an upcoming appointment? Yes  Can we respond through MyChart? no  Agent: Please be advised that Rx refills may take up to 3 business days. We ask that you follow-up with your pharmacy.

## 2025-01-28 ENCOUNTER — Ambulatory Visit

## 2025-01-30 ENCOUNTER — Ambulatory Visit: Payer: Self-pay

## 2025-01-30 ENCOUNTER — Telehealth: Payer: Self-pay

## 2025-01-30 NOTE — Telephone Encounter (Signed)
 Copied from CRM #[id number]. Topic: Clinical - Medication Refill >> Jan 30, 2025 11:47 AM Rea C wrote: Medication: doxycycline  (VIBRA -TABS) tablet 100 mg   Has the patient contacted their pharmacy? Pt did not call pharmacy.  (Agent: If no, request that the patient contact the pharmacy for the refill. If patient does not wish to contact the pharmacy document the reason why and proceed with request.) (Agent: If yes, when and what did the pharmacy advise?)  This is the patient's preferred pharmacy:  [facility] #[id number] [address], [place] - [address] AT [facility] OF [address] [place] [address] Phone: [phone] Fax: [phone]  Is this the correct pharmacy for this prescription? Yes If no, delete pharmacy and type the correct one.   Has the prescription been filled recently? Pt has had it refilled since a year  Is the patient out of the medication? Yes  Has the patient been seen for an appointment in the last year OR does the patient have an upcoming appointment? Last appt was Nov 19th, 25  Can we respond through MyChart? Prefer phone call.   Agent: Please be advised that Rx refills may take up to 3 business days. We ask that you follow-up with your pharmacy.

## 2025-01-30 NOTE — Telephone Encounter (Signed)
 FYI Only or Action Required?: FYI only for provider: appointment scheduled on Monday myChart .  Patient was last seen in primary care on 11/12/2024 by Joshua Debby CROME, MD.  Called Nurse Triage reporting Fall and Depression.  Symptoms began fell 2 times last week  - slipped on ice. Depression ongoing for 1 year and getting worse.  Interventions attempted: Nothing.  Symptoms are: Hip hurts more now. Depression worsening.  Triage Disposition: See PCP within 24 hours.  Patient/caregiver understands and will follow disposition?: Pt refused appts for today. Made VV appt for Monday. Gave pt numbers for [facility] resources.                       Reason for Triage: Pt had two falls she doesnt remember the exact dates, but she said it was in the last week. Pt bruised arm on left side/hip. Walking a little funny but she said its normal with bad hip but just hurts even more now since fall.   Reason for Disposition  [1] Taking Coumadin (warfarin) or other strong blood thinner AND [2] falling is a recurrent problem  [1] Depression AND [2] getting worse (e.g., sleeping poorly, less able to do activities of daily living)  Answer Assessment - Initial Assessment Questions 1. MECHANISM: How did the fall happen?     Slipped on ice 2 falls 2. DOMESTIC VIOLENCE AND ELDER ABUSE SCREENING: Did you fall because someone pushed you or tried to hurt you? If Yes, ask: Are you safe now?     no 3. ONSET: When did the fall happen? (e.g., minutes, hours, or days ago)     Unsure - last week 4. LOCATION: What part of the body hit the ground? (e.g., back, buttocks, head, hips, knees, hands, head, stomach)     Buttocks and arm 5. INJURY: Did you hurt (injure) yourself when you fell? If Yes, ask: What did you injure? Tell me more about this? (e.g., body area; type of injury; pain severity)     Hip injured is now hurting more 6. PAIN: Is there any pain? If Yes, ask: How bad is the pain?  (e.g., Scale 0-10; or none, mild,      5/10 7. SIZE: For cuts, bruises, or swelling, ask: How large is it? (e.g., inches or centimeters)      Some bruising 9. OTHER SYMPTOMS: Do you have any other symptoms? (e.g., dizziness, fever, weakness; new-onset or worsening).      no 10. CAUSE: What do you think caused the fall (or falling)? (e.g., dizzy spell, tripped)       Slipped on ice.  Answer Assessment - Initial Assessment Questions 1. CONCERN: What happened that made you call today?     Not getting out of bed 2. DEPRESSION SYMPTOM SCREENING: How are you feeling overall? (e.g., decreased energy, increased sleeping or difficulty sleeping, difficulty concentrating, feelings of sadness, guilt, hopelessness, or worthlessness)     Very depressed 3. RISK OF HARM - SUICIDAL IDEATION:  Do you ever have thoughts of hurting or killing yourself?  (e.g., yes, no, no but preoccupation with thoughts about death)     no 4. RISK OF HARM - HOMICIDAL IDEATION:  Do you ever have thoughts of hurting or killing someone else?  (e.g., yes, no, no but preoccupation with thoughts about death)     no 5. FUNCTIONAL IMPAIRMENT: How have things been going for you overall? Have you had more difficulty than usual doing your normal daily activities?  (  e.g., better, same, worse; self-care, school, work, interactions)     Not doing well - not doing much lives alone 6. SUPPORT: Who is with you now? Who do you live with? Do you have family or friends who you can talk to?      Did not ask 7. THERAPIST: Do you have a counselor or therapist? If Yes, ask: What is their name?     no 8. STRESSORS: Has there been any new stress or recent changes in your life?     no 9. ALCOHOL USE OR SUBSTANCE USE (DRUG USE): Do you drink alcohol or use any illegal drugs?     Did not ask 10. OTHER: Do you have any other physical symptoms right now? (e.g., fever)       Slipped and fell on ice 2 times last  week  Protocols used: Falls and [place], Depression-A-AH

## 2025-01-30 NOTE — Telephone Encounter (Signed)
 Unable to reach patient. LMTRC

## 2025-01-30 NOTE — Telephone Encounter (Signed)
"   Noted she is seeing somebody at our [place] office.  "

## 2025-02-02 ENCOUNTER — Ambulatory Visit: Admitting: Internal Medicine

## 2025-02-07 ENCOUNTER — Ambulatory Visit

## 2025-02-28 ENCOUNTER — Ambulatory Visit

## 2025-03-10 ENCOUNTER — Ambulatory Visit

## 2025-03-31 ENCOUNTER — Ambulatory Visit

## 2025-05-01 ENCOUNTER — Ambulatory Visit
